# Patient Record
Sex: Female | Born: 1985 | Race: White | Hispanic: No | Marital: Single | Smoking: Never smoker
Health system: Southern US, Community
[De-identification: ages and names within clinical notes are randomized; demographics above are authoritative.]

## PROBLEM LIST (undated history)

## (undated) ENCOUNTER — Emergency Department: Admission: EM | Payer: Self-pay | Source: Home / Self Care

## (undated) DIAGNOSIS — G709 Myoneural disorder, unspecified: Secondary | ICD-10-CM

## (undated) DIAGNOSIS — R0600 Dyspnea, unspecified: Secondary | ICD-10-CM

## (undated) DIAGNOSIS — R519 Headache, unspecified: Secondary | ICD-10-CM

## (undated) DIAGNOSIS — C720 Malignant neoplasm of spinal cord: Secondary | ICD-10-CM

## (undated) DIAGNOSIS — Z8041 Family history of malignant neoplasm of ovary: Secondary | ICD-10-CM

## (undated) DIAGNOSIS — O039 Complete or unspecified spontaneous abortion without complication: Secondary | ICD-10-CM

## (undated) DIAGNOSIS — F419 Anxiety disorder, unspecified: Secondary | ICD-10-CM

## (undated) DIAGNOSIS — N809 Endometriosis, unspecified: Secondary | ICD-10-CM

## (undated) DIAGNOSIS — Z6791 Unspecified blood type, Rh negative: Secondary | ICD-10-CM

## (undated) DIAGNOSIS — C719 Malignant neoplasm of brain, unspecified: Secondary | ICD-10-CM

## (undated) DIAGNOSIS — K219 Gastro-esophageal reflux disease without esophagitis: Secondary | ICD-10-CM

## (undated) DIAGNOSIS — R51 Headache: Secondary | ICD-10-CM

## (undated) DIAGNOSIS — Z1379 Encounter for other screening for genetic and chromosomal anomalies: Secondary | ICD-10-CM

## (undated) DIAGNOSIS — A6000 Herpesviral infection of urogenital system, unspecified: Secondary | ICD-10-CM

## (undated) HISTORY — DX: Gastro-esophageal reflux disease without esophagitis: K21.9

## (undated) HISTORY — DX: Complete or unspecified spontaneous abortion without complication: O03.9

## (undated) HISTORY — DX: Herpesviral infection of urogenital system, unspecified: A60.00

## (undated) HISTORY — PX: DILATION AND CURETTAGE OF UTERUS: SHX78

## (undated) HISTORY — DX: Endometriosis, unspecified: N80.9

## (undated) HISTORY — DX: Unspecified blood type, rh negative: Z67.91

## (undated) HISTORY — DX: Encounter for other screening for genetic and chromosomal anomalies: Z13.79

## (undated) HISTORY — DX: Family history of malignant neoplasm of ovary: Z80.41

## (undated) HISTORY — DX: Malignant neoplasm of brain, unspecified: C71.9

## (undated) HISTORY — PX: BRAIN TUMOR EXCISION: SHX577

## (undated) HISTORY — DX: Malignant neoplasm of spinal cord: C72.0

---
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
## 2007-02-28 ENCOUNTER — Emergency Department: Payer: Self-pay

## 2007-06-30 ENCOUNTER — Observation Stay: Payer: Self-pay

## 2007-07-01 ENCOUNTER — Ambulatory Visit: Payer: Self-pay

## 2007-07-04 ENCOUNTER — Observation Stay: Payer: Self-pay | Admitting: Obstetrics & Gynecology

## 2007-07-22 ENCOUNTER — Inpatient Hospital Stay: Payer: Self-pay | Admitting: Obstetrics and Gynecology

## 2007-08-01 ENCOUNTER — Emergency Department: Payer: Self-pay | Admitting: Emergency Medicine

## 2007-08-03 ENCOUNTER — Ambulatory Visit: Payer: Self-pay | Admitting: Obstetrics and Gynecology

## 2007-08-06 ENCOUNTER — Ambulatory Visit: Payer: Self-pay | Admitting: Obstetrics and Gynecology

## 2007-11-20 ENCOUNTER — Emergency Department: Payer: Self-pay | Admitting: Emergency Medicine

## 2007-12-07 ENCOUNTER — Emergency Department: Payer: Self-pay | Admitting: Emergency Medicine

## 2008-06-13 ENCOUNTER — Emergency Department: Payer: Self-pay | Admitting: Emergency Medicine

## 2008-09-15 ENCOUNTER — Emergency Department: Payer: Self-pay | Admitting: Emergency Medicine

## 2008-09-20 DIAGNOSIS — K219 Gastro-esophageal reflux disease without esophagitis: Secondary | ICD-10-CM

## 2008-09-20 HISTORY — DX: Gastro-esophageal reflux disease without esophagitis: K21.9

## 2009-06-14 DIAGNOSIS — L700 Acne vulgaris: Secondary | ICD-10-CM | POA: Insufficient documentation

## 2009-06-14 DIAGNOSIS — R51 Headache: Secondary | ICD-10-CM

## 2009-10-18 ENCOUNTER — Emergency Department: Payer: Self-pay | Admitting: Emergency Medicine

## 2009-11-17 ENCOUNTER — Emergency Department: Payer: Self-pay | Admitting: Emergency Medicine

## 2009-12-20 ENCOUNTER — Emergency Department: Payer: Self-pay | Admitting: Emergency Medicine

## 2010-02-09 ENCOUNTER — Emergency Department: Payer: Self-pay | Admitting: Emergency Medicine

## 2010-02-12 ENCOUNTER — Emergency Department: Payer: Self-pay | Admitting: Emergency Medicine

## 2010-02-13 ENCOUNTER — Emergency Department: Payer: Self-pay | Admitting: Internal Medicine

## 2010-04-13 ENCOUNTER — Emergency Department: Payer: Self-pay | Admitting: Emergency Medicine

## 2010-04-24 ENCOUNTER — Ambulatory Visit: Payer: Self-pay | Admitting: Obstetrics and Gynecology

## 2010-04-26 ENCOUNTER — Ambulatory Visit: Payer: Self-pay | Admitting: Obstetrics and Gynecology

## 2010-04-26 HISTORY — PX: DIAGNOSTIC LAPAROSCOPY: SUR761

## 2010-04-26 HISTORY — PX: HYSTEROSCOPY: SHX211

## 2010-06-14 ENCOUNTER — Emergency Department: Payer: Self-pay | Admitting: Emergency Medicine

## 2010-06-25 ENCOUNTER — Emergency Department: Payer: Self-pay | Admitting: Emergency Medicine

## 2010-06-30 ENCOUNTER — Emergency Department: Payer: Self-pay | Admitting: Emergency Medicine

## 2010-07-21 ENCOUNTER — Emergency Department: Payer: Self-pay | Admitting: Emergency Medicine

## 2010-08-04 ENCOUNTER — Emergency Department: Payer: Self-pay | Admitting: Emergency Medicine

## 2010-08-08 ENCOUNTER — Other Ambulatory Visit: Payer: Self-pay | Admitting: Obstetrics and Gynecology

## 2010-08-10 ENCOUNTER — Ambulatory Visit: Payer: Self-pay

## 2010-09-03 LAB — PATHOLOGY REPORT

## 2010-09-26 ENCOUNTER — Emergency Department: Payer: Self-pay | Admitting: Emergency Medicine

## 2010-09-28 ENCOUNTER — Emergency Department: Payer: Self-pay | Admitting: Emergency Medicine

## 2010-11-16 ENCOUNTER — Emergency Department: Payer: Self-pay | Admitting: Emergency Medicine

## 2010-12-05 ENCOUNTER — Emergency Department: Payer: Self-pay | Admitting: Emergency Medicine

## 2011-01-11 ENCOUNTER — Emergency Department: Payer: Self-pay | Admitting: Emergency Medicine

## 2011-03-15 ENCOUNTER — Emergency Department: Payer: Self-pay | Admitting: Emergency Medicine

## 2011-03-27 ENCOUNTER — Emergency Department: Payer: Self-pay | Admitting: Emergency Medicine

## 2011-05-26 ENCOUNTER — Emergency Department: Payer: Self-pay | Admitting: Emergency Medicine

## 2011-06-06 ENCOUNTER — Emergency Department: Payer: Self-pay | Admitting: Emergency Medicine

## 2011-07-02 ENCOUNTER — Emergency Department: Payer: Self-pay | Admitting: Emergency Medicine

## 2011-07-22 ENCOUNTER — Inpatient Hospital Stay: Payer: Self-pay | Admitting: Surgery

## 2011-07-27 ENCOUNTER — Emergency Department: Payer: Self-pay | Admitting: *Deleted

## 2011-08-26 ENCOUNTER — Emergency Department: Payer: Self-pay | Admitting: Emergency Medicine

## 2011-09-17 ENCOUNTER — Observation Stay: Payer: Self-pay

## 2011-10-27 ENCOUNTER — Observation Stay: Payer: Self-pay | Admitting: Obstetrics and Gynecology

## 2011-11-03 ENCOUNTER — Observation Stay: Payer: Self-pay | Admitting: Obstetrics and Gynecology

## 2011-11-04 ENCOUNTER — Observation Stay: Payer: Self-pay

## 2011-11-04 ENCOUNTER — Inpatient Hospital Stay (HOSPITAL_COMMUNITY): Payer: Medicaid Other

## 2011-11-04 ENCOUNTER — Encounter (HOSPITAL_COMMUNITY): Payer: Self-pay | Admitting: *Deleted

## 2011-11-04 ENCOUNTER — Observation Stay (HOSPITAL_COMMUNITY)
Admission: AD | Admit: 2011-11-04 | Discharge: 2011-11-05 | Disposition: A | Discharge status: Home / Self Care | Payer: Medicaid Other | Source: Ambulatory Visit | Attending: Obstetrics & Gynecology | Admitting: Obstetrics & Gynecology

## 2011-11-04 DIAGNOSIS — O4290 Premature rupture of membranes, unspecified as to length of time between rupture and onset of labor, unspecified weeks of gestation: Principal | ICD-10-CM | POA: Insufficient documentation

## 2011-11-04 DIAGNOSIS — Z22330 Carrier of Group B streptococcus: Secondary | ICD-10-CM | POA: Insufficient documentation

## 2011-11-04 DIAGNOSIS — O4700 False labor before 37 completed weeks of gestation, unspecified trimester: Secondary | ICD-10-CM | POA: Insufficient documentation

## 2011-11-04 DIAGNOSIS — O99891 Other specified diseases and conditions complicating pregnancy: Secondary | ICD-10-CM | POA: Insufficient documentation

## 2011-11-04 DIAGNOSIS — O42919 Preterm premature rupture of membranes, unspecified as to length of time between rupture and onset of labor, unspecified trimester: Secondary | ICD-10-CM

## 2011-11-04 DIAGNOSIS — O9989 Other specified diseases and conditions complicating pregnancy, childbirth and the puerperium: Secondary | ICD-10-CM

## 2011-11-04 LAB — AMNISURE RUPTURE OF MEMBRANE (ROM) NOT AT ARMC: Amnisure ROM: NEGATIVE

## 2011-11-04 LAB — HIV ANTIBODY (ROUTINE TESTING W REFLEX): HIV: NONREACTIVE

## 2011-11-04 LAB — RPR: RPR: NONREACTIVE

## 2011-11-04 LAB — HEPATITIS B SURFACE ANTIGEN: Hepatitis B Surface Ag: NEGATIVE

## 2011-11-04 LAB — RUBELLA ANTIBODY, IGM: Rubella: IMMUNE

## 2011-11-04 MED ORDER — CALCIUM CARBONATE ANTACID 500 MG PO CHEW: 2.0000 | CHEWABLE_TABLET | ORAL | Status: DC | PRN

## 2011-11-04 MED ORDER — ACETAMINOPHEN 325 MG PO TABS: 650.0000 mg | ORAL_TABLET | ORAL | Status: DC | PRN

## 2011-11-04 MED ORDER — SODIUM CHLORIDE 0.9 % IV SOLN
2.0000 g | Freq: Four times a day (QID) | INTRAVENOUS | Status: DC
  Administered 2011-11-04 – 2011-11-05 (×4): 2 g via INTRAVENOUS
  Filled 2011-11-04 (×6): qty 2000

## 2011-11-04 MED ORDER — ZOLPIDEM TARTRATE 10 MG PO TABS: 10.0000 mg | ORAL_TABLET | Freq: Every evening | ORAL | Status: DC | PRN

## 2011-11-04 MED ORDER — DOCUSATE SODIUM 100 MG PO CAPS
100.0000 mg | ORAL_CAPSULE | Freq: Every day | ORAL | Status: DC
  Administered 2011-11-04 – 2011-11-05 (×2): 100 mg via ORAL
  Filled 2011-11-04 (×2): qty 1

## 2011-11-04 MED ORDER — PRENATAL PLUS 27-1 MG PO TABS: 1.0000 | ORAL_TABLET | Freq: Every day | ORAL | Status: DC

## 2011-11-04 MED ORDER — LACTATED RINGERS IV SOLN
INTRAVENOUS | Status: DC
  Administered 2011-11-04 – 2011-11-05 (×3): via INTRAVENOUS

## 2011-11-04 MED ORDER — BETAMETHASONE SOD PHOS & ACET 6 (3-3) MG/ML IJ SUSP: 12.0000 mg | INTRAMUSCULAR | Status: DC

## 2011-11-04 MED ORDER — AMOXICILLIN 500 MG PO CAPS: 500.0000 mg | ORAL_CAPSULE | Freq: Three times a day (TID) | ORAL | Status: DC

## 2011-11-04 MED ORDER — BETAMETHASONE SOD PHOS & ACET 6 (3-3) MG/ML IJ SUSP
12.0000 mg | Freq: Once | INTRAMUSCULAR | Status: AC
  Administered 2011-11-05: 12 mg via INTRAMUSCULAR
  Filled 2011-11-04: qty 2

## 2011-11-04 MED ORDER — ERYTHROMYCIN BASE 250 MG PO TABS: 250.0000 mg | ORAL_TABLET | Freq: Four times a day (QID) | ORAL | Status: DC

## 2011-11-04 MED ORDER — SODIUM CHLORIDE 0.9 % IV SOLN
250.0000 mg | Freq: Four times a day (QID) | INTRAVENOUS | Status: DC
  Administered 2011-11-04 – 2011-11-05 (×4): 250 mg via INTRAVENOUS
  Filled 2011-11-04 (×8): qty 250

## 2011-11-04 MED ORDER — DOCUSATE SODIUM 100 MG PO CAPS: 100.0000 mg | ORAL_CAPSULE | Freq: Every day | ORAL | Status: DC

## 2011-11-04 MED ORDER — MAGNESIUM SULFATE 40 G IN LACTATED RINGERS - SIMPLE
2.0000 g/h | INTRAVENOUS | Status: AC
  Administered 2011-11-04: 2 g/h via INTRAVENOUS
  Filled 2011-11-04: qty 500

## 2011-11-04 MED ORDER — PRENATAL PLUS 27-1 MG PO TABS
1.0000 | ORAL_TABLET | Freq: Every day | ORAL | Status: DC
  Administered 2011-11-04 – 2011-11-05 (×2): 1 via ORAL
  Filled 2011-11-04 (×2): qty 1

## 2011-11-04 NOTE — Progress Notes (Signed)
Pt states contractions are becoming more frequent and stronger. Unable to palpate contractions. Toco readjusted. Dr Durene Cal notified and made aware. Stated he would check on pt later.

## 2011-11-04 NOTE — Progress Notes (Signed)
IV removed per pt request, pt asking for a new IV site.

## 2011-11-04 NOTE — Progress Notes (Signed)
Updated on [place] results

## 2011-11-04 NOTE — Plan of Care (Signed)
Problem: Consults Goal: Birthing Suites Patient Information Press F2 to bring up selections list Outcome: Completed/Met Date Met:  11/04/11  Pt < [redacted] weeks EGA
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
## 2011-11-04 NOTE — Progress Notes (Signed)
Patient ID: BELKY MUNDO, female   DOB: 1985-12-09, 25 y.o.   MRN: [id number] FACULTY PRACTICE ANTEPARTUM(COMPREHENSIVE) NOTE  Denise Bryan is a 25 y.o. [id number] at [redacted]w[redacted]d by early ultrasound who is admitted for PROM.   Fetal presentation is cephalic. Length of Stay:  0  Days  Subjective: Occcasional mucus of watery discharge, no contractions Patient reports the fetal movement as active. Patient reports uterine contraction  activity as none. Patient reports  vaginal bleeding as none. Patient describes fluid per vagina as Clear, mucus  Vitals:  Blood pressure 103/62, pulse 107, temperature 98.5 F (36.9 C), temperature source Oral, resp. rate 20, height 5\' 3"  (1.6 m), weight 152 lb (68.947 kg), SpO2 96.00%. Physical Examination:  General appearance - alert, well appearing, and in no distress Heart - normal rate and regular rhythm Abdomen - soft, nontender, nondistended Fundal Height:  size equals dates Cervical Exam: Evaluated by sterile speculum exam. and found to be <0.5 cm/ Long/not palpated, cephalic by [place] and fetal presentation is cephalic. Extremities: extremities normal, atraumatic, no cyanosis or edema and Homans sign is negative, no sign of DVT with DTRs 2+ bilaterally Membranes:intact, sterile speculum exam reported neg fern, neg amnisure, no pool, white mucus seen  Fetal Monitoring:  Baseline: 150 bpm and no decel  Labs:  Recent Results (from the past 24 hour(s))  ANTIBODY SCREEN      Component Value Range   Antibody Screen Negative    HIV ANTIBODY (ROUTINE TESTING)      Component Value Range   HIV Non-reactive    ABO/RH      Component Value Range   RH-Type Negative     ABO Grouping B    STREP B DNA PROBE      Component Value Range   Group B Strep Ag Positive    RUBELLA ANTIBODY, IGM      Component Value Range   Rubella Immune    HEPATITIS B SURFACE ANTIGEN      Component Value Range   Hepatitis B Surface Ag Negative    RPR      Component Value  Range   RPR Nonreactive    AMNISURE RUPTURE OF MEMBRANE (ROM)   Collection Time   11/04/11  7:45 PM      Component Value Range   Amnisure ROM NEGATIVE      Imaging Studies:    Normal afv, cephalic, cervix closed, 3.8 cm length Medications:  Scheduled    . ampicillin (OMNIPEN) IV  2 g Intravenous Q6H   Followed by  . amoxicillin  500 mg Oral Q8H  . betamethasone acetate-betamethasone sodium phosphate  12 mg Intramuscular Once  . docusate sodium  100 mg Oral Daily  . erythromycin  250 mg Intravenous Q6H   Followed by  . erythromycin  250 mg Oral Q6H  . prenatal vitamin w/FE, FA  1 tablet Oral Daily  . DISCONTD: betamethasone acetate-betamethasone sodium phosphate  12 mg Intramuscular Q24H  . DISCONTD: docusate sodium  100 mg Oral Daily  . DISCONTD: prenatal vitamin w/FE, FA  1 tablet Oral Daily   I have reviewed the patient's current medications.  ASSESSMENT: Pt referred for evaluation of possible preterm premature ROM, with testing that suggest membranes are intact  PLAN: If no further suspicion of ROM in am consider d/c abx, magnesium. Will complete second dose of betamethasone.   [name] 11/04/2011,8:55 PM

## 2011-11-05 NOTE — Discharge Summary (Signed)
  Physician Discharge Summary  Patient ID: Denise Bryan MRN: [id number] DOB/AGE: [birth year] 25 y.o.  Admit date: 11/04/2011 Discharge date: 11/05/2011  Admission Diagnoses: ?PPROM  Discharge Diagnoses:  Active Problems:  * No active hospital problems. *  PPROM ruled out.   Discharged Condition: good  Hospital Course: Pt was transferred from an outside hospital with suspicion of rupture of membranes.  She reported contractions.  She was initially monitored on continuous tocometer, continued on magnesium (which had been started at OSH) and given a course of antibiotics. She was evaluated by sterile speculum exam. and found to be <0.5 cm/ Long/not palpated, cephalic by [place] and fetal presentation is cephalic with membranes intact, sterile speculum exam reported neg fern, neg amnisure, no pool, white mucus seen.  Once it was determined that that pt did not have ruptured membranes, continuous toco was stopped.  She was monitored intermittently from that time until discharge.  Pt did well,  And was discharged home with instructions to f/u with primary OB this week.    Consults: none  Significant Diagnostic Studies: Complete US with AFI - cervical length of 3.87cm, single living intrauterine gestation with concordant gestational age and normal visualize anatomy.  Normal amniotic fluid volume (18.1cm, 69%tile).    Treatments: antibiotics: amoxicillin, ampicillin;  Tocolytics: Magnesiumx12hrs;  Betamethasone x1 (to complete course started at outside hospital)  Discharge Exam: Blood pressure 94/49, pulse 102, temperature 98.5 F (36.9 C), temperature source Oral, resp. rate 18, height 5\' 3"  (1.6 m), weight 68.947 kg (152 lb), SpO2 96.00%.   Disposition: Discharged home  Discharge Orders    Future Orders Please Complete By Expires   Discharge patient      Strep B DNA probe      Comments:   This external order was created through the Results Console.   HIV antibody      Comments:    This external order was created through the Results Console.   Rubella antibody, IgM      Comments:   This external order was created through the Results Console.   Hepatitis B surface antigen      Comments:   This external order was created through the Results Console.   RPR      Comments:   This external order was created through the Results Console.   Antibody screen      Comments:   This external order was created through the Results Console.   ABO/Rh      Comments:   This external order was created through the Results Console.     Medication List  As of 11/05/2011  5:20 PM   CONTINUE taking these medications         acetaminophen 500 MG tablet   Commonly known as: TYLENOL      prenatal vitamin w/FE, FA 27-1 MG Tabs           Follow-up Information    Follow up with [facility] on 11/07/2011. (Please call or return to care earlier  as needed)          Signed: Levada Schilling 11/05/2011, 5:20 PM  Discussed with Philipp Deputy, CNM

## 2011-11-05 NOTE — Progress Notes (Signed)
Patient ID: Denise Bryan, female   DOB: 07-Feb-1986, 25 y.o.   MRN: [id number] FACULTY PRACTICE ANTEPARTUM(COMPREHENSIVE) NOTE  Denise Bryan is a 25 y.o. [id number] at [redacted]w[redacted]d by early ultrasound who is admitted for PROM.   Fetal presentation is cephalic. Length of Stay:  1  Days  Subjective: Occasional contractions Patient reports the fetal movement as active. Patient reports uterine contraction  activity as irregular. Patient reports  vaginal bleeding as none. Patient describes fluid per vagina as None.  Vitals:  Blood pressure 99/60, pulse 100, temperature 97.8 F (36.6 C), temperature source Oral, resp. rate 18, height 5\' 3"  (1.6 m), weight 152 lb (68.947 kg), SpO2 96.00%. Physical Examination:  General appearance - alert, well appearing, and in no distress Heart - normal rate and regular rhythm Abdomen - soft, nontender, nondistended Fundal Height:  size equals dates Cervical Exam: Not evaluated. and found to be not evaluated/ not checked/not checked and fetal presentation is cephalic. Extremities: extremities normal, atraumatic, no cyanosis or edema and Homans sign is negative, no sign of DVT with DTRs 2+ bilaterally Membranes:intact  Fetal Monitoring:  Baseline: 125-135 bpm  Labs:  Recent Results (from the past 24 hour(s))  AMNISURE RUPTURE OF MEMBRANE (ROM)   Collection Time   11/04/11  7:45 PM      Component Value Range   Amnisure ROM NEGATIVE      Imaging Studies:    [place] reviewed. Currently EPIC will not allow sonographic studies to automatically populate into notes.  In the meantime, copy and paste results into note or free text.  Medications:  Scheduled    . ampicillin (OMNIPEN) IV  2 g Intravenous Q6H   Followed by  . amoxicillin  500 mg Oral Q8H  . betamethasone acetate-betamethasone sodium phosphate  12 mg Intramuscular Once  . docusate sodium  100 mg Oral Daily  . erythromycin  250 mg Intravenous Q6H   Followed by  . erythromycin  250 mg Oral Q6H    . prenatal vitamin w/FE, FA  1 tablet Oral Daily  . DISCONTD: betamethasone acetate-betamethasone sodium phosphate  12 mg Intramuscular Q24H  . DISCONTD: docusate sodium  100 mg Oral Daily  . DISCONTD: prenatal vitamin w/FE, FA  1 tablet Oral Daily   I have reviewed the patient's current medications.  ASSESSMENT: Patient Active Problem List  Diagnoses  . Preterm premature rupture of membranes   No current evidence of ROM2 PLAN: Discontinue magnesium IV  ARNOLD,JAMES 11/05/2011,6:52 AM

## 2011-11-07 ENCOUNTER — Observation Stay: Payer: Self-pay

## 2011-11-07 NOTE — H&P (Signed)
Agree with note by the resident

## 2011-11-07 NOTE — Discharge Summary (Signed)
Attestation of Attending Supervision of Resident: Evaluation and management procedures were performed by the [facility] Resident under my supervision.  I have reviewed the resident's note, chart reviewed and agree with management and plan.  Jaynie Collins, M.D. 11/07/2011 11:32 AM

## 2011-11-17 ENCOUNTER — Observation Stay: Payer: Self-pay | Admitting: Obstetrics & Gynecology

## 2011-11-26 ENCOUNTER — Observation Stay: Payer: Self-pay | Admitting: Obstetrics and Gynecology

## 2011-11-27 ENCOUNTER — Observation Stay: Payer: Self-pay

## 2011-12-01 ENCOUNTER — Observation Stay: Payer: Self-pay | Admitting: *Deleted

## 2011-12-16 ENCOUNTER — Inpatient Hospital Stay: Payer: Self-pay

## 2012-01-03 ENCOUNTER — Observation Stay: Payer: Self-pay | Admitting: Obstetrics and Gynecology

## 2012-01-07 ENCOUNTER — Observation Stay: Payer: Self-pay

## 2012-01-11 ENCOUNTER — Observation Stay: Payer: Self-pay

## 2012-01-17 HISTORY — PX: TUBAL LIGATION: SHX77

## 2012-01-21 ENCOUNTER — Inpatient Hospital Stay: Payer: Self-pay | Admitting: Obstetrics and Gynecology

## 2012-01-21 LAB — CBC WITH DIFFERENTIAL/PLATELET
Basophil #: 0 10*3/uL (ref 0.0–0.1)
Basophil %: 0.1 %
Eosinophil #: 0.1 10*3/uL (ref 0.0–0.7)
Eosinophil %: 0.7 %
Lymphocyte #: 1.5 10*3/uL (ref 1.0–3.6)
Lymphocyte %: 20.6 %
MCH: 32.5 pg (ref 26.0–34.0)
MCHC: 33.8 g/dL (ref 32.0–36.0)
MCV: 96 fL (ref 80–100)
Monocyte #: 0.5 10*3/uL (ref 0.0–0.7)
Platelet: 123 10*3/uL — ABNORMAL LOW (ref 150–440)
RBC: 3.64 10*6/uL — ABNORMAL LOW (ref 3.80–5.20)
RDW: 12.4 % (ref 11.5–14.5)

## 2012-01-22 LAB — HEMATOCRIT: HCT: 36.3 % (ref 35.0–47.0)

## 2012-01-24 LAB — PATHOLOGY REPORT

## 2012-02-17 ENCOUNTER — Emergency Department: Payer: Self-pay | Admitting: *Deleted

## 2012-03-12 ENCOUNTER — Emergency Department: Payer: Self-pay | Admitting: Internal Medicine

## 2012-04-02 DIAGNOSIS — S139XXA Sprain of joints and ligaments of unspecified parts of neck, initial encounter: Secondary | ICD-10-CM | POA: Insufficient documentation

## 2012-08-16 IMAGING — CR LEFT WRIST - COMPLETE 3+ VIEW
1 series · 4 of 4 positions shown · non-contrast
Comparison: none

REASON FOR EXAM: wrist pain
COMMENTS:   May transport without cardiac monitor

PROCEDURE:     DXR - DXR WRIST LT COMP WITH OBLIQUES  - September 26, 2010  [DATE]
RESULT:     No fracture, dislocation or other acute bony abnormality is
identified.

[Series 1: view not recorded · 0.17mm/px · 4 of 4 slices shown]
[im 1/4]
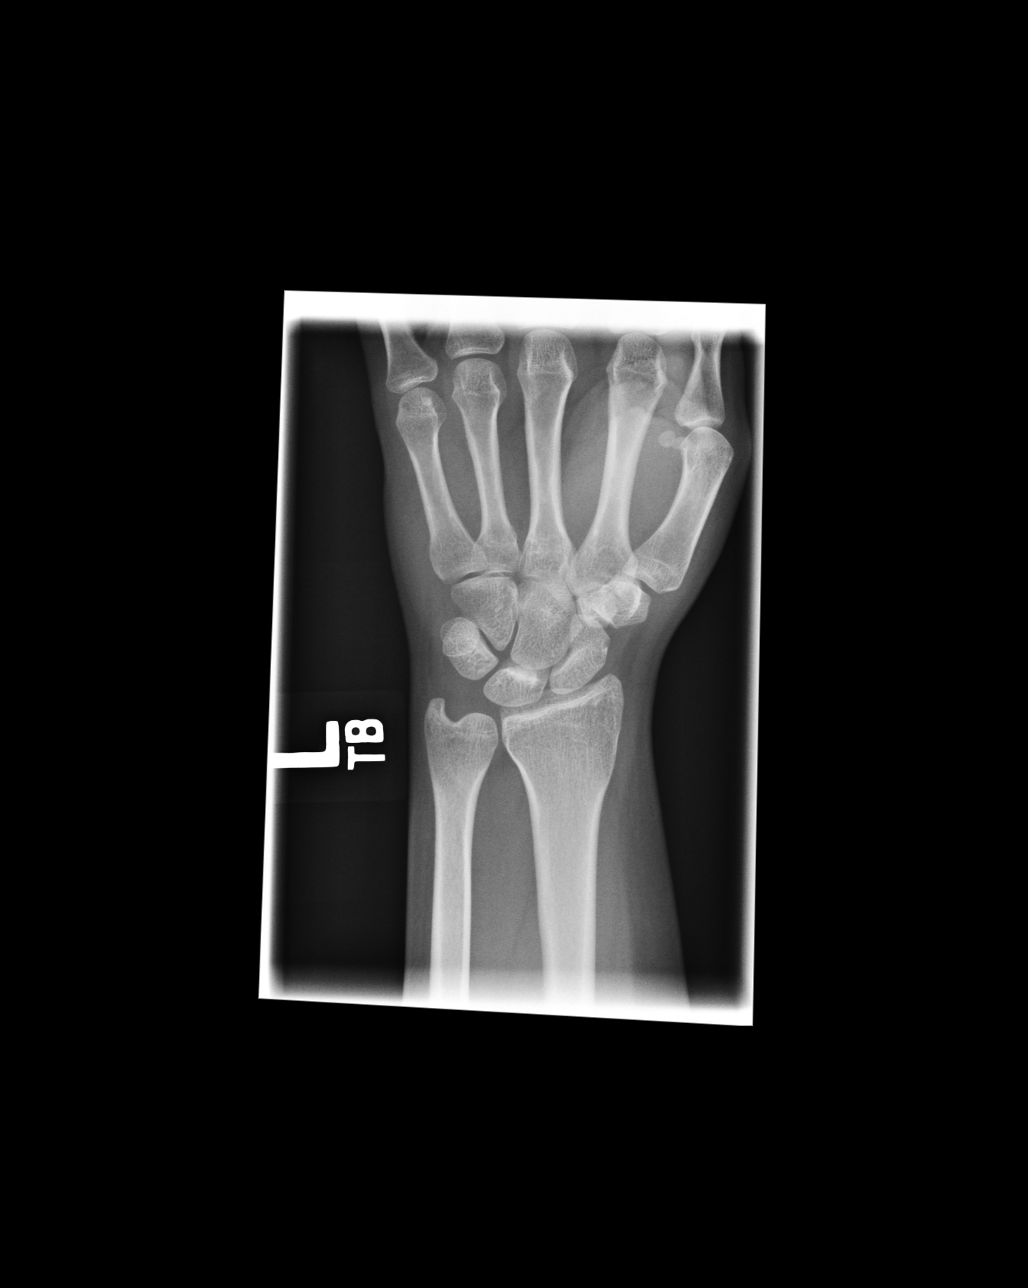
[im 2/4]
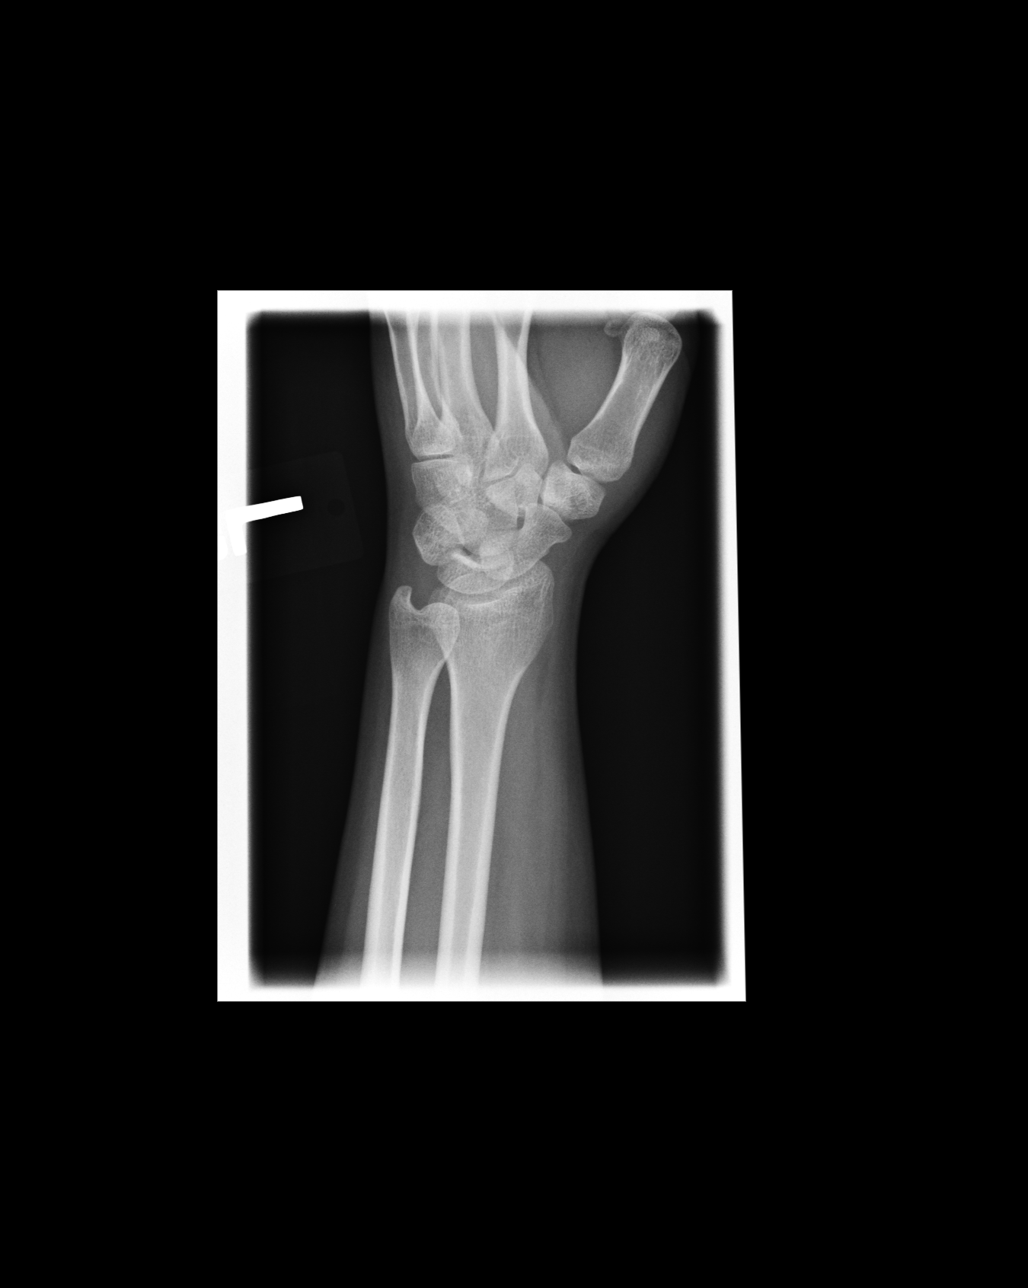
[im 3/4]
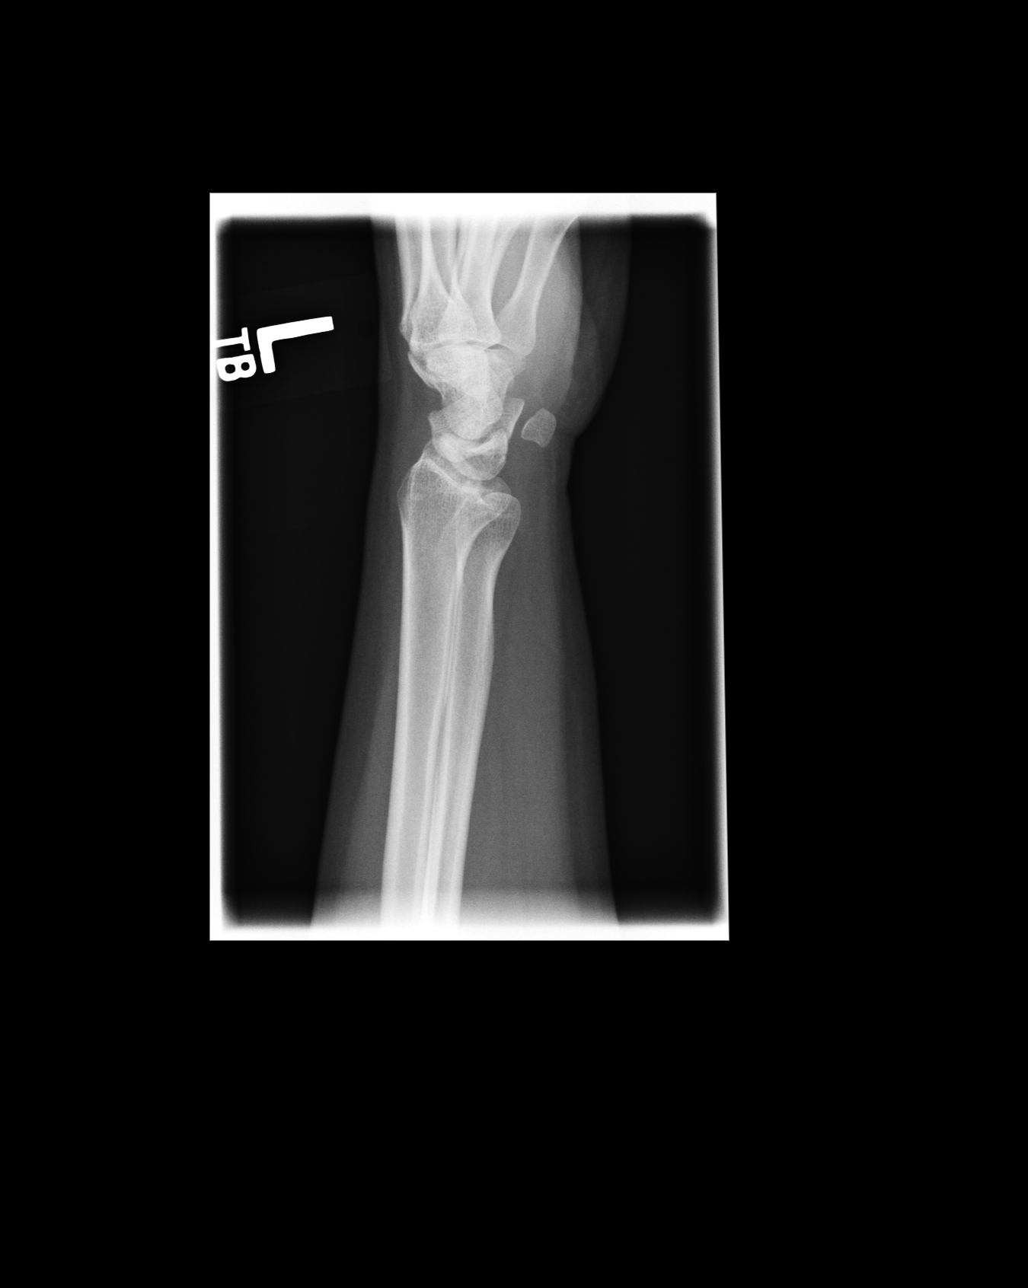
[im 4/4]
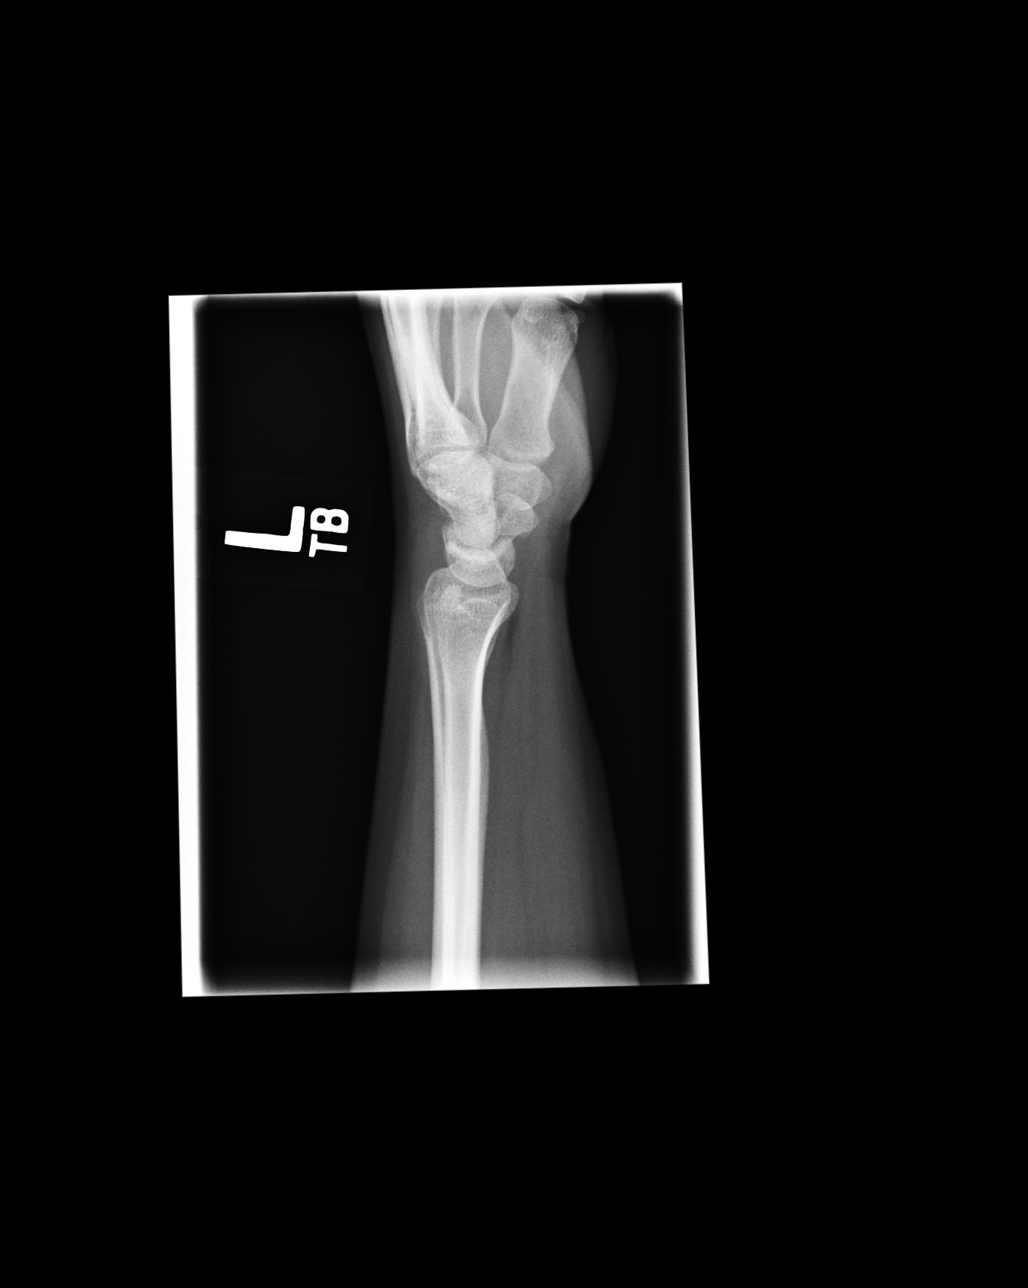

[4 of 4 positions shown; findings below may reference images not displayed]

IMPRESSION: 1.     No significant osseous abnormalities are noted.

## 2012-12-17 ENCOUNTER — Emergency Department: Payer: Self-pay | Admitting: Emergency Medicine

## 2012-12-22 ENCOUNTER — Emergency Department: Payer: Self-pay | Admitting: Emergency Medicine

## 2012-12-22 LAB — COMPREHENSIVE METABOLIC PANEL
Albumin: 4.5 g/dL (ref 3.4–5.0)
Alkaline Phosphatase: 105 U/L (ref 50–136)
Anion Gap: 5 — ABNORMAL LOW (ref 7–16)
BUN: 10 mg/dL (ref 7–18)
Calcium, Total: 9.1 mg/dL (ref 8.5–10.1)
Co2: 27 mmol/L (ref 21–32)
Creatinine: 0.52 mg/dL — ABNORMAL LOW (ref 0.60–1.30)
EGFR (African American): >60
EGFR (Non-African Amer.): >60
Osmolality: 272 (ref 275–301)
Potassium: 4.1 mmol/L (ref 3.5–5.1)
SGOT(AST): 24 U/L (ref 15–37)
SGPT (ALT): 30 U/L (ref 12–78)
Sodium: 137 mmol/L (ref 136–145)
Total Protein: 8 g/dL (ref 6.4–8.2)

## 2012-12-22 LAB — URINALYSIS, COMPLETE
Bacteria: NONE SEEN
Bilirubin,UR: NEGATIVE
Glucose,UR: NEGATIVE mg/dL (ref 0–75)
Ketone: NEGATIVE
Specific Gravity: 1.016 (ref 1.003–1.030)
Squamous Epithelial: 7

## 2012-12-22 LAB — CBC
HCT: 41.3 % (ref 35.0–47.0)
HGB: 14.5 g/dL (ref 12.0–16.0)
MCV: 89 fL (ref 80–100)
Platelet: 328 10*3/uL (ref 150–440)
RDW: 12.7 % (ref 11.5–14.5)
WBC: 10.5 10*3/uL (ref 3.6–11.0)

## 2012-12-22 LAB — LIPASE, BLOOD: Lipase: 142 U/L (ref 73–393)

## 2012-12-23 LAB — URINE CULTURE

## 2013-04-15 ENCOUNTER — Emergency Department: Payer: Self-pay | Admitting: Emergency Medicine

## 2013-06-11 IMAGING — US ABDOMEN ULTRASOUND
1 series · 17 of 25 positions shown · non-contrast
Comparison: none

REASON FOR EXAM: RUQ pain
COMMENTS:   May transport without cardiac monitor

PROCEDURE:     US  - US ABDOMEN GENERAL SURVEY  - July 22, 2011  [DATE]
RESULT:     Comparison: None
TECHNIQUE: Multiple gray-scale and color-flow Doppler images of the abdomen
are presented for review.

[Series 1: abdomen ultrasound · 17 of 64 slices shown]
[im 1/64]
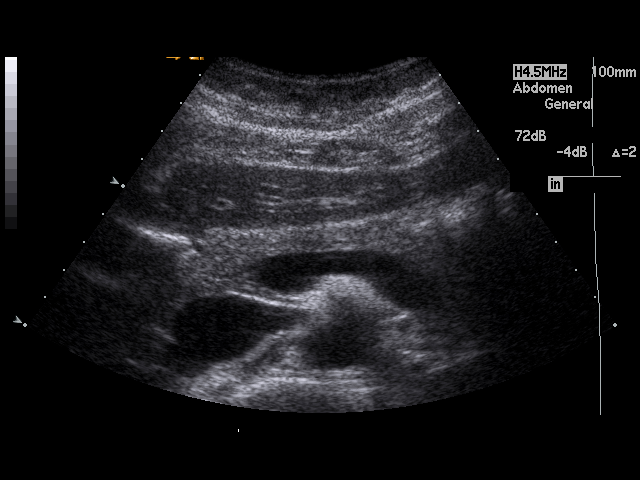
[im 6/64]
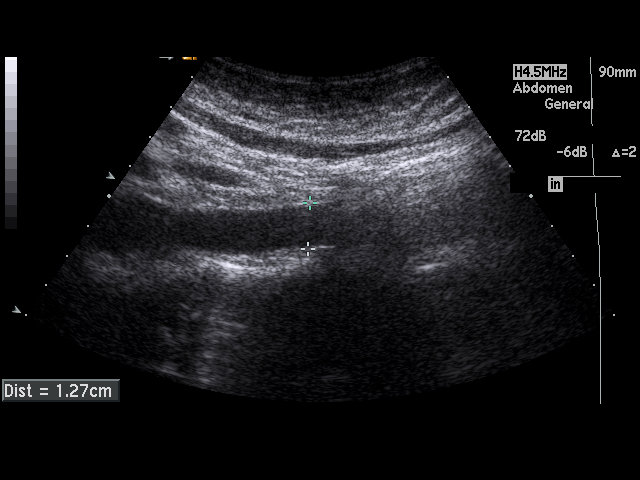
[im 8/64]
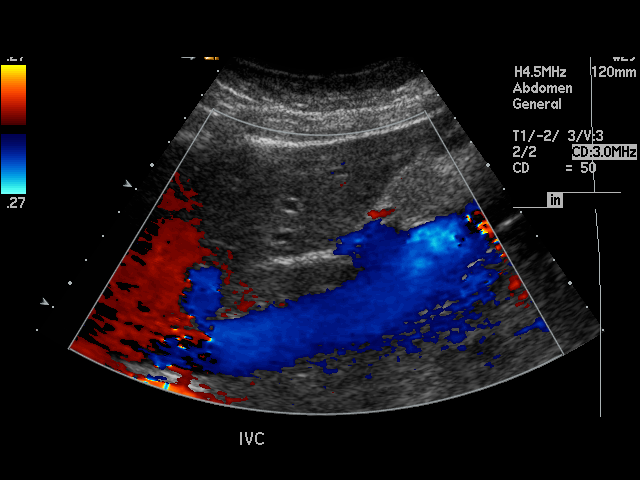
[im 14/64]
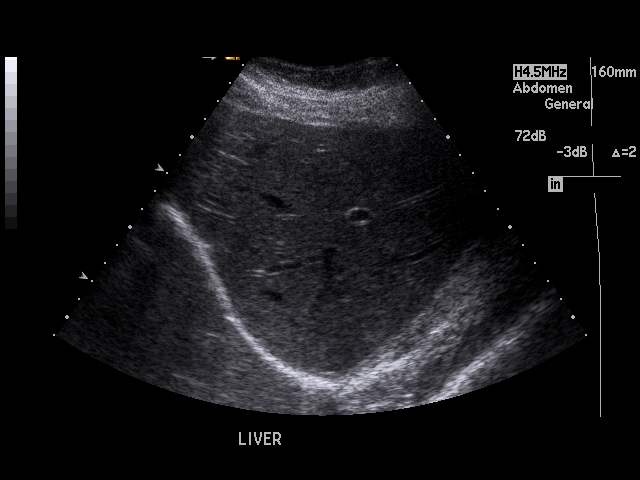
[im 16/64]
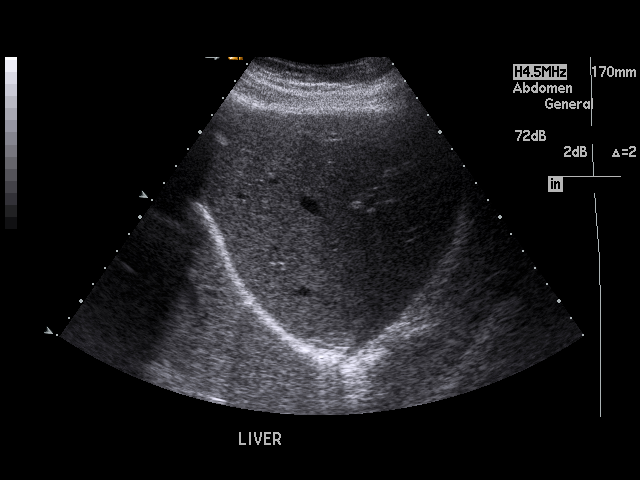
[im 22/64]
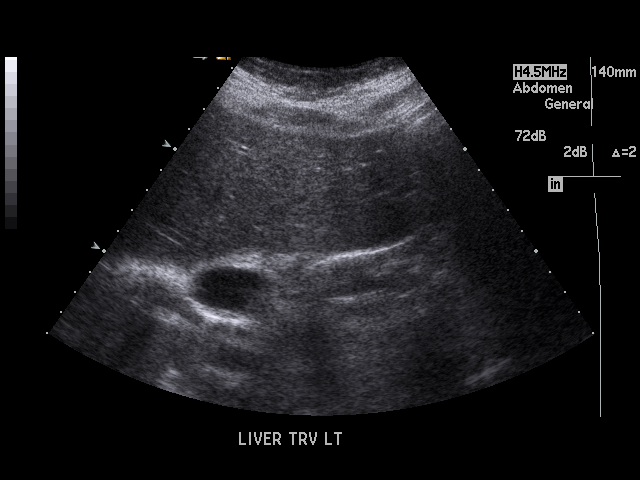
[im 24/64]
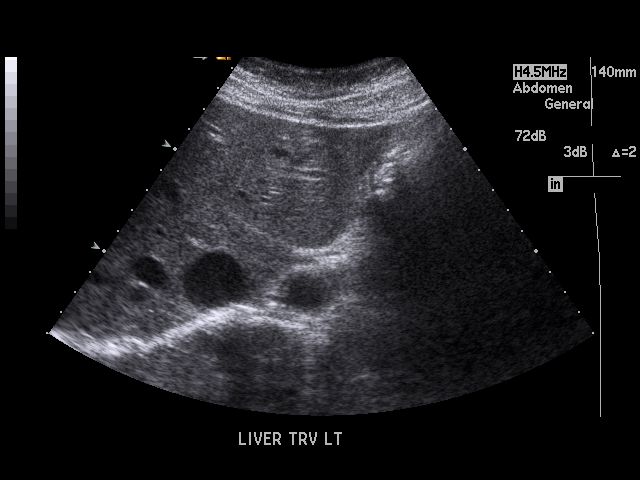
[im 29/64]
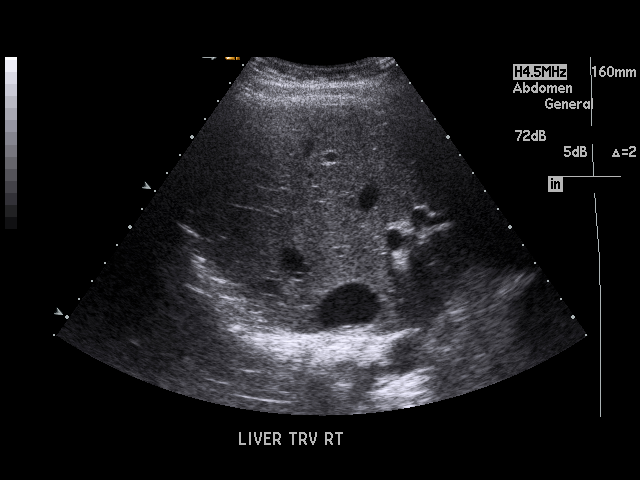
[im 32/64]
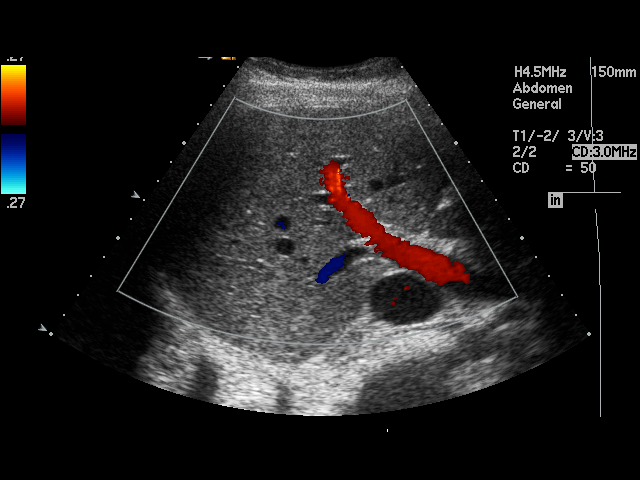
[im 35/64]
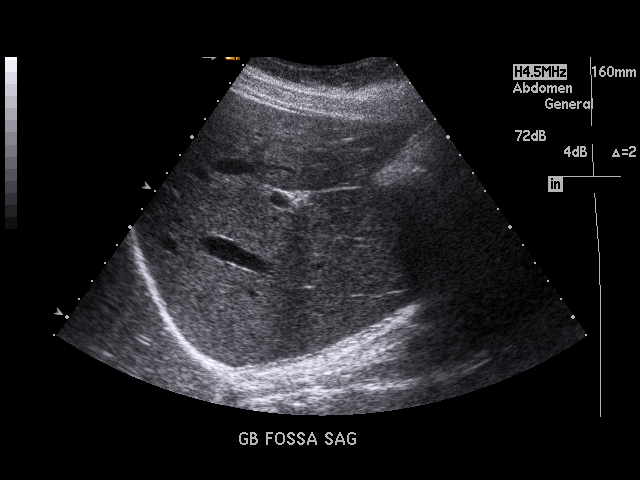
[im 40/64]
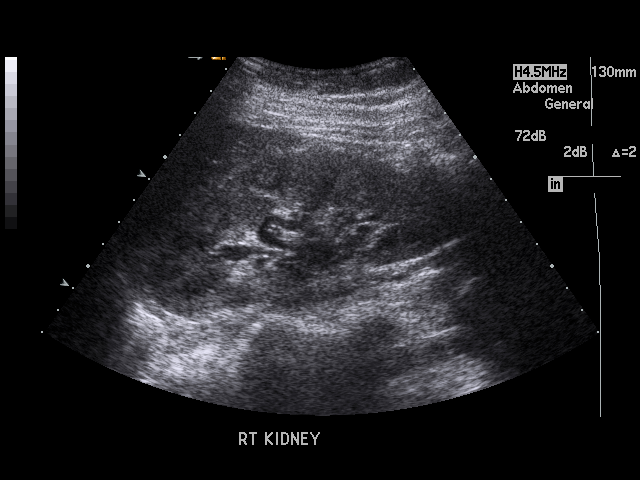
[im 43/64]
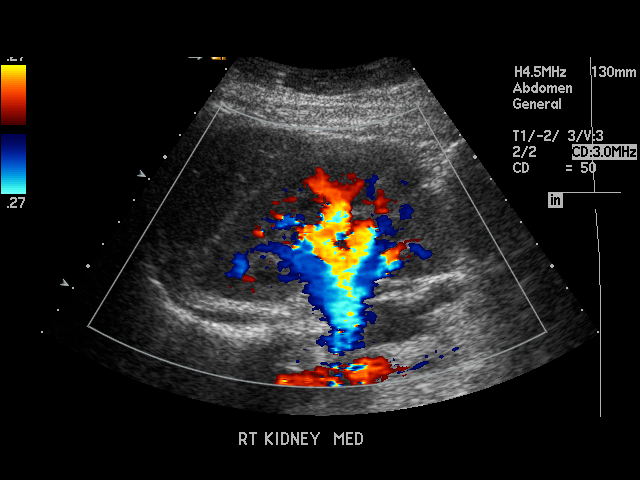
[im 48/64]
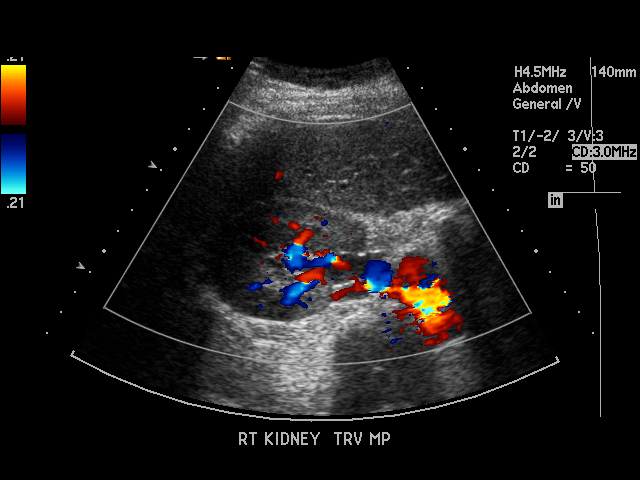
[im 50/64]
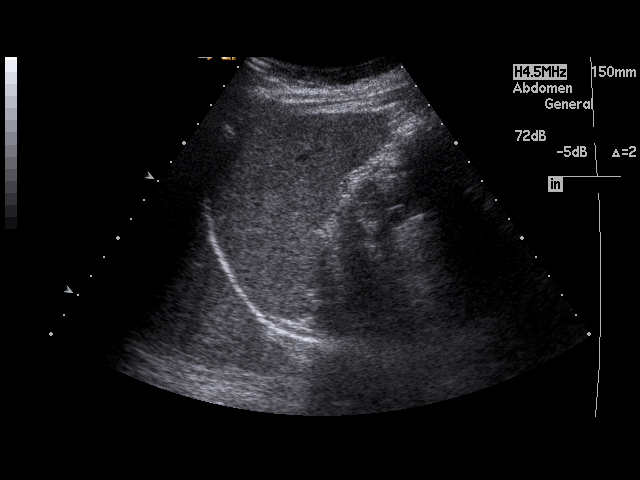
[im 56/64]
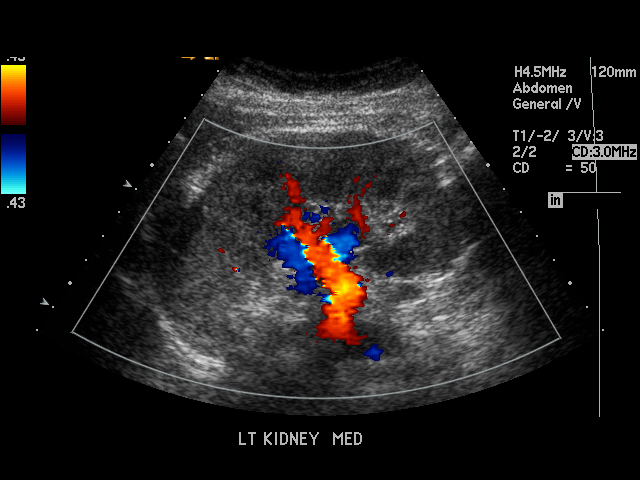
[im 58/64]
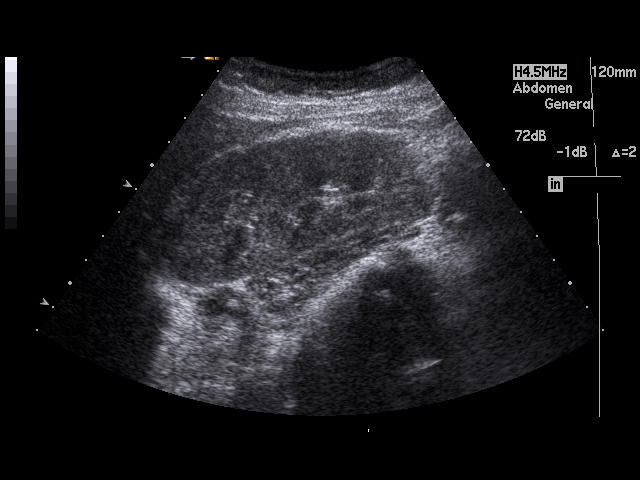
[im 64/64]
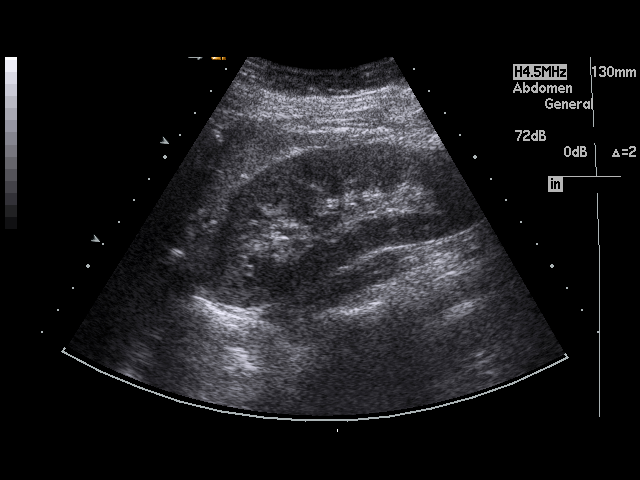

[17 of 25 positions shown; findings below may reference images not displayed]

FINDINGS: Visualized portions of the liver demonstrate normal echogenicity and normal
contours. The liver is without evidence of a focal hepatic lesion.

There is evidence of prior cholecystectomy. There is no intra- or
extrahepatic biliary ductal dilatation. The common duct measures 5.9 mm in
maximal diameter.

The visualized portion of the pancreas is normal in echogenicity. The spleen
is unremarkable. Bilateral kidneys are normal in echogenicity and size. The
right kidney measures 12.6 x 5.7 x 5.1 cm. The left kidney measures 12.4 x
4.6 x 5.2 cm. There are no renal calculi or hydronephrosis. The abdominal
aorta and IVC are unremarkable.
IMPRESSION: Prior cholecystectomy. Otherwise unremarkable abdominal ultrasound.

## 2013-06-12 IMAGING — US ABDOMEN ULTRASOUND LIMITED
1 series · 5 of 5 positions shown · non-contrast
Comparison: none

REASON FOR EXAM: Appendicitis
COMMENTS:

PROCEDURE:     US  - US ABDOMEN LIMITED SURVEY  - July 23, 2011 [DATE]
RESULT:     Comparison: 07/22/2011
TECHNIQUE: Multiple grayscale and color Doppler images obtained of the right
lower quadrant.

[Series 1: abdomen ultrasound limited · 5 of 5 slices shown]
[im 1/5]
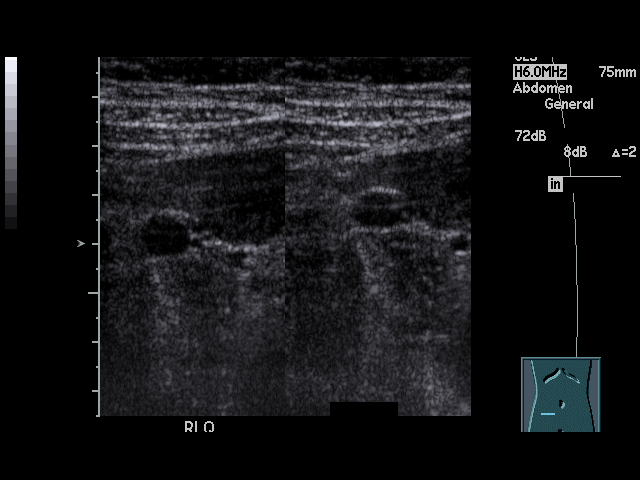
[im 2/5]
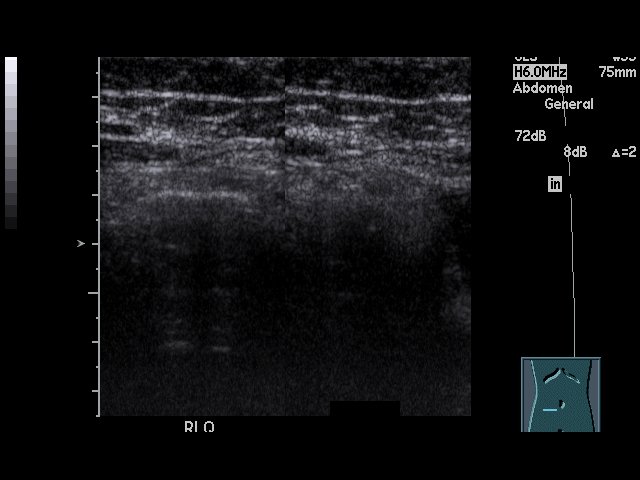
[im 3/5]
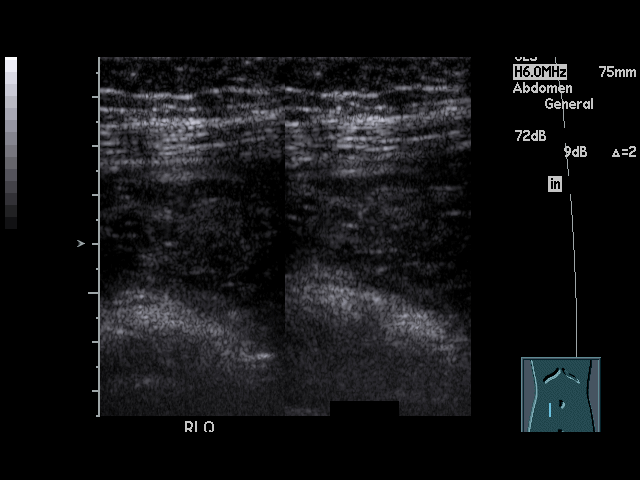
[im 4/5]
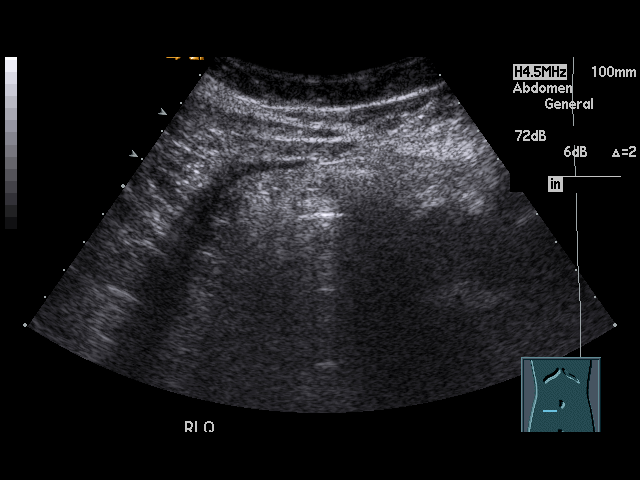
[im 5/5]
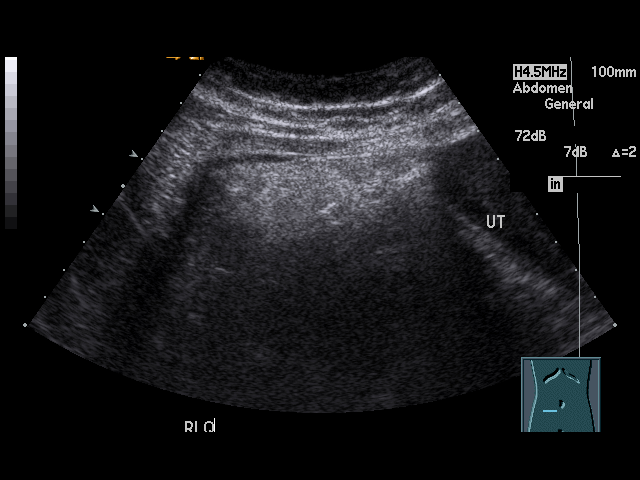

[5 of 5 positions shown; findings below may reference images not displayed]

FINDINGS: No normal or abnormal appendix identified. No free fluid seen.
IMPRESSION: The appendix was not identified. Please note, this does not exclude
appendicitis.

## 2014-07-06 ENCOUNTER — Emergency Department: Payer: Self-pay | Admitting: Emergency Medicine

## 2014-09-19 ENCOUNTER — Encounter (HOSPITAL_COMMUNITY): Payer: Self-pay | Admitting: *Deleted

## 2015-01-02 DIAGNOSIS — A6000 Herpesviral infection of urogenital system, unspecified: Secondary | ICD-10-CM

## 2015-01-02 HISTORY — DX: Herpesviral infection of urogenital system, unspecified: A60.00

## 2015-03-05 ENCOUNTER — Emergency Department
Admit: 2015-03-05 | Disposition: A | Discharge status: Home / Self Care | Payer: Self-pay | Admitting: Emergency Medicine

## 2015-03-12 NOTE — Op Note (Signed)
PATIENT NAME:  [name], Denise KINDIG MR#:  Bryan DATE OF BIRTH:  October 09, 1986  DATE OF PROCEDURE:  01/22/2012  PREOPERATIVE DIAGNOSIS: Multiparity, desires permanent sterilization.   POSTOPERATIVE DIAGNOSIS: Multiparity, desires permanent sterilization.  PROCEDURE: Postpartum tubal ligation, [place] method.   SURGEON: Will Bonnet, MD   ANESTHESIA: General.   COMPLICATIONS: None.   ESTIMATED BLOOD LOSS: Minimal.   IV FLUIDS: 700 mL of Crystalloid.   SPECIMENS: Portion of right and left tube to pathology for permanent.   INDICATIONS: The patient is a 29 year old gravida 4, para 3-0-1-3 who is status post vaginal delivery. She desires permanent sterilization. The risks and benefits of the procedure were discussed with the patient including risk of failure of 3 to 5 in every 1000 with an increased risk of ectopic gestation if pregnancy occurs. She had previously signed a Medicaid consent in November and even though she is a young age strongly desires to be permanently sterilized.   FINDINGS: Normal uterus, tubes, and ovaries.   PROCEDURE: The patient was taken to the operating room where her general anesthesia was found to be adequate. A small transverse infraumbilical skin incision was then made with a scalpel. The incision was carried down through the underlying fascia until the peritoneum was identified and entered. The peritoneum was noted to be free of any adhesions and the incision was then extended with the Metzenbaum scissors.   The patient's right fallopian tube was then identified, brought to the incision and grasped with a Babcock clamp. The tube was then followed out to the fimbria. Babcock clamp was then used to grasp the tube approximately 4 cm from the cornual region. A 3 cm segment of tube was then ligated with a free-tie of plain gut and excised. Good hemostasis was noted and the tube was returned to the abdomen. The left fallopian tube was then ligated and a 3 cm  segment excised in a similar fashion. Excellent hemostasis was noted and the tube returned to the abdomen.   The peritoneum and fascia were then closed in a single layer using #2 0 Vicryl. The skin was closed in a subcuticular fashion using 3-0 Vicryl.   The patient tolerated the procedure well. Sponge, lap, and needle counts were correct x2. The patient was taken to the recovery room in stable condition.   ____________________________ Will Bonnet, MD sdj:drc D: 01/22/2012 22:37:28 ET T: 01/23/2012 10:06:02 ET JOB#: [id number]  cc: Will Bonnet, MD, <Dictator> Will Bonnet MD ELECTRONICALLY SIGNED 02/09/2012 23:06
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
## 2015-03-28 NOTE — H&P (Signed)
L&D Evaluation:  History Expanded:   HPI 29 yo [id number] at [place], whjo is sent from office for contractions. she is haviong rare contractions ont he monitor and none are palpated, the patient cervix was 3 on last check. she has received steroids and she has been ruled out for PProm but was told by womens that" she looked like she ruptured but thy sent her home.e    Gravida 4    Term 2    PreTerm 0    Abortion 1    Living 2    Blood Type B negative    Group B Strep Results (Result >5wks must be treated as unknown) unknown/result > 5 weeks ago  GBS bacteruria    Maternal HIV Negative    Maternal Syphilis Ab Nonreactive    Maternal Varicella Immune    Rubella Results immune    Maternal T-Dap Unknown    [facility] 28-Jan-2010    Presents with contractions    Patient's Medical History No Chronic Illness    Patient's Surgical History none    Medications Pre Natal Vitamins    Allergies NKDA    Social History none    Family History Non-Contributory    Current Prenatal Course Notable For preterm contraction   ROS:   ROS All systems were reviewed.  HEENT, CNS, GI, GU, Respiratory, CV, Renal and Musculoskeletal systems were found to be normal.   Exam:   Vital Signs stable    Urine Protein not completed    General no apparent distress    Mental Status clear    Chest clear    Heart normal sinus rhythm    Abdomen gravid, non-tender    Fetal Position vertex    Back no CVAT    Pelvic no external lesions, cervix almpost 2    Mebranes Intact    FHT normal rate with no decels    Ucx regular   Impression:   Impression prfeterm ctx   Plan:   Plan monitor contractions and for cervical change    Comments will do [place] for afi   Electronic Signatures: Erik Obey (MD)  (Signed 08-Jan-13 17:42)  Authored: L&D Evaluation   Last Updated: 08-Jan-13 17:42 by Erik Obey (MD)

## 2015-03-28 NOTE — H&P (Signed)
L&D Evaluation:  History:   HPI 29 yo [id number] at [place], who presents to L&D for evaluation of contractions.  She has been having off an on ctx's for about a month now, and was evaluated for possible PPROM on December 17 and transfered to [facility] but sent home after ensuring that no PPROM.  Cervix was 2 cm at that time and has been documented to be anywhere between 1-3 cm since that time.  She was started on Procardia last week to take until 36 weeks but pt states "its not working" and was having having ctx's every 2-3 mins earlier today.  Upon arrival to L&D, RN noted bright red bleeding on underpad that pt claims she was unaware of.    Presents with contractions    Patient's Medical History No Chronic Illness    Patient's Surgical History none    Medications Pre Natal Vitamins    Allergies NKDA    Social History none    Family History Non-Contributory   ROS:   ROS All systems were reviewed.  HEENT, CNS, GI, GU, Respiratory, CV, Renal and Musculoskeletal systems were found to be normal.   Exam:   Vital Signs stable    Urine Protein not completed    General no apparent distress    Mental Status clear    Chest clear    Heart normal sinus rhythm    Abdomen gravid, non-tender    Fetal Position vertex    Back no CVAT    Pelvic no external lesions, 2-3/50/-2    Mebranes Intact    FHT normal rate with no decels    Ucx absent, no ctx's on monitor    Skin dry   Impression:   Impression 3rd trimester bleeding, cramping   Plan:   Plan monitor contractions and for cervical change    Comments Nitrazine and fern negative, SSE done and noted no bleeding.  Cervix was 2-3 cm, consulted Dr. Georgianne Fick and plan to observe overnight and watch for bleeding and/or ctx's. Will dc in AM if no problems overnight.   Electronic Signatures: Shann Medal (CNM)  (Signed 09-Jan-13 23:34)  Authored: L&D Evaluation   Last Updated: 09-Jan-13 23:34 by Shann Medal (CNM)

## 2015-03-28 NOTE — H&P (Signed)
L&D Evaluation:  History:   HPI 29 yo [id number] at [place], who presents to L&D after being seen in office.  She has been having ctx's off and on, and c/o mucousy discharge today and went to office to get evaluated and found to be fern +.  She has been having off an on ctx's since 27 weeks, and was evaluated for possible PPROM on December 17 and transfered to [facility] but sent home after ensuring that no PPROM.  Cervix was 2 cm at that time and has been documented to be anywhere between 1-3 cm since that time.  She has been on Procardia every 6 hours since 29 weeks.   Labs: B Negative (Rhogam rec'd at 28 weeks), RI, VI, GBS Bacteruria    Presents with contractions, leaking fluid    Patient's Medical History No Chronic Illness    Patient's Surgical History none    Medications Pre Natal Vitamins  Procardia 10 mg every 6 hours    Allergies NKDA    Social History none    Family History Non-Contributory   ROS:   ROS All systems were reviewed.  HEENT, CNS, GI, GU, Respiratory, CV, Renal and Musculoskeletal systems were found to be normal.   Exam:   Vital Signs stable    Urine Protein not completed    General no apparent distress    Mental Status clear    Chest clear    Heart normal sinus rhythm    Abdomen gravid, non-tender    Estimated Fetal Weight Average for gestational age    Back no CVAT    Pelvic no external lesions, 4-5 cm per TKB at office    Mebranes Ruptured, per TKB at office fern +    Description clear    FHT normal rate with no decels    Ucx absent, no ctx's on monitor, pt states she is feeling ctx's    Skin dry   Impression:   Impression PPROM, reactive NST, 34 weeks   Plan:   Plan EFM/NST, monitor contractions and for cervical change, antibiotics for GBBS prophylaxis   Electronic Signatures: Shann Medal (CNM)  (Signed 28-Jan-13 13:50)  Authored: L&D Evaluation   Last Updated: 28-Jan-13 13:50 by Shann Medal (CNM)

## 2015-03-31 ENCOUNTER — Emergency Department: Payer: Medicaid Other

## 2015-03-31 ENCOUNTER — Encounter: Payer: Self-pay | Admitting: Emergency Medicine

## 2015-03-31 DIAGNOSIS — M25462 Effusion, left knee: Secondary | ICD-10-CM | POA: Diagnosis not present

## 2015-03-31 DIAGNOSIS — M25562 Pain in left knee: Secondary | ICD-10-CM | POA: Diagnosis present

## 2015-03-31 NOTE — ED Notes (Signed)
Pt states left knee pain "all over" pt states "it just gives out on me and i can't feel my toes." 2+ pedal pulse noted. No swelling noted to knee. No redness noted to knee. Pt states "i can feel you touch my toes, but barely." pt able to move all toes wtihout difficulty. Pt complains of pain to knee when she moves it.

## 2015-04-01 ENCOUNTER — Emergency Department
Admission: EM | Admit: 2015-04-01 | Discharge: 2015-04-01 | Disposition: A | Discharge status: Home / Self Care | Payer: Medicaid Other | Attending: Emergency Medicine | Admitting: Emergency Medicine

## 2015-04-01 ENCOUNTER — Emergency Department: Payer: Medicaid Other

## 2015-04-01 DIAGNOSIS — M25562 Pain in left knee: Secondary | ICD-10-CM

## 2015-04-01 DIAGNOSIS — M25462 Effusion, left knee: Secondary | ICD-10-CM

## 2015-04-01 MED ORDER — KETOROLAC TROMETHAMINE 60 MG/2ML IM SOLN
60.0000 mg | Freq: Once | INTRAMUSCULAR | Status: AC
  Administered 2015-04-01: 60 mg via INTRAMUSCULAR

## 2015-04-01 MED ORDER — ETODOLAC 200 MG PO CAPS: 200.0000 mg | ORAL_CAPSULE | Freq: Two times a day (BID) | ORAL | Status: DC

## 2015-04-01 MED ORDER — OXYCODONE-ACETAMINOPHEN 5-325 MG PO TABS: 1.0000 | ORAL_TABLET | Freq: Once | ORAL | Status: DC

## 2015-04-01 MED ORDER — KETOROLAC TROMETHAMINE 60 MG/2ML IM SOLN
INTRAMUSCULAR | Status: AC
  Filled 2015-04-01: qty 2

## 2015-04-01 NOTE — Discharge Instructions (Signed)
Knee Effusion The medical term for having fluid in your knee is effusion. This is often due to an internal derangement of the knee. This means something is wrong inside the knee. Some of the causes of fluid in the knee may be torn cartilage, a torn ligament, or bleeding into the joint from an injury. Your knee is likely more difficult to bend and move. This is often because there is increased pain and pressure in the joint. The time it takes for recovery from a knee effusion depends on different factors, including:   Type of injury.  Your age.  Physical and medical conditions.  Rehabilitation Strategies. How long you will be away from your normal activities will depend on what kind of knee problem you have and how much damage is present. Your knee has two types of cartilage. Articular cartilage covers the bone ends and lets your knee bend and move smoothly. Two menisci, thick pads of cartilage that form a rim inside the joint, help absorb shock and stabilize your knee. Ligaments bind the bones together and support your knee joint. Muscles move the joint, help support your knee, and take stress off the joint itself. CAUSES  Often an effusion in the knee is caused by an injury to one of the menisci. This is often a tear in the cartilage. Recovery after a meniscus injury depends on how much meniscus is damaged and whether you have damaged other knee tissue. Small tears may heal on their own with conservative treatment. Conservative means rest, limited weight bearing activity and muscle strengthening exercises. Your recovery may take up to 6 weeks.  TREATMENT  Larger tears may require surgery. Meniscus injuries may be treated during arthroscopy. Arthroscopy is a procedure in which your surgeon uses a small telescope like instrument to look in your knee. Your caregiver can make a more accurate diagnosis (learning what is wrong) by performing an arthroscopic procedure. If your injury is on the inner margin  of the meniscus, your surgeon may trim the meniscus back to a smooth rim. In other cases your surgeon will try to repair a damaged meniscus with stitches (sutures). This may make rehabilitation take longer, but may provide better long term result by helping your knee keep its shock absorption capabilities. Ligaments which are completely torn usually require surgery for repair. HOME CARE INSTRUCTIONS  Use crutches as instructed.  If a brace is applied, use as directed.  Once you are home, an ice pack applied to your swollen knee may help with discomfort and help decrease swelling.  Keep your knee raised (elevated) when you are not up and around or on crutches.  Only take over-the-counter or prescription medicines for pain, discomfort, or fever as directed by your caregiver.  Your caregivers will help with instructions for rehabilitation of your knee. This often includes strengthening exercises.  You may resume a normal diet and activities as directed. SEEK MEDICAL CARE IF:   There is increased swelling in your knee.  You notice redness, swelling, or increasing pain in your knee.  An unexplained oral temperature above 102 F (38.9 C) develops. SEEK IMMEDIATE MEDICAL CARE IF:   You develop a rash.  You have difficulty breathing.  You have any allergic reactions from medications you may have been given.  There is severe pain with any motion of the knee. MAKE SURE YOU:   Understand these instructions.  Will watch your condition.  Will get help right away if you are not doing well or get worse.  Document Released: 01/25/2004 Document Revised: 01/27/2012 Document Reviewed: 03/30/2008 [facility] Patient Information 2015 [place], [place]. This information is not intended to replace advice given to you by your health care provider. Make sure you discuss any questions you have with your health care provider.  Knee Pain The knee is the complex joint between your thigh and your lower leg.  It is made up of bones, tendons, ligaments, and cartilage. The bones that make up the knee are:  The femur in the thigh.  The tibia and fibula in the lower leg.  The patella or kneecap riding in the groove on the lower femur. CAUSES  Knee pain is a common complaint with many causes. A few of these causes are:  Injury, such as:  A ruptured ligament or tendon injury.  Torn cartilage.  Medical conditions, such as:  Gout  Arthritis  Infections  Overuse, over training, or overdoing a physical activity. Knee pain can be minor or severe. Knee pain can accompany debilitating injury. Minor knee problems often respond well to self-care measures or get well on their own. More serious injuries may need medical intervention or even surgery. SYMPTOMS The knee is complex. Symptoms of knee problems can vary widely. Some of the problems are:  Pain with movement and weight bearing.  Swelling and tenderness.  Buckling of the knee.  Inability to straighten or extend your knee.  Your knee locks and you cannot straighten it.  Warmth and redness with pain and fever.  Deformity or dislocation of the kneecap. DIAGNOSIS  Determining what is wrong may be very straight forward such as when there is an injury. It can also be challenging because of the complexity of the knee. Tests to make a diagnosis may include:  Your caregiver taking a history and doing a physical exam.  Routine X-rays can be used to rule out other problems. X-rays will not reveal a cartilage tear. Some injuries of the knee can be diagnosed by:  Arthroscopy a surgical technique by which a small video camera is inserted through tiny incisions on the sides of the knee. This procedure is used to examine and repair internal knee joint problems. Tiny instruments can be used during arthroscopy to repair the torn knee cartilage (meniscus).  Arthrography is a radiology technique. A contrast liquid is directly injected into the knee  joint. Internal structures of the knee joint then become visible on X-ray film.  An MRI scan is a non X-ray radiology procedure in which magnetic fields and a computer produce two- or three-dimensional images of the inside of the knee. Cartilage tears are often visible using an MRI scanner. MRI scans have largely replaced arthrography in diagnosing cartilage tears of the knee.  Blood work.  Examination of the fluid that helps to lubricate the knee joint (synovial fluid). This is done by taking a sample out using a needle and a syringe. TREATMENT The treatment of knee problems depends on the cause. Some of these treatments are:  Depending on the injury, proper casting, splinting, surgery, or physical therapy care will be needed.  Give yourself adequate recovery time. Do not overuse your joints. If you begin to get sore during workout routines, back off. Slow down or do fewer repetitions.  For repetitive activities such as cycling or running, maintain your strength and nutrition.  Alternate muscle groups. For example, if you are a weight lifter, work the upper body on one day and the lower body the next.  Either tight or weak muscles do not  give the proper support for your knee. Tight or weak muscles do not absorb the stress placed on the knee joint. Keep the muscles surrounding the knee strong.  Take care of mechanical problems.  If you have flat feet, orthotics or special shoes may help. See your caregiver if you need help.  Arch supports, sometimes with wedges on the inner or outer aspect of the heel, can help. These can shift pressure away from the side of the knee most bothered by osteoarthritis.  A brace called an "unloader" brace also may be used to help ease the pressure on the most arthritic side of the knee.  If your caregiver has prescribed crutches, braces, wraps or ice, use as directed. The acronym for this is PRICE. This means protection, rest, ice, compression, and  elevation.  Nonsteroidal anti-inflammatory drugs (NSAIDs), can help relieve pain. But if taken immediately after an injury, they may actually increase swelling. Take NSAIDs with food in your stomach. Stop them if you develop stomach problems. Do not take these if you have a history of ulcers, stomach pain, or bleeding from the bowel. Do not take without your caregiver's approval if you have problems with fluid retention, heart failure, or kidney problems.  For ongoing knee problems, physical therapy may be helpful.  Glucosamine and chondroitin are over-the-counter dietary supplements. Both may help relieve the pain of osteoarthritis in the knee. These medicines are different from the usual anti-inflammatory drugs. Glucosamine may decrease the rate of cartilage destruction.  Injections of a corticosteroid drug into your knee joint may help reduce the symptoms of an arthritis flare-up. They may provide pain relief that lasts a few months. You may have to wait a few months between injections. The injections do have a small increased risk of infection, water retention, and elevated blood sugar levels.  Hyaluronic acid injected into damaged joints may ease pain and provide lubrication. These injections may work by reducing inflammation. A series of shots may give relief for as long as 6 months.  Topical painkillers. Applying certain ointments to your skin may help relieve the pain and stiffness of osteoarthritis. Ask your pharmacist for suggestions. Many over the-counter products are approved for temporary relief of arthritis pain.  In some countries, doctors often prescribe topical NSAIDs for relief of chronic conditions such as arthritis and tendinitis. A review of treatment with NSAID creams found that they worked as well as oral medications but without the serious side effects. PREVENTION  Maintain a healthy weight. Extra pounds put more strain on your joints.  Get strong, stay limber. Weak muscles  are a common cause of knee injuries. Stretching is important. Include flexibility exercises in your workouts.  Be smart about exercise. If you have osteoarthritis, chronic knee pain or recurring injuries, you may need to change the way you exercise. This does not mean you have to stop being active. If your knees ache after jogging or playing basketball, consider switching to swimming, water aerobics, or other low-impact activities, at least for a few days a week. Sometimes limiting high-impact activities will provide relief.  Make sure your shoes fit well. Choose footwear that is right for your sport.  Protect your knees. Use the proper gear for knee-sensitive activities. Use kneepads when playing volleyball or laying carpet. Buckle your seat belt every time you drive. Most shattered kneecaps occur in car accidents.  Rest when you are tired. SEEK MEDICAL CARE IF:  You have knee pain that is continual and does not seem to be  getting better.  SEEK IMMEDIATE MEDICAL CARE IF:  Your knee joint feels hot to the touch and you have a high fever. MAKE SURE YOU:   Understand these instructions.  Will watch your condition.  Will get help right away if you are not doing well or get worse. Document Released: 09/01/2007 Document Revised: 01/27/2012 Document Reviewed: 09/01/2007 [facility] Patient Information 2015 [place], [place]. This information is not intended to replace advice given to you by your health care provider. Make sure you discuss any questions you have with your health care provider.

## 2015-04-01 NOTE — ED Notes (Signed)
Pt reports left knee pain x couple days.  Pt denies any injury.  Pt reports pain with movement and throbbing sensation.  Pt reports numbness/tingling in left lower leg.  Pt NAD at this time.

## 2015-04-01 NOTE — ED Provider Notes (Signed)
[facility] Emergency Department Provider Note  ____________________________________________  Time seen: Approximately 725 AM  I have reviewed the triage vital signs and the nursing notes.   HISTORY  Chief Complaint Knee Pain    HPI Denise Bryan is a 29 y.o. female who comes in with left knee pain. The patient reports that the pain started a couple of days ago. She reports that her knee feels like is throbbing but today it feels worse. The patient reports that she has been taking Tylenol and ibuprofen for the pain. 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 She reports that the swelling does come and go but again it is worse tonight. The patient reports that nothing makes it worse or better. She currently is a 10 out of 10 in intensity. The patient denies previous injury previous pain in this knee or any history of arthritis. The patient also denies redness. She reports that she has had some swelling occasionally in her left leg as well which is concerning. The patient came in for follow-up and evaluation of her knee problems.   Past Medical History  Diagnosis Date  . No pertinent past medical history     There are no active problems to display for this patient.   Past Surgical History  Procedure Laterality Date  . Gallstones  sept 18, 2007  . Dilation and curettage of uterus  2010    Current Outpatient Rx  Name  Route  Sig  Dispense  Refill  . acetaminophen (TYLENOL) 500 MG tablet   Oral   Take 1,000 mg by mouth every 6 (six) hours as needed. pain          . prenatal vitamin w/FE, FA (PRENATAL 1 + 1) 27-1 MG TABS   Oral   Take 1 tablet by mouth daily.             Allergies Fish-derived products; Iodine; and Tramadol  No family history on file.  Social History History  Substance Use Topics  . Smoking status: Never Smoker   .  Smokeless tobacco: Not on file  . Alcohol Use: No    Review of Systems Constitutional: No fever/chills Eyes: No visual changes. ENT: No sore throat. Cardiovascular: Denies chest pain. Respiratory: Denies shortness of breath. Gastrointestinal: No abdominal pain.  No nausea, no vomiting.  No diarrhea.  No constipation. Genitourinary: Negative for dysuria. Musculoskeletal: Left knee pain and swelling. Negative for back pain. Skin: Negative for rash. Neurological: Negative for headaches, focal weakness or numbness. Hematological/Lymphatic:Left leg swelling occasionally  10-point ROS otherwise negative.  ____________________________________________   PHYSICAL EXAM:  VITAL SIGNS: ED Triage Vitals  Enc Vitals Group     BP 03/31/15 2245 127/84 mmHg     Pulse Rate 03/31/15 2245 84     Resp 03/31/15 2245 16     Temp 03/31/15 2245 98.3 F (36.8 C)     Temp Source 03/31/15 2245 Oral     SpO2 03/31/15 2245 100 %     Weight 03/31/15 2245 150 lb (68.04 kg)     Height 03/31/15 2245 5\' 3"  (1.6 m)     Head Cir --      Peak Flow --      Pain Score 03/31/15 2247 10     Pain Loc --      Pain Edu? --      Excl.  in [place]? --     Constitutional: Alert and oriented. Well appearing and in moderate distress. Eyes: Conjunctivae are normal. PERRL. EOMI. Head: Atraumatic. Nose: No congestion/rhinnorhea. Mouth/Throat: Mucous membranes are moist.  Oropharynx non-erythematous. Cardiovascular: Normal rate, regular rhythm. Grossly normal heart sounds.  Good peripheral circulation. Respiratory: Normal respiratory effort.  No retractions. Lungs CTAB. Gastrointestinal: Soft and nontender. No distention. No abdominal bruits. No CVA tenderness. Genitourinary: Deferred Musculoskeletal: Left knee tenderness to palpation along the joint lines. The patient does have mild swelling and effusion to her left knee. The patient does have range of motion with some mild difficulty due to pain. Neurologic:  Normal  speech and language. No gross focal neurologic deficits are appreciated. Speech is normal.  Skin:  Skin is warm, dry and intact. No rash noted. Psychiatric: Mood and affect are normal. Speech and behavior are normal.  ____________________________________________   LABS (all labs ordered are listed, but only abnormal results are displayed)  Labs Reviewed - No data to display ____________________________________________  EKG  None ____________________________________________  RADIOLOGY  Left lower extremity venous Doppler: No evidence of deep venous thrombosis X-ray of left knee: No fracture dislocation or effusion. ____________________________________________   PROCEDURES  Procedure(s) performed: None  Critical Care performed: No  ____________________________________________   INITIAL IMPRESSION / ASSESSMENT AND PLAN / ED COURSE  Pertinent labs & imaging results that were available during my care of the patient were reviewed by me and considered in my medical decision making (see chart for details).  The patient is a 29 year old female who comes in with left knee pain and swelling that has been coming and going for the last few days. The x-ray and the ultrasound are unremarkable. I will discharge the patient to have her follow up with orthopedic surgery for further evaluation of her knee swelling and pain. The patient receive a dose of Toradol IM 60 mg and will be discharged home with a total lack for further treatment of her pain. ____________________________________________   FINAL CLINICAL IMPRESSION(S) / ED DIAGNOSES  Final diagnoses:  Left knee pain  Left knee effusion       Loney Hering, MD 04/01/15 [phone]

## 2016-06-29 ENCOUNTER — Emergency Department
Admission: EM | Admit: 2016-06-29 | Discharge: 2016-06-29 | Disposition: A | Discharge status: Home / Self Care | Payer: Medicaid Other | Attending: Emergency Medicine | Admitting: Emergency Medicine

## 2016-06-29 ENCOUNTER — Encounter: Payer: Self-pay | Admitting: Emergency Medicine

## 2016-06-29 DIAGNOSIS — G43009 Migraine without aura, not intractable, without status migrainosus: Secondary | ICD-10-CM

## 2016-06-29 DIAGNOSIS — G43909 Migraine, unspecified, not intractable, without status migrainosus: Secondary | ICD-10-CM | POA: Insufficient documentation

## 2016-06-29 DIAGNOSIS — R51 Headache: Secondary | ICD-10-CM | POA: Diagnosis present

## 2016-06-29 MED ORDER — METOCLOPRAMIDE HCL 5 MG/ML IJ SOLN
10.0000 mg | Freq: Once | INTRAMUSCULAR | Status: AC
  Administered 2016-06-29: 10 mg via INTRAVENOUS
  Filled 2016-06-29: qty 2

## 2016-06-29 MED ORDER — BACLOFEN 10 MG PO TABS: 10.0000 mg | ORAL_TABLET | Freq: Three times a day (TID) | ORAL | 0 refills | Status: DC

## 2016-06-29 MED ORDER — KETOROLAC TROMETHAMINE 30 MG/ML IJ SOLN
30.0000 mg | Freq: Once | INTRAMUSCULAR | Status: AC
  Administered 2016-06-29: 30 mg via INTRAVENOUS
  Filled 2016-06-29: qty 1

## 2016-06-29 MED ORDER — ONDANSETRON 4 MG PO TBDP
4.0000 mg | ORAL_TABLET | Freq: Once | ORAL | Status: AC
  Administered 2016-06-29: 4 mg via ORAL
  Filled 2016-06-29: qty 1

## 2016-06-29 MED ORDER — BUTALBITAL-APAP-CAFFEINE 50-325-40 MG PO TABS
1.0000 | ORAL_TABLET | Freq: Four times a day (QID) | ORAL | 0 refills | Status: DC | PRN
Start: 2016-06-29 — End: 2017-08-13

## 2016-06-29 NOTE — ED Triage Notes (Signed)
Pt reports history of migraines. C/o Migraines for past 3-4 days with no relief with Tylenol or Ibuprofen.

## 2016-06-29 NOTE — ED Provider Notes (Signed)
[facility] Emergency Department Provider Note  ____________________________________________  Time seen: Approximately 2:39 PM  I have reviewed the triage vital signs and the nursing notes.   HISTORY  Chief Complaint No chief complaint on file.    HPI Denise Bryan is a 30 y.o. female presents with a 3 to four-day history of migraines. Patient reports past medical history is significant for migraines and this feels typical of her migraines. Started off with Tylenol or ibuprofen and describes pain as 10 over 10 but not the worst pain ever.   Past Medical History:  Diagnosis Date  . No pertinent past medical history     There are no active problems to display for this patient.   Past Surgical History:  Procedure Laterality Date  . DILATION AND CURETTAGE OF UTERUS  2010  . gallstones  sept 18, 2007    Prior to Admission medications   Medication Sig Start Date End Date Taking? Authorizing Provider  baclofen (LIORESAL) 10 MG tablet Take 1 tablet (10 mg total) by mouth 3 (three) times daily. 06/29/16   Pierce Crane Beers, PA-C  butalbital-acetaminophen-caffeine (FIORICET) [phone] MG tablet Take 1-2 tablets by mouth every 6 (six) hours as needed for headache. 06/29/16   Arlyss Repress, PA-C  etodolac (LODINE) 200 MG capsule Take 1 capsule (200 mg total) by mouth 2 (two) times daily. 04/01/15   Loney Hering, MD  prenatal vitamin w/FE, FA (PRENATAL 1 + 1) 27-1 MG TABS Take 1 tablet by mouth daily.      Historical Provider, MD    Allergies Fish-derived products; Iodine; and Tramadol  No family history on file.  Social History Social History  Substance Use Topics  . Smoking status: Never Smoker  . Smokeless tobacco: Not on file  . Alcohol use No    Review of Systems Constitutional: No fever/chills Eyes: No visual changes.Positive photophobia ENT: No sore throat. Cardiovascular: Denies chest pain. Respiratory: Denies shortness of  breath. Genitourinary: Negative for dysuria. Musculoskeletal: Negative for back pain. Skin: Negative for rash. Neurological: Positive for headaches, negative for focal weakness or numbness.  10-point ROS otherwise negative.  ____________________________________________   PHYSICAL EXAM:  VITAL SIGNS: ED Triage Vitals  Enc Vitals Group     BP 06/29/16 1422 113/71     Pulse Rate 06/29/16 1422 82     Resp 06/29/16 1422 18     Temp 06/29/16 1422 98.4 F (36.9 C)     Temp Source 06/29/16 1422 Oral     SpO2 06/29/16 1422 97 %     Weight 06/29/16 1421 146 lb (66.2 kg)     Height 06/29/16 1421 5\' 4"  (1.626 m)     Head Circumference --      Peak Flow --      Pain Score 06/29/16 1424 10     Pain Loc --      Pain Edu? --      Excl. in [place]? --     Constitutional: Alert and oriented. Well appearing and in no acute distress. Eyes: Conjunctivae are normal. PERRL. EOMI. Head: Atraumatic. Nose: No congestion/rhinnorhea. Mouth/Throat: Mucous membranes are moist.  Oropharynx non-erythematous. Neck: No stridor.  Supple, full range of motion Cardiovascular: Normal rate, regular rhythm. Grossly normal heart sounds.  Good peripheral circulation. Respiratory: Normal respiratory effort.  No retractions. Lungs CTAB. Neurologic:  Normal speech and language. No gross focal neurologic deficits are appreciated. No gait instability. Skin:  Skin is warm, dry and intact. No rash noted.  Psychiatric: Mood and affect are normal. Speech and behavior are normal.  ____________________________________________   LABS (all labs ordered are listed, but only abnormal results are displayed)  Labs Reviewed - No data to display ____________________________________________  EKG   ____________________________________________  RADIOLOGY   ____________________________________________   PROCEDURES  Procedure(s) performed: None  Critical Care performed:  No  ____________________________________________   INITIAL IMPRESSION / ASSESSMENT AND PLAN / ED COURSE  Pertinent labs & imaging results that were available during my care of the patient were reviewed by me and considered in my medical decision making (see chart for details). Review of the [place] CSRS was performed in accordance of the [place] prior to dispensing any controlled drugs.  Acute migraine headache resolved. Patient discharged home with prescription for Fioricet and baclofen. She is follow-up with her PCP for possible neurology referral. She voices no other emergency medical complaints at this time.  Clinical Course   IV Toradol, Reglan and by mouth Zofran given while in the ED. ____________________________________________   FINAL CLINICAL IMPRESSION(S) / ED DIAGNOSES  Final diagnoses:  Nonintractable migraine, unspecified migraine type     This chart was dictated using voice recognition software/Dragon. Despite best efforts to proofread, errors can occur which can change the meaning. Any change was purely unintentional.    Arlyss Repress, PA-C 06/29/16 1559    Carrie Mew, MD 06/30/16 1800

## 2016-06-29 NOTE — ED Notes (Signed)
Pt verbalized understanding of discharge instructions. NAD at this time. 

## 2016-09-18 DIAGNOSIS — Z1379 Encounter for other screening for genetic and chromosomal anomalies: Secondary | ICD-10-CM

## 2016-09-18 HISTORY — DX: Encounter for other screening for genetic and chromosomal anomalies: Z13.79

## 2016-11-05 ENCOUNTER — Emergency Department
Admission: EM | Admit: 2016-11-05 | Discharge: 2016-11-05 | Disposition: A | Discharge status: Home / Self Care | Payer: Medicaid Other | Attending: Emergency Medicine | Admitting: Emergency Medicine

## 2016-11-05 ENCOUNTER — Emergency Department: Payer: Medicaid Other

## 2016-11-05 DIAGNOSIS — R05 Cough: Secondary | ICD-10-CM | POA: Diagnosis present

## 2016-11-05 DIAGNOSIS — J069 Acute upper respiratory infection, unspecified: Secondary | ICD-10-CM | POA: Diagnosis not present

## 2016-11-05 DIAGNOSIS — Z79899 Other long term (current) drug therapy: Secondary | ICD-10-CM | POA: Insufficient documentation

## 2016-11-05 DIAGNOSIS — J111 Influenza due to unidentified influenza virus with other respiratory manifestations: Secondary | ICD-10-CM

## 2016-11-05 DIAGNOSIS — R69 Illness, unspecified: Secondary | ICD-10-CM

## 2016-11-05 DIAGNOSIS — B9789 Other viral agents as the cause of diseases classified elsewhere: Secondary | ICD-10-CM

## 2016-11-05 MED ORDER — IPRATROPIUM-ALBUTEROL 0.5-2.5 (3) MG/3ML IN SOLN
3.0000 mL | Freq: Once | RESPIRATORY_TRACT | Status: AC
  Administered 2016-11-05: 3 mL via RESPIRATORY_TRACT
  Filled 2016-11-05: qty 3

## 2016-11-05 MED ORDER — ACETAMINOPHEN-CODEINE #3 300-30 MG PO TABS: 1.0000 | ORAL_TABLET | Freq: Three times a day (TID) | ORAL | 0 refills | Status: DC | PRN

## 2016-11-05 MED ORDER — BENZONATATE 100 MG PO CAPS: 100.0000 mg | ORAL_CAPSULE | Freq: Three times a day (TID) | ORAL | 0 refills | Status: DC | PRN

## 2016-11-05 MED ORDER — AZITHROMYCIN 250 MG PO TABS: ORAL_TABLET | ORAL | 0 refills | Status: DC

## 2016-11-05 NOTE — ED Provider Notes (Signed)
[facility] Emergency Department Provider Note ____________________________________________  Time seen: 2036  I have reviewed the triage vital signs and the nursing notes.  HISTORY  Chief Complaint  Cough  HPI Denise Bryan is a 30 y.o. female presents to the ED for evaluation of persistent cough since last week. Patient describes onset of soreness in the ribs and irritation to throat. She is taking over-the-counter medications without significant benefit. She does admit to nausea without vomiting and fevers with a Tmax of 103.31F today. She has been taking Tylenol to reduce fevers and reports benefit.Patient reports she did not receive the flu vaccine for the season.   Past Medical History:  Diagnosis Date  . No pertinent past medical history     There are no active problems to display for this patient.  Past Surgical History:  Procedure Laterality Date  . DILATION AND CURETTAGE OF UTERUS  2010  . gallstones  sept 18, 2007    Prior to Admission medications   Medication Sig Start Date End Date Taking? Authorizing Provider  acetaminophen-codeine (TYLENOL #3) 300-30 MG tablet Take 1 tablet by mouth every 8 (eight) hours as needed for moderate pain. 11/05/16   Glennda Weatherholtz V Bacon Hovanes Hymas, PA-C  azithromycin (ZITHROMAX Z-PAK) 250 MG tablet Take 2 tablets (500 mg) on  Day 1,  followed by 1 tablet (250 mg) once daily on Days 2 through 5. 11/05/16   Nevena Rozenberg V Bacon Riko Lumsden, PA-C  baclofen (LIORESAL) 10 MG tablet Take 1 tablet (10 mg total) by mouth 3 (three) times daily. 06/29/16   Pierce Crane Beers, PA-C  benzonatate (TESSALON PERLES) 100 MG capsule Take 1 capsule (100 mg total) by mouth 3 (three) times daily as needed for cough (Take 1-2 per dose). 11/05/16   Mishika Flippen V Bacon Cailey Trigueros, PA-C  butalbital-acetaminophen-caffeine (FIORICET) [phone] MG tablet Take 1-2 tablets by mouth every 6 (six) hours as needed for headache. 06/29/16   Arlyss Repress, PA-C  etodolac  (LODINE) 200 MG capsule Take 1 capsule (200 mg total) by mouth 2 (two) times daily. 04/01/15   Loney Hering, MD  prenatal vitamin w/FE, FA (PRENATAL 1 + 1) 27-1 MG TABS Take 1 tablet by mouth daily.      Historical Provider, MD    Allergies Fish-derived products; Iodine; and Tramadol  No family history on file.  Social History Social History  Substance Use Topics  . Smoking status: Never Smoker  . Smokeless tobacco: Not on file  . Alcohol use No    Review of Systems  Constitution: Positive or fever. Eyes: Negative for visual changes. [id number] sore throat. Cardiovascular: Negative for chest pain. Respiratory: Negative for shortness of breath. reports cough as above.  Gastrointestinal: Negative for abdominal pain, vomiting and diarrhea. Genitourinary: Negative for dysuria. Musculoskeletal: Negative for back pain. chest wall pain as noted. Skin: Negative for rash. Neurological: Negative for headaches, focal weakness or numbness. ____________________________________________  PHYSICAL EXAM:  VITAL SIGNS: ED Triage Vitals  Enc Vitals Group     BP --      Pulse Rate 11/05/16 1956 98     Resp 11/05/16 1956 18     Temp 11/05/16 1956 98.5 F (36.9 C)     Temp Source 11/05/16 1956 Oral     SpO2 11/05/16 1956 98 %     Weight 11/05/16 1957 137 lb (62.1 kg)     Height 11/05/16 1957 5\' 4"  (1.626 m)     Head Circumference --      Peak  Flow --      Pain Score 11/05/16 1957 8     Pain Loc --      Pain Edu? --      Excl. in [place]? --    Constitutional: Alert and oriented. Well appearing and in no distress. Head: Normocephalic and atraumatic. Eyes: Conjunctivae are normal. PERRL. Normal extraocular movements Ears: Canals clear. TMs intact bilaterally. Nose: No congestion/rhinorrhea/epistaxis. Mouth/Throat: Mucous membranes are moist. Neck: Supple. No thyromegaly. Hematological/Lymphatic/Immunological: No cervical lymphadenopathy. Cardiovascular: Normal rate, regular  rhythm. Normal distal pulses. Respiratory: Normal respiratory effort. No wheezes/rales/rhonchi. Gastrointestinal: Soft and nontender. No distention. Musculoskeletal: Nontender with normal range of motion in all extremities.  Skin:  Skin is warm, dry and intact. No rash noted. ____________________________________________   RADIOLOGY CXR  IMPRESSION: No active cardiopulmonary disease. ____________________________________________  PROCEDURES  DuoNeb x 1 ____________________________________________  INITIAL IMPRESSION / ASSESSMENT AND PLAN / ED COURSE  Patient with symptoms consistent with a viral URI with cough. She actually may fact have an influenza considering her high fevers and cough symptoms. Patient at this time is discharge with supportive treatment including Tylenol with codeine, Tessalon Perles, and azithromycin. Antibiotic provided potential coverage for any continued to require pneumonia or post viral infection. She'll then creased fluid intake and continue monitoring treat fevers as appropriate. She should follow-up with Tril[facility] Drummer for ongoing symptom management.  Clinical Course    ____________________________________________  FINAL CLINICAL IMPRESSION(S) / ED DIAGNOSES  Final diagnoses:  Viral URI with cough  Influenza-like illness      Melvenia Needles, PA-C 11/06/16 2346    Delman Kitten, MD 11/16/16 1550

## 2016-11-05 NOTE — Discharge Instructions (Signed)
Your exam and chest x-ray were essentially normal. You likely have a viral upper respiratory infection, which may be influenza. You should continue to monitor and treat your fevers and symptoms and hydrate. Take the prescription meds as directed. Follow-up with [facility] for continued symptoms.

## 2016-11-05 NOTE — ED Notes (Signed)

## 2016-11-05 NOTE — ED Triage Notes (Signed)
Pt in with co forceful cough since last week, now has rib soreness and throat is irritated. Has taken otc meds without relief.

## 2017-06-10 ENCOUNTER — Ambulatory Visit: Payer: Self-pay | Admitting: Obstetrics and Gynecology

## 2017-06-19 ENCOUNTER — Ambulatory Visit: Payer: Self-pay | Admitting: Obstetrics and Gynecology

## 2017-06-23 ENCOUNTER — Encounter: Payer: Self-pay | Admitting: Obstetrics and Gynecology

## 2017-06-23 ENCOUNTER — Ambulatory Visit (INDEPENDENT_AMBULATORY_CARE_PROVIDER_SITE_OTHER): Payer: Medicaid Other | Admitting: Obstetrics and Gynecology

## 2017-06-23 VITALS — BP 114/76 | Ht 63.0 in | Wt 128.0 lb

## 2017-06-23 DIAGNOSIS — R102 Pelvic and perineal pain: Secondary | ICD-10-CM

## 2017-06-23 DIAGNOSIS — N921 Excessive and frequent menstruation with irregular cycle: Secondary | ICD-10-CM | POA: Diagnosis not present

## 2017-06-23 DIAGNOSIS — N941 Unspecified dyspareunia: Secondary | ICD-10-CM

## 2017-06-23 NOTE — Progress Notes (Signed)
Obstetrics & Gynecology Office Visit   Chief Complaint  Patient presents with  . Pelvic Pain    History of Present Illness: Pelvic Pain  The patient's primary symptoms include pelvic pain. This is a chronic problem. The current episode started more than 1 year ago. The problem occurs daily. The problem has been gradually worsening. The pain is moderate. The problem affects both sides. She is not pregnant. Associated symptoms include abdominal pain, back pain and painful intercourse. Pertinent negatives include no chills, constipation, diarrhea, dysuria, fever, flank pain, frequency, hematuria, nausea, urgency or vomiting. The symptoms are aggravated by intercourse. She has tried NSAIDs and oral narcotics for the symptoms. The treatment provided mild relief. She is sexually active. No, her partner does not have an STD. She uses tubal ligation for contraception. Her menstrual history has been irregular (sometimes coming twice per month lasting just a few days, not heavy or prolonged). Her past medical history is significant for endometriosis.   Past Medical History:  Diagnosis Date  . Endometriosis   . Family history of ovarian cancer   . Genetic testing of female 09/2016   TC=8.8% PER [facility]  . Spontaneous abortion   . Unspecified blood type, rh negative     Past Surgical History:  Procedure Laterality Date  . DIAGNOSTIC LAPAROSCOPY  04/26/2010   IUD EXPULSION/PELVIC PAIN  . DILATION AND CURETTAGE OF UTERUS  08/10/10; 08/06/07   HYDATIIDIFORM MOLE/POC, POC  . gallstones  sept 18, 2007  . HYSTEROSCOPY  04/26/2010   IUD EXPULSION /PELVIC PAIN  . TUBAL LIGATION  01/2012   SDJ    Gynecologic History: Patient's last menstrual period was 06/18/2017.  Obstetric History: [id number]  Family History  Problem Relation Age of Onset  . Diabetes Mother   . Hypertension Mother   . Cancer Mother 108       UTERINE  . Cancer Sister 20       CX  . Diabetes Maternal Uncle   . Diabetes Maternal  Grandmother   . Hypertension Maternal Grandmother   . Lung disease Maternal Grandfather   . Cancer Maternal Grandfather        LUNG  . Heart disease Paternal Grandmother   . Hypertension Paternal Grandmother     Social History   Social History  . Marital status: Single    Spouse name: N/A  . Number of children: 3  . Years of education: 12   Occupational History  . UNEMPLOYED    Social History Main Topics  . Smoking status: Never Smoker  . Smokeless tobacco: Never Used  . Alcohol use No  . Drug use: No  . Sexual activity: Not Currently    Birth control/ protection: Surgical   Other Topics Concern  . Not on file   Social History Narrative  . No narrative on file    Allergies  Allergen Reactions  . Fish-Derived Products Anaphylaxis  . Iodine   . Tramadol Hives      Medication Sig Start Date End Date Taking? Authorizing Provider  prenatal vitamin w/FE, FA (PRENATAL 1 + 1) 27-1 MG TABS Take 1 tablet by mouth daily.      [provider]    Review of Systems  Constitutional: Negative for chills, fever and weight loss.  HENT: Negative.   Eyes: Negative.   Respiratory: Negative.   Cardiovascular: Negative.   Gastrointestinal: Positive for abdominal pain. Negative for constipation, diarrhea, nausea and vomiting.  Genitourinary: Positive for pelvic pain. Negative for dysuria, flank pain,  frequency, hematuria and urgency.  Musculoskeletal: Positive for back pain.  Skin: Negative.   Neurological: Negative.   Endo/Heme/Allergies: Negative.   Psychiatric/Behavioral: Negative.      Physical Exam BP 114/76   Ht _0  (1.6 m)   Wt 128 lb (58.1 kg)   LMP 06/18/2017   BMI 22.67 kg/m  Patient's last menstrual period was 06/18/2017. Physical Exam  Constitutional: She is oriented to person, place, and time. She appears well-developed and well-nourished. No distress.  Genitourinary: Pelvic exam was performed with patient supine. There is no rash, tenderness,  lesion, injury or Bartholin's cyst on the right labia. There is no rash, tenderness, lesion, injury or Bartholin's cyst on the left labia. Vagina exhibits no lesion. There is tenderness (at introitus) in the vagina. No erythema or bleeding in the vagina. No signs of injury around the vagina. No vaginal discharge found.  Right adnexum displays tenderness (mild bilateral tenderness). Right adnexum does not display mass and does not display fullness.  Left adnexum displays tenderness. Left adnexum does not display mass and does not display fullness. Cervix does not exhibit motion tenderness, lesion, discharge, friability or polyp.   Uterus is tender (mild ttp), mobile and anteverted. Uterus is not enlarged or exhibiting a mass.  HENT:  Head: Normocephalic and atraumatic.  Eyes: EOM are normal. No scleral icterus.  Neck: Normal range of motion. Neck supple. No thyromegaly present.  Cardiovascular: Normal rate and regular rhythm.  Exam reveals no gallop and no friction rub.   No murmur heard. Pulmonary/Chest: Effort normal and breath sounds normal. No respiratory distress. She has no wheezes. She has no rales.  Abdominal: Soft. Bowel sounds are normal. She exhibits no distension and no mass. There is tenderness (tendern generally on lower abdomen). There is no rebound and no guarding.  Musculoskeletal: Normal range of motion. She exhibits no edema.  Lymphadenopathy:    She has no cervical adenopathy.  Neurological: She is alert and oriented to person, place, and time. No cranial nerve deficit.  Skin: Skin is warm and dry. No erythema.  Psychiatric: She has a normal mood and affect. Her behavior is normal. Judgment normal.   Female chaperone present for pelvic and breast  portions of the physical exam  Assessment: 31 y.o. [phone] female with chronic pelvic pain, dyspareunia, menorrhagia with irregular cycles  Plan: Problem List Items Addressed This Visit    Pelvic pain   Relevant Orders    IGP,CtNgTv,rfx Aptima HPV ASCU   US Transvaginal Non-OB   Dyspareunia, female   Relevant Orders   IGP,CtNgTv,rfx Aptima HPV ASCU   US Transvaginal Non-OB   Menorrhagia with irregular cycle - Primary   Relevant Orders   IGP,CtNgTv,rfx Aptima HPV ASCU   US Transvaginal Non-OB     Patient has a history of abdominal/pelvic pain and prior history of endometriosis.  Workups in the past have been negative.  Will check pap smear with gonorrhea, chlamydia, and trichomonas.  Not enough to diagnose with PID at this time.  Pelvic ultrasound to reassess pelvic structures.  Will consider directed treatment for endometriosis, if normal.   Her history, as presented by her, is a bit scattered and hard to follow.  So, will work on education and perhaps having her document her pain specifically when it happens (i.e. Pain and cycle log).    Prentice Docker, MD 06/25/2017 12:02 PM

## 2017-06-28 LAB — IGP,CTNGTV,RFX APTIMA HPV ASCU
CHLAMYDIA, NUC. ACID AMP: NEGATIVE
GONOCOCCUS, NUC. ACID AMP: NEGATIVE
PAP SMEAR COMMENT: 0
Trich vag by NAA: NEGATIVE

## 2017-06-28 LAB — HPV APTIMA: HPV Aptima: NEGATIVE

## 2017-07-02 ENCOUNTER — Ambulatory Visit (INDEPENDENT_AMBULATORY_CARE_PROVIDER_SITE_OTHER): Payer: Medicaid Other | Admitting: Obstetrics and Gynecology

## 2017-07-02 ENCOUNTER — Ambulatory Visit (INDEPENDENT_AMBULATORY_CARE_PROVIDER_SITE_OTHER): Payer: Medicaid Other

## 2017-07-02 VITALS — BP 118/70 | Ht 63.0 in | Wt 124.0 lb

## 2017-07-02 DIAGNOSIS — R102 Pelvic and perineal pain: Secondary | ICD-10-CM

## 2017-07-02 DIAGNOSIS — N809 Endometriosis, unspecified: Secondary | ICD-10-CM | POA: Diagnosis not present

## 2017-07-02 DIAGNOSIS — N941 Unspecified dyspareunia: Secondary | ICD-10-CM

## 2017-07-02 DIAGNOSIS — N921 Excessive and frequent menstruation with irregular cycle: Secondary | ICD-10-CM | POA: Diagnosis not present

## 2017-07-02 MED ORDER — NORETHINDRONE ACETATE 5 MG PO TABS: 5.0000 mg | ORAL_TABLET | Freq: Every day | ORAL | 2 refills | Status: DC

## 2017-07-02 NOTE — Progress Notes (Signed)
Gynecology Ultrasound Follow Up   Chief Complaint  Patient presents with  . Follow-up  . Pelvic Pain   History of Present Illness: Patient is a 31 y.o. female who presents today for ultrasound evaluation of pelvic pain.  Ultrasound demonstrates the following findings Adnexa: no masses seen  Uterus: anteverted with endometrial stripe  6.3 mm Additional: essentially normal ultrasound  Past Medical History:  Diagnosis Date  . Endometriosis   . Family history of ovarian cancer   . Genetic testing of female 09/2016   TC=8.8% PER [facility]  . Spontaneous abortion   . Unspecified blood type, rh negative     Past Surgical History:  Procedure Laterality Date  . DIAGNOSTIC LAPAROSCOPY  04/26/2010   IUD EXPULSION/PELVIC PAIN  . DILATION AND CURETTAGE OF UTERUS  08/10/10; 08/06/07   HYDATIIDIFORM MOLE/POC, POC  . gallstones  sept 18, 2007  . HYSTEROSCOPY  04/26/2010   IUD EXPULSION /PELVIC PAIN  . TUBAL LIGATION  01/2012   SDJ   Family History  Problem Relation Age of Onset  . Diabetes Mother   . Hypertension Mother   . Cancer Mother 78       UTERINE  . Cancer Sister 20       CX  . Diabetes Maternal Uncle   . Diabetes Maternal Grandmother   . Hypertension Maternal Grandmother   . Lung disease Maternal Grandfather   . Cancer Maternal Grandfather        LUNG  . Heart disease Paternal Grandmother   . Hypertension Paternal Grandmother     Social History   Social History  . Marital status: Single    Spouse name: N/A  . Number of children: 3  . Years of education: 12   Occupational History  . UNEMPLOYED    Social History Main Topics  . Smoking status: Never Smoker  . Smokeless tobacco: Never Used  . Alcohol use No  . Drug use: No  . Sexual activity: Not Currently    Birth control/ protection: Surgical   Other Topics Concern  . Not on file   Social History Narrative  . No narrative on file    Allergies  Allergen Reactions  . Fish-Derived Products  Anaphylaxis  . Iodine   . Tramadol Hives    Prior to Admission medications   Medication Sig Start Date End Date Taking? Authorizing Provider  prenatal vitamin w/FE, FA (PRENATAL 1 + 1) 27-1 MG TABS Take 1 tablet by mouth daily.      [provider]    Physical Exam BP 118/70   Ht '5\' 3"'$  (1.6 m)   Wt 124 lb (56.2 kg)   LMP 06/18/2017   BMI 21.97 kg/m    General: NAD HEENT: normocephalic, anicteric Pulmonary: No increased work of breathing Extremities: no edema, erythema, or tenderness Neurologic: Grossly intact, normal gait Psychiatric: mood appropriate, affect full   Assessment: 31 y.o. [id number] with pelvic pain and history of endometriosis  Plan: Problem List Items Addressed This Visit    Pelvic pain   Relevant Medications   norethindrone (AYGESTIN) 5 MG tablet   Endometriosis - Primary   Relevant Medications   norethindrone (AYGESTIN) 5 MG tablet     15 minutes spent in face to face discussion with > 50% spent in counseling and management of her pelvic pain and endometriosis and long term management.   Return in about 6 weeks (around 08/13/2017) for follow up pelvic pain.   Prentice Docker, MD 07/02/2017 6:25 PM

## 2017-07-31 ENCOUNTER — Encounter: Payer: Self-pay | Admitting: Emergency Medicine

## 2017-07-31 ENCOUNTER — Emergency Department
Admission: EM | Admit: 2017-07-31 | Discharge: 2017-07-31 | Disposition: A | Discharge status: Home / Self Care | Payer: Medicaid Other | Attending: Emergency Medicine | Admitting: Emergency Medicine

## 2017-07-31 DIAGNOSIS — R509 Fever, unspecified: Secondary | ICD-10-CM | POA: Insufficient documentation

## 2017-07-31 DIAGNOSIS — Z79899 Other long term (current) drug therapy: Secondary | ICD-10-CM | POA: Insufficient documentation

## 2017-07-31 DIAGNOSIS — B349 Viral infection, unspecified: Secondary | ICD-10-CM

## 2017-07-31 LAB — CBC
HEMATOCRIT: 38.4 % (ref 35.0–47.0)
Hemoglobin: 13.9 g/dL (ref 12.0–16.0)
MCH: 32.8 pg (ref 26.0–34.0)
MCHC: 36.2 g/dL — ABNORMAL HIGH (ref 32.0–36.0)
MCV: 90.6 fL (ref 80.0–100.0)
PLATELETS: 196 10*3/uL (ref 150–440)
RBC: 4.24 MIL/uL (ref 3.80–5.20)
RDW: 12.1 % (ref 11.5–14.5)
WBC: 6 10*3/uL (ref 3.6–11.0)

## 2017-07-31 LAB — URINALYSIS, COMPLETE (UACMP) WITH MICROSCOPIC
BACTERIA UA: NONE SEEN
Bilirubin Urine: NEGATIVE
Glucose, UA: NEGATIVE mg/dL
Hgb urine dipstick: NEGATIVE
KETONES UR: NEGATIVE mg/dL
Nitrite: NEGATIVE
Protein, ur: 30 mg/dL — AB
Specific Gravity, Urine: 1.023 (ref 1.005–1.030)
pH: 8 (ref 5.0–8.0)

## 2017-07-31 LAB — POCT PREGNANCY, URINE: PREG TEST UR: NEGATIVE

## 2017-07-31 LAB — COMPREHENSIVE METABOLIC PANEL
ALT: 17 U/L (ref 14–54)
AST: 24 U/L (ref 15–41)
Albumin: 4.5 g/dL (ref 3.5–5.0)
Alkaline Phosphatase: 64 U/L (ref 38–126)
Anion gap: 6 (ref 5–15)
BUN: 11 mg/dL (ref 6–20)
CHLORIDE: 104 mmol/L (ref 101–111)
CO2: 27 mmol/L (ref 22–32)
CREATININE: 0.61 mg/dL (ref 0.44–1.00)
Calcium: 9.1 mg/dL (ref 8.9–10.3)
GFR calc Af Amer: >60 mL/min (ref >60)
GFR calc non Af Amer: >60 mL/min (ref >60)
Glucose, Bld: 78 mg/dL (ref 65–99)
POTASSIUM: 3.7 mmol/L (ref 3.5–5.1)
Sodium: 137 mmol/L (ref 135–145)
Total Bilirubin: 0.9 mg/dL (ref 0.3–1.2)
Total Protein: 7.9 g/dL (ref 6.5–8.1)

## 2017-07-31 LAB — LIPASE, BLOOD: LIPASE: 29 U/L (ref 11–51)

## 2017-07-31 MED ORDER — ACETAMINOPHEN 500 MG PO TABS
1000.0000 mg | ORAL_TABLET | Freq: Once | ORAL | Status: AC
  Administered 2017-07-31: 1000 mg via ORAL
  Filled 2017-07-31: qty 2

## 2017-07-31 MED ORDER — KETOROLAC TROMETHAMINE 60 MG/2ML IM SOLN
60.0000 mg | Freq: Once | INTRAMUSCULAR | Status: AC
  Administered 2017-07-31: 60 mg via INTRAMUSCULAR

## 2017-07-31 MED ORDER — KETOROLAC TROMETHAMINE 60 MG/2ML IM SOLN
INTRAMUSCULAR | Status: AC
  Filled 2017-07-31: qty 2

## 2017-07-31 MED ORDER — ACETAMINOPHEN 500 MG PO TABS
ORAL_TABLET | ORAL | Status: AC
  Filled 2017-07-31: qty 2

## 2017-07-31 MED ORDER — NAPROXEN 500 MG PO TABS: 500.0000 mg | ORAL_TABLET | Freq: Two times a day (BID) | ORAL | 0 refills | Status: DC

## 2017-07-31 NOTE — Discharge Instructions (Signed)
Please seek medical attention for any high fevers, chest pain, shortness of breath, change in behavior, persistent vomiting, bloody stool or any other new or concerning symptoms.  

## 2017-07-31 NOTE — ED Notes (Signed)
Reviewed d/c instructions, follow-up care, prescription, use of OTC antipyretics with patient. Pt verbalized understanding.

## 2017-07-31 NOTE — ED Triage Notes (Addendum)
Pt c/o generalized body aches today with fever 103 at work. Has not taken medication for fever today.  Both ears also hurt. Denies cough. Has also had headache that hurts all over. C/o vomiting that started before coming as well.

## 2017-07-31 NOTE — ED Provider Notes (Signed)
[facility] Emergency Department Provider Note   ____________________________________________   I have reviewed the triage vital signs and the nursing notes.   HISTORY  Chief Complaint Fever   History limited by: Not Limited   HPI Denise Bryan is a 31 y.o. female who presents to the emergency department today because of concerns for fevers and body aches. Patient states that her symptoms started this morning. Last night when she went to bed she had a little bit of a headache. This morning however the fevers and chills, worse. She feels like she is having associated body aches. These are severe. she has not had any known sick contacts. The patient denies any urinary symptoms. She denies any change in defecation. She did have a couple episodes of vomiting on the way into the emergency department.   Past Medical History:  Diagnosis Date  . Endometriosis   . Family history of ovarian cancer   . Genetic testing of female 09/2016   TC=8.8% PER [facility]  . Spontaneous abortion   . Unspecified blood type, rh negative     Patient Active Problem List   Diagnosis Date Noted  . Endometriosis 07/02/2017  . Pelvic pain 06/23/2017  . Dyspareunia, female 06/23/2017  . Menorrhagia with irregular cycle 06/23/2017    Past Surgical History:  Procedure Laterality Date  . DIAGNOSTIC LAPAROSCOPY  04/26/2010   IUD EXPULSION/PELVIC PAIN  . DILATION AND CURETTAGE OF UTERUS  08/10/10; 08/06/07   HYDATIIDIFORM MOLE/POC, POC  . gallstones  sept 18, 2007  . HYSTEROSCOPY  04/26/2010   IUD EXPULSION /PELVIC PAIN  . TUBAL LIGATION  01/2012   SDJ    Prior to Admission medications   Medication Sig Start Date End Date Taking? Authorizing Provider  acetaminophen-codeine (TYLENOL #3) 300-30 MG tablet Take 1 tablet by mouth every 8 (eight) hours as needed for moderate pain. Patient not taking: Reported on 06/23/2017 11/05/16   Menshew, Dannielle Karvonen, PA-C  azithromycin  (ZITHROMAX Z-PAK) 250 MG tablet Take 2 tablets (500 mg) on  Day 1,  followed by 1 tablet (250 mg) once daily on Days 2 through 5. Patient not taking: Reported on 06/23/2017 11/05/16   Menshew, Dannielle Karvonen, PA-C  baclofen (LIORESAL) 10 MG tablet Take 1 tablet (10 mg total) by mouth 3 (three) times daily. Patient not taking: Reported on 06/23/2017 06/29/16   Beers, Pierce Crane, PA-C  benzonatate (TESSALON PERLES) 100 MG capsule Take 1 capsule (100 mg total) by mouth 3 (three) times daily as needed for cough (Take 1-2 per dose). Patient not taking: Reported on 06/23/2017 11/05/16   Menshew, Dannielle Karvonen, PA-C  butalbital-acetaminophen-caffeine (FIORICET) 2247081531 MG tablet Take 1-2 tablets by mouth every 6 (six) hours as needed for headache. Patient not taking: Reported on 06/23/2017 06/29/16   Arlyss Repress, PA-C  etodolac (LODINE) 200 MG capsule Take 1 capsule (200 mg total) by mouth 2 (two) times daily. Patient not taking: Reported on 06/23/2017 04/01/15   Loney Hering, MD  norethindrone (AYGESTIN) 5 MG tablet Take 1 tablet (5 mg total) by mouth daily. 07/02/17   Will Bonnet, MD  prenatal vitamin w/FE, FA (PRENATAL 1 + 1) 27-1 MG TABS Take 1 tablet by mouth daily.      [provider]    Allergies Fish-derived products; Iodine; and Tramadol  Family History  Problem Relation Age of Onset  . Diabetes Mother   . Hypertension Mother   . Cancer Mother 12  UTERINE  . Cancer Sister 20       CX  . Diabetes Maternal Uncle   . Diabetes Maternal Grandmother   . Hypertension Maternal Grandmother   . Lung disease Maternal Grandfather   . Cancer Maternal Grandfather        LUNG  . Heart disease Paternal Grandmother   . Hypertension Paternal Grandmother     Social History Social History  Substance Use Topics  . Smoking status: Never Smoker  . Smokeless tobacco: Never Used  . Alcohol use No    Review of Systems Constitutional: positive for fevers Eyes: No visual  changes. ENT: positive for bilateral ear pain Cardiovascular: Denies chest pain. Respiratory: Denies shortness of breath. Gastrointestinal: positives for nausea and vomiting Genitourinary: Negative for dysuria. Musculoskeletal: Negative for back pain. Skin: Negative for rash. Neurological: positive for headache  ____________________________________________   PHYSICAL EXAM:  VITAL SIGNS: ED Triage Vitals  Enc Vitals Group     BP 07/31/17 1833 120/74     Pulse Rate 07/31/17 1833 97     Resp 07/31/17 1833 18     Temp 07/31/17 1832 99.1 F (37.3 C)     Temp Source 07/31/17 1832 Oral     SpO2 07/31/17 1833 99 %     Weight 07/31/17 1832 125 lb (56.7 kg)     Height 07/31/17 1832 _0  (1.626 m)     Head Circumference --      Peak Flow --      Pain Score 07/31/17 1831 9   Constitutional: Alert and oriented. Well appearing and in no distress. Eyes: Conjunctivae are normal.  ENT   Head: Normocephalic and atraumatic.tympanic membranes within normal limits bilaterally.   Nose: No congestion/rhinnorhea.   Mouth/Throat: Mucous membranes are moist.   Neck: No stridor. Hematological/Lymphatic/Immunilogical: No cervical lymphadenopathy. Cardiovascular: Normal rate, regular rhythm.  No murmurs, rubs, or gallops. Respiratory: Normal respiratory effort without tachypnea nor retractions. Breath sounds are clear and equal bilaterally. No wheezes/rales/rhonchi. Gastrointestinal: Soft and non tender. No rebound. No guarding.  Genitourinary: Deferred Musculoskeletal: Normal range of motion in all extremities. No lower extremity edema. Neurologic:  Normal speech and language. No gross focal neurologic deficits are appreciated.  Skin:  Skin is warm, dry and intact. No rash noted. Psychiatric: Mood and affect are normal. Speech and behavior are normal. Patient exhibits appropriate insight and judgment.  ____________________________________________    LABS (pertinent  positives/negatives)  Labs Reviewed  CBC - Abnormal; Notable for the following:       Result Value   MCHC 36.2 (*)    All other components within normal limits  URINALYSIS, COMPLETE (UACMP) WITH MICROSCOPIC - Abnormal; Notable for the following:    Color, Urine YELLOW (*)    APPearance HAZY (*)    Protein, ur 30 (*)    Leukocytes, UA TRACE (*)    Squamous Epithelial / LPF 6-30 (*)    All other components within normal limits  URINE CULTURE  LIPASE, BLOOD  COMPREHENSIVE METABOLIC PANEL  POC URINE PREG, ED  POCT PREGNANCY, URINE     ____________________________________________   EKG  None  ____________________________________________    RADIOLOGY  None  ____________________________________________   PROCEDURES  Procedures  ____________________________________________   INITIAL IMPRESSION / ASSESSMENT AND PLAN / ED COURSE  Pertinent labs & imaging results that were available during my care of the patient were reviewed by me and considered in my medical decision making (see chart for details).  patient presented to the emergency department today  because of concerns for fevers, body aches, headache. Today patient likely has a viral illness. Urine did have some leukocytes however was not a clean sample. I discussed this with the patient. She feels comfortable deferring antibiotics at this point given lack of UTI symptoms. Will send urine off for culture.  ____________________________________________   FINAL CLINICAL IMPRESSION(S) / ED DIAGNOSES  Final diagnoses:  Febrile illness  Viral syndrome     Note: This dictation was prepared with Dragon dictation. Any transcriptional errors that result from this process are unintentional     Nance Pear, MD 07/31/17 2124

## 2017-08-03 LAB — URINE CULTURE

## 2017-08-04 NOTE — Progress Notes (Signed)
ED Antimicrobial Stewardship Positive Culture Follow Up   Denise Bryan is an 31 y.o. female who presented to [facility] on 07/31/2017 with a chief complaint of  Chief Complaint  Patient presents with  . Fever    Recent Results (from the past 720 hour(s))  Urine Culture     Status: Abnormal   Collection Time: 07/31/17  6:34 PM  Result Value Ref Range Status   Specimen Description URINE, RANDOM  Final   Special Requests NONE  Final   Culture >=100,000 COLONIES/mL ESCHERICHIA COLI (A)  Final   Report Status 08/03/2017 FINAL  Final   Organism ID, Bacteria ESCHERICHIA COLI (A)  Final      Susceptibility   Escherichia coli - MIC*    AMPICILLIN >=32 RESISTANT Resistant     CEFAZOLIN <=4 SENSITIVE Sensitive     CEFTRIAXONE <=1 SENSITIVE Sensitive     CIPROFLOXACIN >=4 RESISTANT Resistant     GENTAMICIN <=1 SENSITIVE Sensitive     IMIPENEM <=0.25 SENSITIVE Sensitive     NITROFURANTOIN <=16 SENSITIVE Sensitive     TRIMETH/SULFA <=20 SENSITIVE Sensitive     AMPICILLIN/SULBACTAM 8 SENSITIVE Sensitive     PIP/TAZO <=4 SENSITIVE Sensitive     Extended ESBL NEGATIVE Sensitive     * >=100,000 COLONIES/mL ESCHERICHIA COLI    [x]  Patient discharged originally without antimicrobial agent and treatment is now indicated  New antibiotic prescription: Cephalexin 500 mg PO BID x 14 days  ED Provider: Dr. Jaci Standard with patient who reports still feeling sick with flank pain. Explained culture results and answered patients questions. Patient confirmed that she has no antibiotic allergies. Called prescription to [facility] on [address] in [place], [place] per patients request.   Lenis Noon, PharmD 08/04/17 3:38 PM

## 2017-08-13 ENCOUNTER — Ambulatory Visit (INDEPENDENT_AMBULATORY_CARE_PROVIDER_SITE_OTHER): Payer: Medicaid Other | Admitting: Obstetrics and Gynecology

## 2017-08-13 ENCOUNTER — Telehealth: Payer: Self-pay | Admitting: Obstetrics and Gynecology

## 2017-08-13 VITALS — BP 114/70 | Wt 126.0 lb

## 2017-08-13 DIAGNOSIS — R102 Pelvic and perineal pain: Secondary | ICD-10-CM | POA: Diagnosis not present

## 2017-08-13 DIAGNOSIS — N809 Endometriosis, unspecified: Secondary | ICD-10-CM

## 2017-08-13 DIAGNOSIS — N941 Unspecified dyspareunia: Secondary | ICD-10-CM | POA: Diagnosis not present

## 2017-08-13 NOTE — Telephone Encounter (Signed)
Patient is aware of OR on 08/21/17 @ 4pm or later. Consents day of surgery.

## 2017-08-13 NOTE — Progress Notes (Signed)
Obstetrics & Gynecology Office Visit   Chief Complaint  Patient presents with  . Follow-up  Pelvic pain  History of Present Illness: 31 y.o. G4P3013 female who presents in follow up for endometriosis-related pelvic pain treatment.  She was started on norethindrone at her last visit about 6 weeks ago.  She states that she has had minimal relief from her pain.  She has no side effects from the medication. She continues to have irregular vaginal bleeding.  She would like to try something else, specifically surgery for diagnosis and possible therapy of pain.    Past Medical History:  Diagnosis Date  . Endometriosis   . Family history of ovarian cancer   . Genetic testing of female 09/2016   TC=8.8% PER [facility]  . Spontaneous abortion   . Unspecified blood type, rh negative     Past Surgical History:  Procedure Laterality Date  . DIAGNOSTIC LAPAROSCOPY  04/26/2010   IUD EXPULSION/PELVIC PAIN  . DILATION AND CURETTAGE OF UTERUS  08/10/10; 08/06/07   HYDATIIDIFORM MOLE/POC, POC  . gallstones  sept 18, 2007  . HYSTEROSCOPY  04/26/2010   IUD EXPULSION /PELVIC PAIN  . TUBAL LIGATION  01/2012   SDJ    Gynecologic History: Patient's last menstrual period was 06/17/2017.  Obstetric History: G4P3013  Family History  Problem Relation Age of Onset  . Diabetes Mother   . Hypertension Mother   . Cancer Mother 40       UTERINE  . Cancer Sister 20       CX  . Diabetes Maternal Uncle   . Diabetes Maternal Grandmother   . Hypertension Maternal Grandmother   . Lung disease Maternal Grandfather   . Cancer Maternal Grandfather        LUNG  . Heart disease Paternal Grandmother   . Hypertension Paternal Grandmother     Social History   Social History  . Marital status: Single    Spouse name: N/A  . Number of children: 3  . Years of education: 12   Occupational History  . UNEMPLOYED    Social History Main Topics  . Smoking status: Never Smoker  . Smokeless tobacco: Never Used    . Alcohol use No  . Drug use: No  . Sexual activity: Not Currently    Birth control/ protection: Surgical   Other Topics Concern  . Not on file   Social History Narrative  . No narrative on file    Allergies  Allergen Reactions  . Fish-Derived Products Anaphylaxis  . Iodine   . Tramadol Hives    Prior to Admission medications   Medication Sig Start Date End Date Taking? Authorizing Provider  norethindrone (AYGESTIN) 5 MG tablet Take 1 tablet (5 mg total) by mouth daily. 07/02/17  Yes Jackson, Stephen D, MD  prenatal vitamin w/FE, FA (PRENATAL 1 + 1) 27-1 MG TABS Take 1 tablet by mouth daily.      [provider]    Review of Systems  Constitutional: Negative for chills, diaphoresis, fever, malaise/fatigue and weight loss.  Respiratory: Negative for cough, shortness of breath and wheezing.   Cardiovascular: Negative for chest pain, orthopnea and leg swelling.  Gastrointestinal: Positive for abdominal pain (pelvic pain). Negative for blood in stool, constipation, diarrhea, heartburn, nausea and vomiting.  Genitourinary: Negative for dysuria, frequency, hematuria and urgency.  Musculoskeletal: Negative.   Skin: Negative for itching and rash.  Neurological: Negative.  Negative for weakness.  Psychiatric/Behavioral: Negative.      Physical Exam BP   114/70   Wt 126 lb (57.2 kg)   LMP 06/17/2017   BMI 21.63 kg/m  Patient's last menstrual period was 06/17/2017. Physical Exam  Constitutional: She appears well-developed and well-nourished. No distress.  HENT:  Head: Normocephalic and atraumatic.  Pulmonary/Chest: Effort normal.  Psychiatric: She has a normal mood and affect. Her behavior is normal. Judgment normal.    Assessment: 31 y.o. G4P3013 female with chronic pelvic pain and likely endometriosis. Plan: Problem List Items Addressed This Visit    Pelvic pain - Primary   Dyspareunia, female   Endometriosis     Long (about 20 minute) discussion held with  patient regarding treatment options. Discussed different types of medical therapies. We also discussed surgery for diagnosis and possible therapeutic benefit. The patient strongly desires a surgery for diagnosis and potential therapy. She understands that even in the case of endometriosis there may be minimal to no findings on laparoscopy. Will schedule surgery for soon.  Continue norethindrone for now.  20 minutes spent in face to face discussion with > 50% spent in counseling and management of her chronic pelvic pain and dyspareunia.   Stephen Jackson, MD 08/13/2017 8:53 AM    

## 2017-08-13 NOTE — Telephone Encounter (Signed)
-----   Message from Will Bonnet, MD sent at 08/13/2017  8:56 AM EDT ----- Regarding: Schedule surgery Surgery Booking Request Patient Full Name:   MRN: [id number]  DOB: [birth year]  Surgeon: Prentice Docker, MD  Requested Surgery Date and Time: discuss with patient Primary Diagnosis AND Code: chronic pelvic pain, female Secondary Diagnosis and Code:  Surgical Procedure: diagnostic laparoscopy L&D Notification: No Admission Status: same day surgery Length of Surgery: 45 minutes Special Case Needs: none H&P: TBD(date) (can sign consents in pre-op, if she is ok for telephone interview) Phone Interview???: tbd. Should be ok Interpreter: Language:  Medical Clearance: none needed Special Scheduling Instructions: none

## 2017-08-14 ENCOUNTER — Telehealth: Payer: Self-pay | Admitting: Obstetrics and Gynecology

## 2017-08-14 NOTE — Telephone Encounter (Signed)
Pt is calling Izora Gala back due missed call. Please advise. May leave an detail message

## 2017-08-18 ENCOUNTER — Encounter
Admission: RE | Admit: 2017-08-18 | Discharge: 2017-08-18 | Disposition: A | Discharge status: Home / Self Care | Payer: Medicaid Other | Source: Ambulatory Visit | Attending: Obstetrics and Gynecology | Admitting: Obstetrics and Gynecology

## 2017-08-18 NOTE — Patient Instructions (Signed)
Your procedure is scheduled on: 08-21-17 Report to Same Day Surgery 2nd floor medical mall [facility] Entrance-take elevator on left to 2nd floor.  Check in with surgery information desk.) To find out your arrival time please call [phone] between 1PM - 3PM on 08-20-17 08-20-17 Remember: Instructions that are not followed completely may result in serious medical risk, up to and including death, or upon the discretion of your surgeon and anesthesiologist your surgery may need to be rescheduled.    _x___ 1. Do not eat food after midnight the night before your procedure. You may drink clear liquids up to 2 hours before you are scheduled to arrive at the hospital for your procedure.  Do not drink clear liquids within 2 hours of your scheduled arrival to the hospital.  Clear liquids include  --Water or Apple juice without pulp  --Clear carbohydrate beverage such as ClearFast or Gatorade  --Black Coffee or Clear Tea (No milk, no creamers, do not add anything to the coffee or Tea Type 1 and type 2 diabetics should only drink water.  No gum chewing or hard candies.     __x__ 2. No Alcohol for 24 hours before or after surgery.   __x__3. No Smoking for 24 prior to surgery.   ____  4. Bring all medications with you on the day of surgery if instructed.    __x__ 5. Notify your doctor if there is any change in your medical condition     (cold, fever, infections).     Do not wear jewelry, make-up, hairpins, clips or nail polish.  Do not wear lotions, powders, or perfumes. You may wear deodorant.  Do not shave 48 hours prior to surgery. Men may shave face and neck.  Do not bring valuables to the hospital.    [facility] is not responsible for any belongings or valuables.               Contacts, dentures or bridgework may not be worn into surgery.  Leave your suitcase in the car. After surgery it may be brought to your room.  For patients admitted to the hospital, discharge time is determined  by your treatment team.   Patients discharged the day of surgery will not be allowed to drive home.  You will need someone to drive you home and stay with you the night of your procedure.    Please read over the following fact sheets that you were given:    ____ Take anti-hypertensive listed below, cardiac, seizure, asthma,     anti-reflux and psychiatric medicines. These include:  1. NONE  2.  3.  4.  5.  6.  ____Fleets enema or Magnesium Citrate as directed.   ____ Use CHG Soap or sage wipes as directed on instruction sheet   ____ Use inhalers on the day of surgery and bring to hospital day of surgery  ____ Stop Metformin and Janumet 2 days prior to surgery.    ____ Take 1/2 of usual insulin dose the night before surgery and none on the morning     surgery.   ____ Follow recommendations from Cardiologist, Pulmonologist or PCP regarding          stopping Aspirin, Coumadin, Plavix ,Eliquis, Effient, or Pradaxa, and Pletal.  X____Stop Anti-inflammatories such as Advil, Aleve, IBUPROFEN, Motrin, Naproxen, Naprosyn, Goodies powders or aspirin products NOW-OK to take Tylenol    ____ Stop supplements until after surgery.   ____ Bring C-Pap to the hospital.

## 2017-08-21 ENCOUNTER — Ambulatory Visit
Admission: RE | Admit: 2017-08-21 | Discharge: 2017-08-21 | Disposition: A | Discharge status: Home / Self Care | Payer: Medicaid Other | Source: Ambulatory Visit | Attending: Obstetrics and Gynecology | Admitting: Obstetrics and Gynecology

## 2017-08-21 ENCOUNTER — Encounter
Admission: RE | Disposition: A | Discharge status: Home / Self Care | Payer: Self-pay | Source: Ambulatory Visit | Attending: Obstetrics and Gynecology

## 2017-08-21 ENCOUNTER — Ambulatory Visit: Payer: Medicaid Other | Admitting: Anesthesiology

## 2017-08-21 ENCOUNTER — Encounter: Payer: Self-pay | Admitting: Emergency Medicine

## 2017-08-21 DIAGNOSIS — G8929 Other chronic pain: Secondary | ICD-10-CM | POA: Insufficient documentation

## 2017-08-21 DIAGNOSIS — N809 Endometriosis, unspecified: Secondary | ICD-10-CM | POA: Diagnosis not present

## 2017-08-21 DIAGNOSIS — N941 Unspecified dyspareunia: Secondary | ICD-10-CM | POA: Diagnosis present

## 2017-08-21 DIAGNOSIS — R102 Pelvic and perineal pain: Secondary | ICD-10-CM | POA: Diagnosis present

## 2017-08-21 DIAGNOSIS — N390 Urinary tract infection, site not specified: Secondary | ICD-10-CM | POA: Diagnosis present

## 2017-08-21 HISTORY — PX: LAPAROSCOPY: SHX197

## 2017-08-21 LAB — PREGNANCY, URINE: Preg Test, Ur: NEGATIVE

## 2017-08-21 LAB — POCT PREGNANCY, URINE: Preg Test, Ur: NEGATIVE

## 2017-08-21 SURGERY — LAPAROSCOPY, DIAGNOSTIC
Anesthesia: General | Site: Abdomen | Wound class: Clean Contaminated

## 2017-08-21 MED ORDER — DEXAMETHASONE SODIUM PHOSPHATE 10 MG/ML IJ SOLN
INTRAMUSCULAR | Status: AC
  Filled 2017-08-21: qty 1

## 2017-08-21 MED ORDER — BUPIVACAINE HCL (PF) 0.5 % IJ SOLN
INTRAMUSCULAR | Status: AC
Start: 2017-08-21 — End: 2017-08-21
  Filled 2017-08-21: qty 30

## 2017-08-21 MED ORDER — DEXAMETHASONE SODIUM PHOSPHATE 10 MG/ML IJ SOLN
INTRAMUSCULAR | Status: DC | PRN
  Administered 2017-08-21: 10 mg via INTRAVENOUS

## 2017-08-21 MED ORDER — HYDROMORPHONE HCL 1 MG/ML IJ SOLN
INTRAMUSCULAR | Status: AC
  Administered 2017-08-21: 0.5 mg via INTRAVENOUS
  Filled 2017-08-21: qty 1

## 2017-08-21 MED ORDER — FENTANYL CITRATE (PF) 100 MCG/2ML IJ SOLN
INTRAMUSCULAR | Status: DC | PRN
  Administered 2017-08-21: 100 ug via INTRAVENOUS

## 2017-08-21 MED ORDER — EPHEDRINE SULFATE 50 MG/ML IJ SOLN
INTRAMUSCULAR | Status: AC
  Filled 2017-08-21: qty 1

## 2017-08-21 MED ORDER — HYDROMORPHONE HCL 1 MG/ML IJ SOLN
0.5000 mg | INTRAMUSCULAR | Status: DC | PRN
Start: 2017-08-21 — End: 2017-08-21
  Administered 2017-08-21 (×2): 0.5 mg via INTRAVENOUS

## 2017-08-21 MED ORDER — MIDAZOLAM HCL 5 MG/5ML IJ SOLN
INTRAMUSCULAR | Status: DC | PRN
  Administered 2017-08-21: 2 mg via INTRAVENOUS

## 2017-08-21 MED ORDER — PROPOFOL 10 MG/ML IV BOLUS
INTRAVENOUS | Status: AC
  Filled 2017-08-21: qty 20

## 2017-08-21 MED ORDER — FENTANYL CITRATE (PF) 100 MCG/2ML IJ SOLN
25.0000 ug | INTRAMUSCULAR | Status: AC | PRN
Start: 2017-08-21 — End: 2017-08-21
  Administered 2017-08-21 (×6): 25 ug via INTRAVENOUS

## 2017-08-21 MED ORDER — SUCCINYLCHOLINE CHLORIDE 20 MG/ML IJ SOLN
INTRAMUSCULAR | Status: DC | PRN
  Administered 2017-08-21: 100 mg via INTRAVENOUS

## 2017-08-21 MED ORDER — ONDANSETRON HCL 4 MG/2ML IJ SOLN
INTRAMUSCULAR | Status: DC | PRN
  Administered 2017-08-21: 4 mg via INTRAVENOUS

## 2017-08-21 MED ORDER — ROCURONIUM BROMIDE 100 MG/10ML IV SOLN
INTRAVENOUS | Status: DC | PRN
  Administered 2017-08-21: 5 mg via INTRAVENOUS
  Administered 2017-08-21: 25 mg via INTRAVENOUS

## 2017-08-21 MED ORDER — SUGAMMADEX SODIUM 200 MG/2ML IV SOLN
INTRAVENOUS | Status: DC | PRN
  Administered 2017-08-21: 120 mg via INTRAVENOUS

## 2017-08-21 MED ORDER — PROMETHAZINE HCL 25 MG/ML IJ SOLN: 6.2500 mg | INTRAMUSCULAR | Status: DC | PRN

## 2017-08-21 MED ORDER — LACTATED RINGERS IV SOLN
INTRAVENOUS | Status: DC
  Administered 2017-08-21 (×2): via INTRAVENOUS

## 2017-08-21 MED ORDER — BUPIVACAINE HCL 0.5 % IJ SOLN
INTRAMUSCULAR | Status: DC | PRN
  Administered 2017-08-21: 7 mL

## 2017-08-21 MED ORDER — SEVOFLURANE IN SOLN
RESPIRATORY_TRACT | Status: AC
  Filled 2017-08-21: qty 250

## 2017-08-21 MED ORDER — FAMOTIDINE 20 MG PO TABS
20.0000 mg | ORAL_TABLET | Freq: Once | ORAL | Status: AC
  Administered 2017-08-21: 20 mg via ORAL

## 2017-08-21 MED ORDER — KETOROLAC TROMETHAMINE 30 MG/ML IJ SOLN
INTRAMUSCULAR | Status: AC
  Filled 2017-08-21: qty 1

## 2017-08-21 MED ORDER — ACETAMINOPHEN NICU IV SYRINGE 10 MG/ML
INTRAVENOUS | Status: AC
  Filled 2017-08-21: qty 1

## 2017-08-21 MED ORDER — FAMOTIDINE 20 MG PO TABS
ORAL_TABLET | ORAL | Status: AC
  Administered 2017-08-21: 20 mg via ORAL
  Filled 2017-08-21: qty 1

## 2017-08-21 MED ORDER — SUGAMMADEX SODIUM 200 MG/2ML IV SOLN
INTRAVENOUS | Status: AC
  Filled 2017-08-21: qty 2

## 2017-08-21 MED ORDER — KETOROLAC TROMETHAMINE 30 MG/ML IJ SOLN
INTRAMUSCULAR | Status: DC | PRN
  Administered 2017-08-21: 30 mg via INTRAVENOUS

## 2017-08-21 MED ORDER — CEFTRIAXONE SODIUM 1 G IJ SOLR
INTRAMUSCULAR | Status: AC
Start: 2017-08-21 — End: 2017-08-21
  Filled 2017-08-21: qty 10

## 2017-08-21 MED ORDER — IBUPROFEN 200 MG PO TABS: 800.0000 mg | ORAL_TABLET | Freq: Three times a day (TID) | ORAL | 0 refills | Status: DC | PRN

## 2017-08-21 MED ORDER — FENTANYL CITRATE (PF) 100 MCG/2ML IJ SOLN
INTRAMUSCULAR | Status: AC
  Filled 2017-08-21: qty 2

## 2017-08-21 MED ORDER — FENTANYL CITRATE (PF) 100 MCG/2ML IJ SOLN
INTRAMUSCULAR | Status: AC
  Administered 2017-08-21: 25 ug via INTRAVENOUS
  Filled 2017-08-21: qty 2

## 2017-08-21 MED ORDER — DEXTROSE 5 % IV SOLN
INTRAVENOUS | Status: DC | PRN
  Administered 2017-08-21: 1 g via INTRAVENOUS

## 2017-08-21 MED ORDER — MIDAZOLAM HCL 2 MG/2ML IJ SOLN
INTRAMUSCULAR | Status: AC
  Filled 2017-08-21: qty 2

## 2017-08-21 MED ORDER — ACETAMINOPHEN 10 MG/ML IV SOLN
INTRAVENOUS | Status: DC | PRN
  Administered 2017-08-21: 1000 mg via INTRAVENOUS

## 2017-08-21 MED ORDER — LIDOCAINE HCL (CARDIAC) 20 MG/ML IV SOLN
INTRAVENOUS | Status: DC | PRN
  Administered 2017-08-21: 60 mg via INTRAVENOUS

## 2017-08-21 MED ORDER — ROCURONIUM BROMIDE 50 MG/5ML IV SOLN
INTRAVENOUS | Status: AC
  Filled 2017-08-21: qty 1

## 2017-08-21 MED ORDER — SUCCINYLCHOLINE CHLORIDE 20 MG/ML IJ SOLN
INTRAMUSCULAR | Status: AC
  Filled 2017-08-21: qty 1

## 2017-08-21 MED ORDER — ONDANSETRON HCL 4 MG/2ML IJ SOLN
INTRAMUSCULAR | Status: AC
  Filled 2017-08-21: qty 2

## 2017-08-21 MED ORDER — LACTATED RINGERS IV SOLN: INTRAVENOUS | Status: DC

## 2017-08-21 MED ORDER — MEPERIDINE HCL 50 MG/ML IJ SOLN: 6.2500 mg | INTRAMUSCULAR | Status: DC | PRN

## 2017-08-21 MED ORDER — HYDROCODONE-ACETAMINOPHEN 5-325 MG PO TABS: 1.0000 | ORAL_TABLET | Freq: Four times a day (QID) | ORAL | 0 refills | Status: DC | PRN

## 2017-08-21 MED ORDER — EPHEDRINE SULFATE 50 MG/ML IJ SOLN
INTRAMUSCULAR | Status: DC | PRN
  Administered 2017-08-21: 5 mg via INTRAVENOUS

## 2017-08-21 MED ORDER — LIDOCAINE HCL (PF) 2 % IJ SOLN
INTRAMUSCULAR | Status: AC
  Filled 2017-08-21: qty 2

## 2017-08-21 MED ORDER — PROPOFOL 10 MG/ML IV BOLUS
INTRAVENOUS | Status: DC | PRN
  Administered 2017-08-21: 150 mg via INTRAVENOUS

## 2017-08-21 SURGICAL SUPPLY — 51 items
BAG URINE DRAINAGE (UROLOGICAL SUPPLIES) ×2 IMPLANT
BLADE SURG SZ11 CARB STEEL (BLADE) ×2 IMPLANT
CANISTER SUCT 1200ML W/VALVE (MISCELLANEOUS) ×2 IMPLANT
CATH FOLEY 2WAY  5CC 16FR (CATHETERS) ×1
CATH URTH 16FR FL 2W BLN LF (CATHETERS) ×1 IMPLANT
CHLORAPREP W/TINT 26ML (MISCELLANEOUS) ×2 IMPLANT
DERMABOND ADVANCED (GAUZE/BANDAGES/DRESSINGS) ×1
DERMABOND ADVANCED .7 DNX12 (GAUZE/BANDAGES/DRESSINGS) ×1 IMPLANT
DRAPE LEGGINS SURG 28X43 STRL (DRAPES) ×2 IMPLANT
DRAPE SHEET LG 3/4 BI-LAMINATE (DRAPES) ×2 IMPLANT
DRAPE UNDER BUTTOCK W/FLU (DRAPES) ×2 IMPLANT
ELECT REM PT RETURN 9FT ADLT (ELECTROSURGICAL) ×2
ELECTRODE REM PT RTRN 9FT ADLT (ELECTROSURGICAL) ×1 IMPLANT
GLOVE BIO SURGEON STRL SZ7 (GLOVE) ×8 IMPLANT
GLOVE BIOGEL PI IND STRL 7.5 (GLOVE) ×2 IMPLANT
GLOVE BIOGEL PI INDICATOR 7.5 (GLOVE) ×2
GOWN STRL REUS W/ TWL LRG LVL3 (GOWN DISPOSABLE) ×4 IMPLANT
GOWN STRL REUS W/TWL LRG LVL3 (GOWN DISPOSABLE) ×4
IRRIGATION STRYKERFLOW (MISCELLANEOUS) ×1 IMPLANT
IRRIGATOR STRYKERFLOW (MISCELLANEOUS) ×2
IV LACTATED RINGERS 1000ML (IV SOLUTION) ×2 IMPLANT
KIT PINK PAD W/HEAD ARE REST (MISCELLANEOUS) ×2
KIT PINK PAD W/HEAD ARM REST (MISCELLANEOUS) ×1 IMPLANT
KIT RM TURNOVER CYSTO AR (KITS) ×2 IMPLANT
LABEL OR SOLS (LABEL) ×2 IMPLANT
MANIPULATOR VCARE MED CRV RETR (MISCELLANEOUS) ×2 IMPLANT
NDL SAFETY 22GX1.5 (NEEDLE) ×2 IMPLANT
NS IRRIG 500ML POUR BTL (IV SOLUTION) ×2 IMPLANT
PACK LAP CHOLECYSTECTOMY (MISCELLANEOUS) ×2 IMPLANT
PAD OB MATERNITY 4.3X12.25 (PERSONAL CARE ITEMS) ×2 IMPLANT
PAD PREP 24X41 OB/GYN DISP (PERSONAL CARE ITEMS) ×2 IMPLANT
SCISSORS METZENBAUM CVD 33 (INSTRUMENTS) IMPLANT
SHEARS HARMONIC ACE PLUS 36CM (ENDOMECHANICALS) IMPLANT
SLEEVE ENDOPATH XCEL 5M (ENDOMECHANICALS) ×2 IMPLANT
SOL PREP PVP 2OZ (MISCELLANEOUS) ×2
SOLUTION PREP PVP 2OZ (MISCELLANEOUS) ×1 IMPLANT
SURGILUBE 2OZ TUBE FLIPTOP (MISCELLANEOUS) ×2 IMPLANT
SUT MNCRL 4-0 (SUTURE) ×1
SUT MNCRL 4-0 27XMFL (SUTURE) ×1
SUT VIC AB 2-0 UR6 27 (SUTURE) ×2 IMPLANT
SUT VIC AB 3-0 SH 27 (SUTURE) ×2
SUT VIC AB 3-0 SH 27X BRD (SUTURE) ×2 IMPLANT
SUT VIC AB 4-0 SH 27 (SUTURE) ×1
SUT VIC AB 4-0 SH 27XANBCTRL (SUTURE) ×1 IMPLANT
SUT VICRYL 0 AB UR-6 (SUTURE) ×2 IMPLANT
SUTURE MNCRL 4-0 27XMF (SUTURE) ×1 IMPLANT
TROCAR ENDO BLADELESS 11MM (ENDOMECHANICALS) IMPLANT
TROCAR XCEL BLUNT TIP 100MML (ENDOMECHANICALS) ×2 IMPLANT
TROCAR XCEL NON-BLD 5MMX100MML (ENDOMECHANICALS) ×2 IMPLANT
TROCAR XCEL UNIV SLVE 11M 100M (ENDOMECHANICALS) IMPLANT
TUBING INSUFFLATOR HI FLOW (MISCELLANEOUS) ×2 IMPLANT

## 2017-08-21 NOTE — Op Note (Signed)
  Operative Note    Pre-Op Diagnosis:  1) chronic pelvic pain 2) endometriosis  Post-Op Diagnosis:  1) chronic pelvic pain 2) endometriosis  Procedures:  Diagnostic laparoscopy  Primary Surgeon: Prentice Docker, MD   EBL: 10 mL   IVF: 1,200 mL   Urine output: 200 mL  Specimens: none  Drains: none  Complications: None   Disposition: PACU   Condition: Stable   Findings:  1) normal appearing uterus, ovaries, cervix 2) bilateral fallopian tubes with post tubal ligation changes 3) no obvious endometriosis implants in the pelvis  Procedure Summary:  The patient was taken to the operating room where general anesthesia was administered and found to be adequate. She was placed in the dorsal supine lithotomy position in [place] stirrups and prepped and draped in usual sterile fashion. After a timeout was called an indwelling catheter was placed in her bladder. A specimen of urine was obtained for culture given diagnosis of urinary tract infection urine culture on 07/31/17.  A sterile speculum was placed in the vagina and a single-tooth tenaculum was used to grasp the anterior lip of the cervix. A V-care manipulator was affixed to the cervix in accordance with the manufacturer's recommendations. The speculum was removed from the vagina.  Attention was turned to the abdomen where after injection of local anesthetic, an open entry technique was employed to place a Hasson trocar just inferior to the umbilicus.  Verification of entry into the abdomen was obtained using opening pressures. The abdomen was insufflated with CO2. The camera was introduced through the trocar with verification of atraumatic entry.  A 62mm suprapubic port was placed under direct intra-abdominal camera visualization without difficulty.    An inspection of the entire pelvis was undertaken with the above-noted findings.  Given no implants were noted, and she had a prior diagnosis of endometriosis, the surgery was  terminated at this point.    The abdomen was desufflated of CO2. The suprapubic trocar was removed.  The Hasson trocar was removed and the fascia was reapproximated using 0 vicryl in a running fashion. The subcutaneous tissue was reapproximated to close dead space. The skin was closed using #4-0 vicryl in a subcuticular fashion.  The incision sites were closed with surgical skin glue.   Attention was returned to the pelvis.  The V-Care device was removed and the cervix was hemostatic with no bleeding coming from the uterus or cervix.  No instruments or sponges remained in the vagina.  The foley catheter was removed.    The patient tolerated the procedure well.  Sponge, lap, needle, and instrument counts were correct x 2.  VTE prophylaxis: SCDs. Antibiotic prophylaxis: none. She was awakened in the operating room and was taken to the PACU in stable condition. Of note, she did received ceftriaxone 1 gram IV during the procedure given her history of urinary tract infection.   Prentice Docker, MD 08/21/2017 5:38 PM

## 2017-08-21 NOTE — Discharge Instructions (Addendum)
AMBULATORY SURGERY  DISCHARGE INSTRUCTIONS   1) The drugs that you were given will stay in your system until tomorrow so for the next 24 hours you should not:  A) Drive an automobile B) Make any legal decisions C) Drink any alcoholic beverage   2) You may resume regular meals tomorrow.  Today it is better to start with liquids and gradually work up to solid foods.  You may eat anything you prefer, but it is better to start with liquids, then soup and crackers, and gradually work up to solid foods.   3) Please notify your doctor immediately if you have any unusual bleeding, trouble breathing, redness and pain at the surgery site, drainage, fever, or pain not relieved by medication. 4)   5) Your post-operative visit with Dr.                                     is: Date:                        Time:    Please call to schedule your post-operative visit.  6) Additional Instructions:     Diagnostic Laparoscopy, Care After Refer to this sheet in the next few weeks. These instructions provide you with information about caring for yourself after your procedure. Your health care provider may also give you more specific instructions. Your treatment has been planned according to current medical practices, but problems sometimes occur. Call your health care provider if you have any problems or questions after your procedure. What can I expect after the procedure? After your procedure, it is common to have mild discomfort in the throat and abdomen. Follow these instructions at home:  Take over-the-counter and prescription medicines only as told by your health care provider.  Do not drive for 24 hours if you received a sedative.  Return to your normal activities as told by your health care provider.  Do not take baths, swim, or use a hot tub until your health care provider approves. You may shower.  Follow instructions from your health care provider about how to take care of your  incision. Make sure you: ? Wash your hands with soap and water before you change your bandage (dressing). If soap and water are not available, use hand sanitizer. ? Change your dressing as told by your health care provider. ? Leave stitches (sutures), skin glue, or adhesive strips in place. These skin closures may need to stay in place for 2 weeks or longer. If adhesive strip edges start to loosen and curl up, you may trim the loose edges. Do not remove adhesive strips completely unless your health care provider tells you to do that.  Check your incision area every day for signs of infection. Check for: ? More redness, swelling, or pain. ? More fluid or blood. ? Warmth. ? Pus or a bad smell.  It is your responsibility to get the results of your procedure. Ask your health care provider or the department performing the procedure when your results will be ready. Contact a health care provider if:  There is new pain in your shoulders.  You feel light-headed or faint.  You are unable to pass gas or unable to have a bowel movement.  You feel nauseous or you vomit.  You develop a rash.  You have more redness, swelling, or pain around your incision.  You  have more fluid or blood coming from your incision.  Your incision feels warm to the touch.  You have pus or a bad smell coming from your incision.  You have a fever or chills. Get help right away if:  Your pain is getting worse.  You have ongoing vomiting.  The edges of your incision open up.  You have trouble breathing.  You have chest pain. This information is not intended to replace advice given to you by your health care provider. Make sure you discuss any questions you have with your health care provider. Document Released: 10/16/2015 Document Revised: 04/11/2016 Document Reviewed: 07/18/2015 Elsevier Interactive Patient Education  2018 [place] INSTRUCTIONS   7) The drugs that  you were given will stay in your system until tomorrow so for the next 24 hours you should not:  D) Drive an automobile E) Make any legal decisions F) Drink any alcoholic beverage   8) You may resume regular meals tomorrow.  Today it is better to start with liquids and gradually work up to solid foods.  You may eat anything you prefer, but it is better to start with liquids, then soup and crackers, and gradually work up to solid foods.   9) Please notify your doctor immediately if you have any unusual bleeding, trouble breathing, redness and pain at the surgery site, drainage, fever, or pain not relieved by medication.    10) Additional Instructions:        Please contact your physician with any problems or Same Day Surgery at [phone], Monday through Friday 6 am to 4 pm, or [place] at [facility] number at [phone].

## 2017-08-21 NOTE — Anesthesia Procedure Notes (Signed)
Procedure Name: Intubation Date/Time: 08/21/2017 4:08 PM Performed by: Dionne Bucy Pre-anesthesia Checklist: Patient identified, Patient being monitored, Timeout performed, Emergency Drugs available and Suction available Patient Re-evaluated:Patient Re-evaluated prior to induction Oxygen Delivery Method: Circle system utilized Preoxygenation: Pre-oxygenation with 100% oxygen Induction Type: IV induction Ventilation: Mask ventilation without difficulty Laryngoscope Size: Mac and 3 Grade View: Grade I Tube type: Oral Tube size: 7.0 mm Number of attempts: 1 Airway Equipment and Method: Stylet Placement Confirmation: ETT inserted through vocal cords under direct vision,  positive ETCO2 and breath sounds checked- equal and bilateral Secured at: 21 cm Tube secured with: Tape Dental Injury: Teeth and Oropharynx as per pre-operative assessment

## 2017-08-21 NOTE — Interval H&P Note (Signed)
History and Physical Interval Note:  08/21/2017 3:55 PM  Denise Bryan  has presented today for surgery, with the diagnosis of chronic pelvic pain  The various methods of treatment have been discussed with the patient and family. After consideration of risks, benefits and other options for treatment, the patient has consented to  Procedure(s): LAPAROSCOPY DIAGNOSTIC (N/A) as a surgical intervention .  The patient's history has been reviewed, patient examined, no change in status, stable for surgery.  I have reviewed the patient's chart and labs.  Questions were answered to the patient's satisfaction.     Prentice Docker, MD 08/21/2017 3:55 PM

## 2017-08-21 NOTE — Anesthesia Preprocedure Evaluation (Signed)
Anesthesia Evaluation  Patient identified by MRN, date of birth, ID band Patient awake    Reviewed: Allergy & Precautions, NPO status , Patient's Chart, lab work & pertinent test results  History of Anesthesia Complications Negative for: history of anesthetic complications  Airway Mallampati: I  TM Distance: >3 FB Neck ROM: Full    Dental no notable dental hx.    Pulmonary neg pulmonary ROS, neg sleep apnea, neg COPD,    breath sounds clear to auscultation- rhonchi (-) wheezing      Cardiovascular Exercise Tolerance: Good (-) hypertension(-) CAD and (-) Past MI  Rhythm:Regular Rate:Normal - Systolic murmurs and - Diastolic murmurs    Neuro/Psych negative neurological ROS  negative psych ROS   GI/Hepatic negative GI ROS, Neg liver ROS,   Endo/Other  negative endocrine ROSneg diabetes  Renal/GU negative Renal ROS     Musculoskeletal negative musculoskeletal ROS (+)   Abdominal (+) - obese,   Peds  Hematology negative hematology ROS (+)   Anesthesia Other Findings   Reproductive/Obstetrics                             Anesthesia Physical Anesthesia Plan  ASA: I  Anesthesia Plan: General   Post-op Pain Management:    Induction: Intravenous  PONV Risk Score and Plan: 2 and Ondansetron and Dexamethasone  Airway Management Planned: Oral ETT  Additional Equipment:   Intra-op Plan:   Post-operative Plan: Extubation in OR  Informed Consent: I have reviewed the patients History and Physical, chart, labs and discussed the procedure including the risks, benefits and alternatives for the proposed anesthesia with the patient or authorized representative who has indicated his/her understanding and acceptance.   Dental advisory given  Plan Discussed with: CRNA and Anesthesiologist  Anesthesia Plan Comments:         Anesthesia Quick Evaluation

## 2017-08-21 NOTE — Anesthesia Post-op Follow-up Note (Signed)
Anesthesia QCDR form completed.        

## 2017-08-21 NOTE — Transfer of Care (Signed)
Immediate Anesthesia Transfer of Care Note  Patient: Denise Bryan  Procedure(s) Performed: LAPAROSCOPY DIAGNOSTIC (N/A Abdomen)  Patient Location: PACU  Anesthesia Type:General  Level of Consciousness: sedated  Airway & Oxygen Therapy: Patient Spontanous Breathing and Patient connected to face mask oxygen  Post-op Assessment: Report given to RN and Post -op Vital signs reviewed and stable  Post vital signs: Reviewed and stable  Last Vitals:  Vitals:   08/21/17 1253 08/21/17 1746  BP: 100/65 101/64  Pulse: 66 84  Resp: 17 20  Temp: 36.8 C (!) 36.3 C  SpO2: 100% 100%    Last Pain:  Vitals:   08/21/17 1253  TempSrc: Oral  PainSc: 5          Complications: No apparent anesthesia complications

## 2017-08-21 NOTE — H&P (View-Only) (Signed)
Obstetrics & Gynecology Office Visit   Chief Complaint  Patient presents with  . Follow-up  Pelvic pain  History of Present Illness: 31 y.o. [id number] female who presents in follow up for endometriosis-related pelvic pain treatment.  She was started on norethindrone at her last visit about 6 weeks ago.  She states that she has had minimal relief from her pain.  She has no side effects from the medication. She continues to have irregular vaginal bleeding.  She would like to try something else, specifically surgery for diagnosis and possible therapy of pain.    Past Medical History:  Diagnosis Date  . Endometriosis   . Family history of ovarian cancer   . Genetic testing of female 09/2016   TC=8.8% PER [facility]  . Spontaneous abortion   . Unspecified blood type, rh negative     Past Surgical History:  Procedure Laterality Date  . DIAGNOSTIC LAPAROSCOPY  04/26/2010   IUD EXPULSION/PELVIC PAIN  . DILATION AND CURETTAGE OF UTERUS  08/10/10; 08/06/07   HYDATIIDIFORM MOLE/POC, POC  . gallstones  sept 18, 2007  . HYSTEROSCOPY  04/26/2010   IUD EXPULSION /PELVIC PAIN  . TUBAL LIGATION  01/2012   SDJ    Gynecologic History: Patient's last menstrual period was 06/17/2017.  Obstetric History: [id number]  Family History  Problem Relation Age of Onset  . Diabetes Mother   . Hypertension Mother   . Cancer Mother 61       UTERINE  . Cancer Sister 20       CX  . Diabetes Maternal Uncle   . Diabetes Maternal Grandmother   . Hypertension Maternal Grandmother   . Lung disease Maternal Grandfather   . Cancer Maternal Grandfather        LUNG  . Heart disease Paternal Grandmother   . Hypertension Paternal Grandmother     Social History   Social History  . Marital status: Single    Spouse name: N/A  . Number of children: 3  . Years of education: 12   Occupational History  . UNEMPLOYED    Social History Main Topics  . Smoking status: Never Smoker  . Smokeless tobacco: Never Used    . Alcohol use No  . Drug use: No  . Sexual activity: Not Currently    Birth control/ protection: Surgical   Other Topics Concern  . Not on file   Social History Narrative  . No narrative on file    Allergies  Allergen Reactions  . Fish-Derived Products Anaphylaxis  . Iodine   . Tramadol Hives    Prior to Admission medications   Medication Sig Start Date End Date Taking? Authorizing Provider  norethindrone (AYGESTIN) 5 MG tablet Take 1 tablet (5 mg total) by mouth daily. 07/02/17  Yes Will Bonnet, MD  prenatal vitamin w/FE, FA (PRENATAL 1 + 1) 27-1 MG TABS Take 1 tablet by mouth daily.      [provider]    Review of Systems  Constitutional: Negative for chills, diaphoresis, fever, malaise/fatigue and weight loss.  Respiratory: Negative for cough, shortness of breath and wheezing.   Cardiovascular: Negative for chest pain, orthopnea and leg swelling.  Gastrointestinal: Positive for abdominal pain (pelvic pain). Negative for blood in stool, constipation, diarrhea, heartburn, nausea and vomiting.  Genitourinary: Negative for dysuria, frequency, hematuria and urgency.  Musculoskeletal: Negative.   Skin: Negative for itching and rash.  Neurological: Negative.  Negative for weakness.  Psychiatric/Behavioral: Negative.      Physical Exam BP  114/70   Wt 126 lb (57.2 kg)   LMP 06/17/2017   BMI 21.63 kg/m  Patient's last menstrual period was 06/17/2017. Physical Exam  Constitutional: She appears well-developed and well-nourished. No distress.  HENT:  Head: Normocephalic and atraumatic.  Pulmonary/Chest: Effort normal.  Psychiatric: She has a normal mood and affect. Her behavior is normal. Judgment normal.    Assessment: 31 y.o. [id number] female with chronic pelvic pain and likely endometriosis. Plan: Problem List Items Addressed This Visit    Pelvic pain - Primary   Dyspareunia, female   Endometriosis     Long (about 20 minute) discussion held with  patient regarding treatment options. Discussed different types of medical therapies. We also discussed surgery for diagnosis and possible therapeutic benefit. The patient strongly desires a surgery for diagnosis and potential therapy. She understands that even in the case of endometriosis there may be minimal to no findings on laparoscopy. Will schedule surgery for soon.  Continue norethindrone for now.  20 minutes spent in face to face discussion with > 50% spent in counseling and management of her chronic pelvic pain and dyspareunia.   Prentice Docker, MD 08/13/2017 8:53 AM

## 2017-08-21 NOTE — Anesthesia Postprocedure Evaluation (Signed)
Anesthesia Post Note  Patient: Denise Bryan  Procedure(s) Performed: LAPAROSCOPY DIAGNOSTIC (N/A Abdomen)  Patient location during evaluation: PACU Anesthesia Type: General Level of consciousness: awake and alert Pain management: pain level controlled Vital Signs Assessment: post-procedure vital signs reviewed and stable Respiratory status: spontaneous breathing, nonlabored ventilation, respiratory function stable and patient connected to nasal cannula oxygen Cardiovascular status: blood pressure returned to baseline and stable Postop Assessment: no apparent nausea or vomiting Anesthetic complications: no     Last Vitals:  Vitals:   08/21/17 1831 08/21/17 1840  BP: 98/67 (!) 99/57  Pulse: 99 83  Resp: 17 14  Temp:    SpO2: 97% 97%    Last Pain:  Vitals:   08/21/17 1840  TempSrc:   PainSc: 6                  Precious Haws Piscitello

## 2017-08-22 ENCOUNTER — Encounter: Payer: Self-pay | Admitting: Obstetrics and Gynecology

## 2017-08-24 LAB — URINE CULTURE: Culture: >=100000 — AB

## 2017-08-25 ENCOUNTER — Telehealth: Payer: Self-pay

## 2017-08-25 ENCOUNTER — Other Ambulatory Visit: Payer: Self-pay | Admitting: Obstetrics and Gynecology

## 2017-08-25 ENCOUNTER — Ambulatory Visit (INDEPENDENT_AMBULATORY_CARE_PROVIDER_SITE_OTHER): Payer: Medicaid Other | Admitting: Obstetrics and Gynecology

## 2017-08-25 VITALS — BP 118/74 | Wt 128.0 lb

## 2017-08-25 DIAGNOSIS — N390 Urinary tract infection, site not specified: Secondary | ICD-10-CM

## 2017-08-25 DIAGNOSIS — R102 Pelvic and perineal pain: Secondary | ICD-10-CM

## 2017-08-25 MED ORDER — SULFAMETHOXAZOLE-TRIMETHOPRIM 800-160 MG PO TABS
1.0000 | ORAL_TABLET | Freq: Two times a day (BID) | ORAL | 0 refills | Status: AC
Start: 2017-08-25 — End: 2017-08-28

## 2017-08-25 NOTE — Telephone Encounter (Signed)
-----   Message from Will Bonnet, MD sent at 08/25/2017 10:11 AM EDT ----- Would you mind calling this patient and letting her know that she does still have a urinary tract infection and that I will treat it by calling her in a medication for this?  Thank you!

## 2017-08-25 NOTE — Telephone Encounter (Signed)
Tried to call pt, he phone not accepting calls. Please let me know if she calls

## 2017-08-25 NOTE — Telephone Encounter (Signed)
Pt called after hour nurse c/o pain jumping from one shoulder to the other up to her neck.  She had lap abd surg for endometriosis Thurs and was gold to call if anything changed.  Pain is a stabbing pain, 8-9 on pain scale, no neck stiffness, no fever. Was adv. To go to ED.  [phone]  [facility].

## 2017-08-25 NOTE — Progress Notes (Signed)
   Postoperative Follow-up Patient presents post op from diagnostic laparoscopy 4days ago for pelvic pain.  Subjective: Patient reports some improvement in her preop symptoms. Eating a regular diet without difficulty. The patient is not having any pain.  Activity: normal activities of daily living with a slow increase.  Mainly she presented with concern for her umbilical incision.  It has opened and has had some bleeding. She denies fevers, chills, nausea, emesis. She is tolerating a normal diet and has normal bowel and bladder function.   Objective: Vitals:   08/25/17 1053  BP: 118/74   Vital Signs: BP 118/74   Wt 128 lb (58.1 kg)   LMP 07/01/2017 (LMP Unknown)   BMI 21.97 kg/m  Constitutional: Well nourished, well developed female in no acute distress.  HEENT: normal Skin: Warm and dry.  Extremity: no edema  Abdomen: Soft, non-tender, normal bowel sounds; no bruits, organomegaly or masses. Incision: suprapubic is clean, dry, intact without erythema, induration, warmth, and tenderness. The umbilical incision is maybe slightly open on the left. Closed and intact on the right. No erythema, induration, warmth and tenderness on either side.  No bleeding noted.  Are prepped with betadine and surgical skin glue re-applied to left aspect of incision without difficulty.   Assessment: 31 y.o. s/p diagnostic laparoscopy progressing well without evidence of wound infectino  Plan: Patient has done well after surgery with no apparent complications.  I have discussed the post-operative course to date, and the expected progress moving forward.  The patient understands what complications to be concerned about.  I will see the patient in routine follow up, or sooner if needed.    Activity plan: increase slowly.  May return to work with restrictions of no lifting > 20 pounds until her follow up in 2 weeks.  UTI diagnosed prior to surgery. Patient given Rx to treat.   Prentice Docker 08/25/2017,  10:56 AM

## 2017-08-25 NOTE — Telephone Encounter (Signed)
This encounter was created in error - please disregard.

## 2017-08-27 ENCOUNTER — Encounter: Payer: Self-pay | Admitting: Obstetrics and Gynecology

## 2017-08-27 ENCOUNTER — Ambulatory Visit (INDEPENDENT_AMBULATORY_CARE_PROVIDER_SITE_OTHER): Payer: Medicaid Other | Admitting: Obstetrics and Gynecology

## 2017-08-27 VITALS — BP 102/58 | HR 72 | Wt 127.0 lb

## 2017-08-27 DIAGNOSIS — Z4889 Encounter for other specified surgical aftercare: Secondary | ICD-10-CM

## 2017-08-27 NOTE — Telephone Encounter (Signed)
Pt was seen 08/25/17 by SDJ.

## 2017-08-29 NOTE — Progress Notes (Signed)
      Postoperative Follow-up Patient presents post op from diagnostic laparoscopy on 08/21/2017 by Dr. Glennon Mac with normal findings for pelvic pain.  Subjective: Patient reports some improvement in her preop symptoms. She saw Dr. Glennon Mac yesterday two days ago for concerns of incisional infection and was worked in today for repeat concerns of infection. No bleeding, or discharge from trochar sites.  No fevers, no chills  Objective: Vitals:   08/27/17 1551  BP: (!) 102/58  Pulse: 72   Gen: NAD Abdomen: soft, non-tender, non-distended.  Mild ecchymosis at umbilical trocar stie.  The suprapubic site has some superficial skin separation.  Benzoin and steri strip applied.  Assessment: 31 y.o. s/p diagnostic laparoscopy stable  Plan: Patient has done well after surgery with no apparent complications.  I have discussed the post-operative course to date, and the expected progress moving forward.  The patient understands what complications to be concerned about.  I will see the patient in routine follow up, or sooner if needed.    Activity plan: No restriction. Reassured that incision are healing appropriately Malachy Mood 08/29/2017, 9:58 PM

## 2017-09-03 ENCOUNTER — Ambulatory Visit: Payer: Medicaid Other | Admitting: Obstetrics and Gynecology

## 2017-09-08 ENCOUNTER — Ambulatory Visit: Payer: Medicaid Other | Admitting: Obstetrics and Gynecology

## 2017-09-10 ENCOUNTER — Ambulatory Visit (INDEPENDENT_AMBULATORY_CARE_PROVIDER_SITE_OTHER): Payer: Medicaid Other | Admitting: Obstetrics and Gynecology

## 2017-09-10 ENCOUNTER — Encounter: Payer: Self-pay | Admitting: Obstetrics and Gynecology

## 2017-09-10 VITALS — BP 122/70 | Ht 64.0 in | Wt 126.0 lb

## 2017-09-10 DIAGNOSIS — R102 Pelvic and perineal pain: Secondary | ICD-10-CM

## 2017-09-10 DIAGNOSIS — N809 Endometriosis, unspecified: Secondary | ICD-10-CM

## 2017-09-10 DIAGNOSIS — N941 Unspecified dyspareunia: Secondary | ICD-10-CM

## 2017-09-10 MED ORDER — ELAGOLIX SODIUM 150 MG PO TABS: 1.0000 | ORAL_TABLET | Freq: Every day | ORAL | 5 refills | Status: AC

## 2017-09-10 NOTE — Progress Notes (Signed)
   Postoperative Follow-up Patient presents post op from diagnostic laparoscopy 3weeks ago for pelvic pain.  Subjective: Patient reports some improvement in her preop symptoms. Eating a regular diet without difficulty. The patient is not having any pain.  Activity: normal activities of daily living.  Objective: Vitals:   09/10/17 1124  BP: 122/70   Vital Signs: BP 122/70   Ht 5\' 4"  (1.626 m)   Wt 126 lb (57.2 kg)   BMI 21.63 kg/m  Constitutional: Well nourished, well developed female in no acute distress.  HEENT: normal Skin: Warm and dry.  Extremity: no edema  Abdomen: Soft, non-tender, normal bowel sounds; no bruits, organomegaly or masses. clean, dry and intact  Assessment: 31 y.o. s/p diagnostic laparoscopy progressing well  Plan: Patient has done well after surgery with no apparent complications.  I have discussed the post-operative course to date, and the expected progress moving forward.  The patient understands what complications to be concerned about.  I will see the patient in routine follow up, or sooner if needed.    Activity plan: 25 pound weight lifting for two more weeks, then may resume lifting without restrictions.   Prentice Docker 09/10/2017, 12:02 PM

## 2017-10-13 ENCOUNTER — Ambulatory Visit: Payer: Medicaid Other | Admitting: Obstetrics and Gynecology

## 2017-10-14 ENCOUNTER — Ambulatory Visit: Payer: Medicaid Other | Admitting: Obstetrics and Gynecology

## 2018-01-27 ENCOUNTER — Emergency Department
Admission: EM | Admit: 2018-01-27 | Discharge: 2018-01-27 | Disposition: A | Discharge status: Home / Self Care | Payer: Medicaid Other | Attending: Emergency Medicine | Admitting: Emergency Medicine

## 2018-01-27 ENCOUNTER — Encounter: Payer: Self-pay | Admitting: Emergency Medicine

## 2018-01-27 ENCOUNTER — Other Ambulatory Visit: Payer: Self-pay

## 2018-01-27 DIAGNOSIS — R5381 Other malaise: Secondary | ICD-10-CM | POA: Diagnosis not present

## 2018-01-27 DIAGNOSIS — M791 Myalgia, unspecified site: Secondary | ICD-10-CM | POA: Insufficient documentation

## 2018-01-27 DIAGNOSIS — Z79899 Other long term (current) drug therapy: Secondary | ICD-10-CM | POA: Diagnosis not present

## 2018-01-27 DIAGNOSIS — R1110 Vomiting, unspecified: Secondary | ICD-10-CM | POA: Diagnosis not present

## 2018-01-27 DIAGNOSIS — R509 Fever, unspecified: Secondary | ICD-10-CM | POA: Diagnosis present

## 2018-01-27 DIAGNOSIS — J111 Influenza due to unidentified influenza virus with other respiratory manifestations: Secondary | ICD-10-CM | POA: Insufficient documentation

## 2018-01-27 DIAGNOSIS — R51 Headache: Secondary | ICD-10-CM | POA: Diagnosis not present

## 2018-01-27 DIAGNOSIS — R69 Illness, unspecified: Secondary | ICD-10-CM

## 2018-01-27 MED ORDER — OSELTAMIVIR PHOSPHATE 75 MG PO CAPS: 75.0000 mg | ORAL_CAPSULE | Freq: Two times a day (BID) | ORAL | 0 refills | Status: AC

## 2018-01-27 MED ORDER — KETOROLAC TROMETHAMINE 30 MG/ML IJ SOLN
30.0000 mg | Freq: Once | INTRAMUSCULAR | Status: AC
  Administered 2018-01-27: 30 mg via INTRAMUSCULAR
  Filled 2018-01-27: qty 1

## 2018-01-27 NOTE — ED Provider Notes (Signed)
[facility] Emergency Department Provider Note  ____________________________________________  Time seen: Approximately 7:38 PM  I have reviewed the triage vital signs and the nursing notes.   HISTORY  Chief Complaint Fever; Emesis; and Headache    HPI Denise Bryan is a 32 y.o. female presents to the emergency department with fever, headache, and vomiting that started this morning.  Patient denies possibility of pregnancy.  Patient also reports malaise and myalgias.  Patient works at [employer] and has numerous sick contacts.  She is tolerating fluids and her own secretions.  Patient is requesting a work note.   Past Medical History:  Diagnosis Date  . Endometriosis   . Family history of ovarian cancer   . Genetic testing of female 09/2016   TC=8.8% PER [facility]AD  . Spontaneous abortion   . Unspecified blood type, rh negative     Patient Active Problem List   Diagnosis Date Noted  . Urinary tract infection 08/21/2017  . Endometriosis 07/02/2017  . Pelvic pain 06/23/2017  . Dyspareunia, female 06/23/2017  . Menorrhagia with irregular cycle 06/23/2017    Past Surgical History:  Procedure Laterality Date  . DIAGNOSTIC LAPAROSCOPY  04/26/2010   IUD EXPULSION/PELVIC PAIN  . DILATION AND CURETTAGE OF UTERUS  08/10/10; 08/06/07   HYDATIIDIFORM MOLE/POC, POC  . gallstones  sept 18, 2007  . HYSTEROSCOPY  04/26/2010   IUD EXPULSION /PELVIC PAIN  . LAPAROSCOPY N/A 08/21/2017   Procedure: LAPAROSCOPY DIAGNOSTIC;  Surgeon: Will Bonnet, MD;  Location: [facility] ORS;  Service: Gynecology;  Laterality: N/A;  . TUBAL LIGATION  01/2012   SDJ    Prior to Admission medications   Medication Sig Start Date End Date Taking? Authorizing Provider  acetaminophen (TYLENOL) 500 MG tablet Take 1,000 mg by mouth every 6 (six) hours as needed for mild pain or headache.    [provider]  HYDROcodone-acetaminophen (NORCO) 5-325 MG tablet  Take 1 tablet by mouth every 6 (six) hours as needed for moderate pain. 08/21/17   Will Bonnet, MD  ibuprofen (ADVIL,MOTRIN) 200 MG tablet Take 4 tablets (800 mg total) by mouth every 8 (eight) hours as needed for headache or mild pain. 08/21/17   Will Bonnet, MD  norethindrone (AYGESTIN) 5 MG tablet Take 1 tablet (5 mg total) by mouth daily. 07/02/17   Will Bonnet, MD  oseltamivir (TAMIFLU) 75 MG capsule Take 1 capsule (75 mg total) by mouth 2 (two) times daily for 5 days. 01/27/18 02/01/18  Lannie Fields, PA-C    Allergies Fish-derived products; Iodine; and Tramadol  Family History  Problem Relation Age of Onset  . Diabetes Mother   . Hypertension Mother   . Cancer Mother 52       UTERINE  . Cancer Sister 20       CX  . Diabetes Maternal Uncle   . Diabetes Maternal Grandmother   . Hypertension Maternal Grandmother   . Lung disease Maternal Grandfather   . Cancer Maternal Grandfather        LUNG  . Heart disease Paternal Grandmother   . Hypertension Paternal Grandmother     Social History Social History   Tobacco Use  . Smoking status: Never Smoker  . Smokeless tobacco: Never Used  Substance Use Topics  . Alcohol use: No  . Drug use: No     Review of Systems  Constitutional: Patient has fever and chills.  Eyes: No visual changes. No discharge ENT: No upper  respiratory complaints. Cardiovascular: no chest pain. Respiratory: no cough. No SOB. Gastrointestinal: No abdominal pain. Patient has vomiting.  No diarrhea.  No constipation. Genitourinary: Negative for dysuria. No hematuria Musculoskeletal: Negative for musculoskeletal pain. Skin: Negative for rash, abrasions, lacerations, ecchymosis. Neurological: Negative for headaches, focal weakness or numbness.   ____________________________________________   PHYSICAL EXAM:  VITAL SIGNS: ED Triage Vitals  Enc Vitals Group     BP 01/27/18 1808 119/62     Pulse Rate 01/27/18 1808 68     Resp  01/27/18 1808 18     Temp 01/27/18 1808 98 F (36.7 C)     Temp Source 01/27/18 1808 Oral     SpO2 01/27/18 1808 99 %     Weight 01/27/18 1808 132 lb 15 oz (60.3 kg)     Height 01/27/18 1808 '5\' 4"'  (1.626 m)     Head Circumference --      Peak Flow --      Pain Score 01/27/18 1821 10     Pain Loc --      Pain Edu? --      Excl. in [place]? --     Constitutional: Alert and oriented. Patient is lying supine. Eyes: Conjunctivae are normal. PERRL. EOMI. Head: Atraumatic. ENT:      Ears: Tympanic membranes are mildly injected with mild effusion bilaterally.       Nose: No congestion/rhinnorhea.      Mouth/Throat: Mucous membranes are moist. Posterior pharynx is mildly erythematous.  Hematological/Lymphatic/Immunilogical: No cervical lymphadenopathy.  Cardiovascular: Normal rate, regular rhythm. Normal S1 and S2.  Good peripheral circulation. Respiratory: Normal respiratory effort without tachypnea or retractions. Lungs CTAB. Good air entry to the bases with no decreased or absent breath sounds. Gastrointestinal: Bowel sounds 4 quadrants. Soft and nontender to palpation. No guarding or rigidity. No palpable masses. No distention. No CVA tenderness. Musculoskeletal: Full range of motion to all extremities. No gross deformities appreciated. Neurologic:  Normal speech and language. No gross focal neurologic deficits are appreciated.  Skin:  Skin is warm, dry and intact. No rash noted. Psychiatric: Mood and affect are normal. Speech and behavior are normal. Patient exhibits appropriate insight and judgement.   ____________________________________________   LABS (all labs ordered are listed, but only abnormal results are displayed)  Labs Reviewed - No data to display ____________________________________________  EKG   ____________________________________________  RADIOLOGY  No results found.  ____________________________________________    PROCEDURES  Procedure(s) performed:     Procedures    Medications - No data to display   ____________________________________________   INITIAL IMPRESSION / ASSESSMENT AND PLAN / ED COURSE  Pertinent labs & imaging results that were available during my care of the patient were reviewed by me and considered in my medical decision making (see chart for details).  Review of the [place] CSRS was performed in accordance of the [place] prior to dispensing any controlled drugs.    Assessment and plan Influenza-like illness Patient presents to the emergency department with headache, self-reported fever, malaise and vomiting that started today.  Patient declined influenza testing in the emergency department and opted to be treated empirically with Tamiflu.  Work notes were provided.  Rest and hydration were encouraged.  All patient questions were answered.   ____________________________________________  FINAL CLINICAL IMPRESSION(S) / ED DIAGNOSES  Final diagnoses:  Influenza-like illness      NEW MEDICATIONS STARTED DURING THIS VISIT:  ED Discharge Orders        Ordered    oseltamivir (TAMIFLU) 75 MG  capsule  2 times daily     01/27/18 1934          This chart was dictated using voice recognition software/Dragon. Despite best efforts to proofread, errors can occur which can change the meaning. Any change was purely unintentional.    Lannie Fields, PA-C 01/27/18 1940    Arta Silence, MD 01/27/18 2008

## 2018-01-27 NOTE — ED Triage Notes (Signed)
Pt with vomiting x 6, fever tmax 102.0, and headache that started this am. Pt does have history of HA but says that this one does not feel the same as usual.

## 2018-01-27 NOTE — ED Notes (Addendum)
Pt in reporting headache, vomiting and weakness starting 0300-0400 01/27/2018. No known sick contacts but works in [employer]. Pt reporting 10/10 throbbing headache that she took ibuprofen for at home and got no relief.

## 2018-05-14 ENCOUNTER — Emergency Department
Admission: EM | Admit: 2018-05-14 | Discharge: 2018-05-14 | Disposition: A | Discharge status: Home / Self Care | Payer: Medicaid Other | Attending: Emergency Medicine | Admitting: Emergency Medicine

## 2018-05-14 ENCOUNTER — Emergency Department: Payer: Medicaid Other

## 2018-05-14 ENCOUNTER — Encounter: Payer: Self-pay | Admitting: Emergency Medicine

## 2018-05-14 DIAGNOSIS — M654 Radial styloid tenosynovitis [de Quervain]: Secondary | ICD-10-CM | POA: Insufficient documentation

## 2018-05-14 DIAGNOSIS — Z79899 Other long term (current) drug therapy: Secondary | ICD-10-CM | POA: Insufficient documentation

## 2018-05-14 MED ORDER — MELOXICAM 15 MG PO TABS: 15.0000 mg | ORAL_TABLET | Freq: Every day | ORAL | 1 refills | Status: AC

## 2018-05-14 NOTE — ED Triage Notes (Signed)
Pt reports pain to right wrist for the last couple of days. Denies injuries.

## 2018-05-14 NOTE — ED Provider Notes (Signed)
[facility] Emergency Department Provider Note  ____________________________________________  Time seen: Approximately 3:37 PM  I have reviewed the triage vital signs and the nursing notes.   HISTORY  Chief Complaint Wrist Pain    HPI Denise Bryan is a 32 y.o. female presents to the emergency department with 6/10 aching pain along the first dorsal extensor compartment of the right wrist after doing repetitive activities at work for the past week.  Patient reports that she routinely does chopping.  She denies weakness, radiculopathy or changes in sensation of the upper extremities.  No alleviating measures of been attempted.   Past Medical History:  Diagnosis Date  . Endometriosis   . Family history of ovarian cancer   . Genetic testing of female 09/2016   TC=8.8% PER [facility]  . Spontaneous abortion   . Unspecified blood type, rh negative     Patient Active Problem List   Diagnosis Date Noted  . Urinary tract infection 08/21/2017  . Endometriosis 07/02/2017  . Pelvic pain 06/23/2017  . Dyspareunia, female 06/23/2017  . Menorrhagia with irregular cycle 06/23/2017    Past Surgical History:  Procedure Laterality Date  . DIAGNOSTIC LAPAROSCOPY  04/26/2010   IUD EXPULSION/PELVIC PAIN  . DILATION AND CURETTAGE OF UTERUS  08/10/10; 08/06/07   HYDATIIDIFORM MOLE/POC, POC  . gallstones  sept 18, 2007  . HYSTEROSCOPY  04/26/2010   IUD EXPULSION /PELVIC PAIN  . LAPAROSCOPY N/A 08/21/2017   Procedure: LAPAROSCOPY DIAGNOSTIC;  Surgeon: Will Bonnet, MD;  Location: [facility] ORS;  Service: Gynecology;  Laterality: N/A;  . TUBAL LIGATION  01/2012   SDJ    Prior to Admission medications   Medication Sig Start Date End Date Taking? Authorizing Provider  acetaminophen (TYLENOL) 500 MG tablet Take 1,000 mg by mouth every 6 (six) hours as needed for mild pain or headache.    [provider]  HYDROcodone-acetaminophen (NORCO) 5-325 MG tablet Take  1 tablet by mouth every 6 (six) hours as needed for moderate pain. 08/21/17   Will Bonnet, MD  ibuprofen (ADVIL,MOTRIN) 200 MG tablet Take 4 tablets (800 mg total) by mouth every 8 (eight) hours as needed for headache or mild pain. 08/21/17   Will Bonnet, MD  meloxicam (MOBIC) 15 MG tablet Take 1 tablet (15 mg total) by mouth daily for 7 days. 05/14/18 05/21/18  Lannie Fields, PA-C  norethindrone (AYGESTIN) 5 MG tablet Take 1 tablet (5 mg total) by mouth daily. 07/02/17   Will Bonnet, MD    Allergies Fish-derived products; Iodine; and Tramadol  Family History  Problem Relation Age of Onset  . Diabetes Mother   . Hypertension Mother   . Cancer Mother 67       UTERINE  . Cancer Sister 20       CX  . Diabetes Maternal Uncle   . Diabetes Maternal Grandmother   . Hypertension Maternal Grandmother   . Lung disease Maternal Grandfather   . Cancer Maternal Grandfather        LUNG  . Heart disease Paternal Grandmother   . Hypertension Paternal Grandmother     Social History Social History   Tobacco Use  . Smoking status: Never Smoker  . Smokeless tobacco: Never Used  Substance Use Topics  . Alcohol use: No  . Drug use: No     Review of Systems  Constitutional: No fever/chills Eyes: No visual changes. No discharge ENT: No upper respiratory complaints. Cardiovascular: no chest pain. Respiratory: no cough.  No SOB. Gastrointestinal: No abdominal pain.  No nausea, no vomiting.  No diarrhea.  No constipation. Musculoskeletal: Patient has right wrist pain.  Skin: Negative for rash, abrasions, lacerations, ecchymosis. Neurological: Negative for headaches, focal weakness or numbness.   ____________________________________________   PHYSICAL EXAM:  VITAL SIGNS: ED Triage Vitals  Enc Vitals Group     BP 05/14/18 1445 100/70     Pulse Rate 05/14/18 1445 99     Resp 05/14/18 1445 16     Temp 05/14/18 1445 97.8 F (36.6 C)     Temp Source 05/14/18 1445 Oral      SpO2 05/14/18 1445 98 %     Weight 05/14/18 1443 125 lb (56.7 kg)     Height 05/14/18 1443 _0  (1.626 m)     Head Circumference --      Peak Flow --      Pain Score 05/14/18 1443 10     Pain Loc --      Pain Edu? --      Excl. in [place]? --      Constitutional: Alert and oriented. Well appearing and in no acute distress. Eyes: Conjunctivae are normal. PERRL. EOMI. Head: Atraumatic. Cardiovascular: Normal rate, regular rhythm. Normal S1 and S2.  Good peripheral circulation. Respiratory: Normal respiratory effort without tachypnea or retractions. Lungs CTAB. Good air entry to the bases with no decreased or absent breath sounds. Musculoskeletal: No edema of the right wrist.  Patient demonstrates full range of motion.  Positive Finkelstein test, right.  Negative Tinel and Phalen's test.  Palpable radial pulse, right. Neurologic:  Normal speech and language. No gross focal neurologic deficits are appreciated.  Skin:  Skin is warm, dry and intact. No rash noted. Psychiatric: Mood and affect are normal. Speech and behavior are normal. Patient exhibits appropriate insight and judgement.   ____________________________________________   LABS (all labs ordered are listed, but only abnormal results are displayed)  Labs Reviewed - No data to display ____________________________________________  EKG   ____________________________________________  RADIOLOGY I personally viewed and evaluated these images as part of my medical decision making, as well as reviewing the written report by the radiologist.  Dg Wrist Complete Right  Result Date: 05/14/2018 CLINICAL DATA:  Right wrist pain radiating up forearm x4 days with no reported injury. Pt reports previous right wrist fracture "many years ago." EXAM: RIGHT WRIST - COMPLETE 3+ VIEW COMPARISON:  None. FINDINGS: There is no evidence of fracture or dislocation. There is no evidence of arthropathy or other focal bone abnormality. Soft tissues  are unremarkable. IMPRESSION: Negative. Electronically Signed   By: Lajean Manes M.D.   On: 05/14/2018 15:25    ____________________________________________    PROCEDURES  Procedure(s) performed:    Procedures    Medications - No data to display   ____________________________________________   INITIAL IMPRESSION / ASSESSMENT AND PLAN / ED COURSE  Pertinent labs & imaging results that were available during my care of the patient were reviewed by me and considered in my medical decision making (see chart for details).  Review of the [place] CSRS was performed in accordance of the [place] prior to dispensing any controlled drugs.      Assessment and plan De Quervain's tenosynovitis Patient presents to the emergency department with right wrist pain along the first dorsal extensor compartment exacerbated by repetitive activities at work.  Positive Finkelstein test on physical exam increases suspicion for de Quervain's.  Patient was given a wrist splint in the emergency department and discharged  with meloxicam.  She was referred to orthopedics.  Vital signs are reassuring prior to discharge.  All patient questions were answered.    ____________________________________________  FINAL CLINICAL IMPRESSION(S) / ED DIAGNOSES  Final diagnoses:  De Quervain's tenosynovitis      NEW MEDICATIONS STARTED DURING THIS VISIT:  ED Discharge Orders        Ordered    meloxicam (MOBIC) 15 MG tablet  Daily     05/14/18 1535          This chart was dictated using voice recognition software/Dragon. Despite best efforts to proofread, errors can occur which can change the meaning. Any change was purely unintentional.    Lannie Fields, PA-C 05/14/18 [place], [place], MD 05/16/18 1538

## 2018-05-14 NOTE — ED Notes (Signed)
See triage note  Present with pain to right wrist  States he felt a "pop" to wrist about 1-2 weeks ago  States she has gotten the swelling to go down  But conts' to have pain   The pain is moving up arm  Good pulses

## 2018-07-13 ENCOUNTER — Ambulatory Visit (INDEPENDENT_AMBULATORY_CARE_PROVIDER_SITE_OTHER): Payer: Medicaid Other | Admitting: Obstetrics and Gynecology

## 2018-07-13 ENCOUNTER — Other Ambulatory Visit (HOSPITAL_COMMUNITY)
Admission: RE | Admit: 2018-07-13 | Discharge: 2018-07-13 | Disposition: A | Discharge status: Home / Self Care | Payer: Medicaid Other | Source: Ambulatory Visit | Attending: Obstetrics and Gynecology | Admitting: Obstetrics and Gynecology

## 2018-07-13 VITALS — BP 110/60 | HR 92 | Ht 64.0 in | Wt 133.0 lb

## 2018-07-13 DIAGNOSIS — Z1151 Encounter for screening for human papillomavirus (HPV): Secondary | ICD-10-CM | POA: Diagnosis not present

## 2018-07-13 DIAGNOSIS — Z113 Encounter for screening for infections with a predominantly sexual mode of transmission: Secondary | ICD-10-CM | POA: Diagnosis not present

## 2018-07-13 DIAGNOSIS — Z124 Encounter for screening for malignant neoplasm of cervix: Secondary | ICD-10-CM | POA: Diagnosis present

## 2018-07-13 DIAGNOSIS — Z01419 Encounter for gynecological examination (general) (routine) without abnormal findings: Secondary | ICD-10-CM

## 2018-07-13 DIAGNOSIS — N809 Endometriosis, unspecified: Secondary | ICD-10-CM

## 2018-07-13 DIAGNOSIS — Z1331 Encounter for screening for depression: Secondary | ICD-10-CM

## 2018-07-13 DIAGNOSIS — Z1339 Encounter for screening examination for other mental health and behavioral disorders: Secondary | ICD-10-CM

## 2018-07-13 DIAGNOSIS — R102 Pelvic and perineal pain: Secondary | ICD-10-CM

## 2018-07-13 DIAGNOSIS — R87619 Unspecified abnormal cytological findings in specimens from cervix uteri: Secondary | ICD-10-CM | POA: Insufficient documentation

## 2018-07-13 DIAGNOSIS — Z309 Encounter for contraceptive management, unspecified: Secondary | ICD-10-CM

## 2018-07-13 NOTE — Progress Notes (Signed)
Gynecology Annual Exam   PCP: [facility] [place]  Chief Complaint  Patient presents with  . Annual Exam   History of Present Illness:  Ms. Denise Bryan is a 32 y.o. [phone] who LMP was Patient's last menstrual period was 06/18/2018., presents today for her annual examination.  Her menses are twice monthly, lasting  Between 3 days to 2 weeks.  Dysmenorrhea severe, occurring throughout cycle.   She is single partner, contraception - none.  Last Pap: 06/2017  Results were: atypical squamous cellularity of undetermined significance (ASCUS) /neg HPV DNA negative Hx of STDs: none  Last mammogram: n/a There is not a FH of breast cancer. There is a FH of ovarian cancer.  She is MyRisk negative.   Tobacco use: The patient denies current or previous tobacco use. Alcohol use: none Exercise: very active  The patient wears seatbelts: yes.      She continues to have pain everyday. It is located in her suprapubic and right and left lower quadrant. The pain comes and goes and described as sharp.  Most of the time the pain is 9/10.  The pain does not radiate. Nothing alleviates the pain. She tries sitting down and using a heating pad.  Sitting still makes it worse.  No associated symptoms.  Denies dysuria.  She has constipation and has to take medication to force a bowel movement.  She believes a lot of her problems have occurred since her BTL.  Her pain was fine after her diagnostic laparoscopy until February-March.  She had none of this pain until after her tubal ligation, which was in 2013.   Past Medical History:  Diagnosis Date  . Endometriosis   . Family history of ovarian cancer   . Gastroesophageal reflux disease 09/20/2008  . Genetic testing of female 09/2016   TC=8.8% PER [facility]  . Genital herpes simplex 01/02/2015  . Spontaneous abortion   . Unspecified blood type, rh negative     Past Surgical History:  Procedure Laterality Date  . DIAGNOSTIC LAPAROSCOPY   04/26/2010   IUD EXPULSION/PELVIC PAIN  . DILATION AND CURETTAGE OF UTERUS  08/10/10; 08/06/07   HYDATIIDIFORM MOLE/POC, POC  . gallstones  sept 18, 2007  . HYSTEROSCOPY  04/26/2010   IUD EXPULSION /PELVIC PAIN  . LAPAROSCOPY N/A 08/21/2017   Procedure: LAPAROSCOPY DIAGNOSTIC;  Surgeon: Will Bonnet, MD;  Location: [facility] ORS;  Service: Gynecology;  Laterality: N/A;  . TUBAL LIGATION  01/2012   SDJ    Medications: denies   Allergies  Allergen Reactions  . Eicosapentaenoic Acid (Epa) Anaphylaxis  . Fish-Derived Products Anaphylaxis  . Iodine Anaphylaxis  . Tramadol Hives   Gynecologic History: Patient's last menstrual period was 06/18/2018.  Obstetric History: [id number]  Social History   Socioeconomic History  . Marital status: Single    Spouse name: Not on file  . Number of children: 3  . Years of education: 77  . Highest education level: Not on file  Occupational History  . Occupation: UNEMPLOYED  Social Needs  . Financial resource strain: Not on file  . Food insecurity:    Worry: Not on file    Inability: Not on file  . Transportation needs:    Medical: Not on file    Non-medical: Not on file  Tobacco Use  . Smoking status: Never Smoker  . Smokeless tobacco: Never Used  Substance and Sexual Activity  . Alcohol use: No  . Drug use: No  . Sexual activity: Not  Currently    Birth control/protection: Surgical  Lifestyle  . Physical activity:    Days per week: Not on file    Minutes per session: Not on file  . Stress: Not on file  Relationships  . Social connections:    Talks on phone: Not on file    Gets together: Not on file    Attends religious service: Not on file    Active member of club or organization: Not on file    Attends meetings of clubs or organizations: Not on file    Relationship status: Not on file  . Intimate partner violence:    Fear of current or ex partner: Not on file    Emotionally abused: Not on file    Physically abused: Not on  file    Forced sexual activity: Not on file  Other Topics Concern  . Not on file  Social History Narrative  . Not on file    Family History  Problem Relation Age of Onset  . Diabetes Mother   . Hypertension Mother   . Cancer Mother 51       UTERINE  . Colon cancer Mother   . Cancer Sister 20       CX  . Diabetes Maternal Uncle   . Diabetes Maternal Grandmother   . Hypertension Maternal Grandmother   . Lung disease Maternal Grandfather   . Cancer Maternal Grandfather        LUNG  . Heart disease Paternal Grandmother   . Hypertension Paternal Grandmother     Review of Systems  Constitutional: Negative.   HENT: Negative.   Eyes: Negative.   Respiratory: Negative.   Cardiovascular: Negative.   Gastrointestinal: Negative.   Genitourinary: Negative.   Musculoskeletal: Negative.   Skin: Negative.   Neurological: Negative.   Psychiatric/Behavioral: Negative.      Physical Exam BP 110/60 (BP Location: Left Arm)   Pulse 92   Ht '5\' 4"'  (1.626 m)   Wt 133 lb (60.3 kg)   LMP 06/18/2018   SpO2 97%   BMI 22.83 kg/m    Physical Exam  Constitutional: She is oriented to person, place, and time. She appears well-developed and well-nourished. No distress.  Genitourinary: Uterus normal. Pelvic exam was performed with patient supine. There is no rash, tenderness, lesion or injury on the right labia. There is no rash, tenderness, lesion or injury on the left labia. No erythema, tenderness or bleeding in the vagina. No signs of injury around the vagina. No vaginal discharge found.  Right adnexum displays tenderness (mild). Right adnexum does not display mass and does not display fullness.  Left adnexum displays tenderness (mild). Left adnexum does not display mass and does not display fullness. Cervix does not exhibit motion tenderness, lesion, discharge or polyp.   Uterus is mobile and anteverted. Uterus is not enlarged, tender or exhibiting a mass.  HENT:  Head: Normocephalic and  atraumatic.  Eyes: EOM are normal. No scleral icterus.  Neck: Normal range of motion. Neck supple. No thyromegaly present.  Cardiovascular: Normal rate and regular rhythm. Exam reveals no gallop and no friction rub.  No murmur heard. Pulmonary/Chest: Effort normal and breath sounds normal. No respiratory distress. She has no wheezes. She has no rales. Right breast exhibits no inverted nipple, no mass, no nipple discharge, no skin change and no tenderness. Left breast exhibits no inverted nipple, no mass, no nipple discharge, no skin change and no tenderness.  Abdominal: Soft. Bowel sounds are normal. She exhibits  no distension and no mass. There is no tenderness. There is no rebound and no guarding.  Musculoskeletal: Normal range of motion. She exhibits no edema or tenderness.  Lymphadenopathy:    She has no cervical adenopathy.       Right: No inguinal adenopathy present.       Left: No inguinal adenopathy present.  Neurological: She is alert and oriented to person, place, and time. No cranial nerve deficit.  Skin: Skin is warm and dry. No rash noted. No erythema.  Psychiatric: She has a normal mood and affect. Her behavior is normal. Judgment normal.   Female chaperone present for pelvic and breast  portions of the physical exam  Results: AUDIT Questionnaire (screen for alcoholism): 0 PHQ-9: 3  Assessment: 32 y.o. [id number] female here for routine annual gynecologic examination and chronic pelvic pain.   Plan: Problem List Items Addressed This Visit      Other   Pelvic pain   Relevant Orders   Urine Culture   Endometriosis    Other Visit Diagnoses    Women's annual routine gynecological examination    -  Primary   Relevant Orders   Urine Culture   Cytology - PAP   Screening for depression       Screen for STD (sexually transmitted disease)       Relevant Orders   Urine Culture   Cytology - PAP   Screening for alcoholism       Pap smear for cervical cancer screening        Relevant Orders   Urine Culture   Cytology - PAP      Screening: -- Blood pressure screen normal -- Colonoscopy - not due -- Mammogram - not due -- Weight screening: normal -- Depression screening negative (PHQ-9) -- Nutrition: normal -- cholesterol screening: not due for screening -- osteoporosis screening: not due -- tobacco screening: not using -- alcohol screening: AUDIT questionnaire indicates low-risk usage. -- family history of breast cancer screening: done. not at high risk. -- no evidence of domestic violence or intimate partner violence. -- STD screening: gonorrhea/chlamydia NAAT collected -- pap smear collected per ASCCP guidelines -- flu vaccine encouraged patient to obtain this season  Chronic pelvic pain: discussed treatment options. Most are not acceptable to her, as she has tried many of them.  Discussed excisional surgery. I discussed that I am not an expert in excisional surgery. Discussed [facility] agonist/antagonist medications along with the risks and potential short-term benefits. After a long discussion, the patient prefer surgery. We discussed hysterectomy, but I could not guarantee her pain relief, though I think she has a good chance of obtaining it.  She would prefer a diagnostic laparoscopy. If she gets no benefit, then she would like to proceed with hysterectomy. Will schedule.  20 minutes spent in face to face discussion with > 50% spent in counseling,management, and coordination of care of her chronic pelvic pain and endometriosis.   Prentice Docker, MD 07/14/2018 12:57 PM

## 2018-07-14 ENCOUNTER — Encounter: Payer: Self-pay | Admitting: Obstetrics and Gynecology

## 2018-07-15 LAB — URINE CULTURE

## 2018-07-16 LAB — CYTOLOGY - PAP
Chlamydia: NEGATIVE
HPV: NOT DETECTED
Neisseria Gonorrhea: NEGATIVE

## 2018-07-17 ENCOUNTER — Telehealth: Payer: Self-pay | Admitting: Obstetrics and Gynecology

## 2018-07-17 NOTE — Telephone Encounter (Signed)
Patient is aware of H&P at [facility] on 07/23/18 @ 8:10am w/ Dr. Glennon Mac, Pre-admit Testing afterwards, and OR on 07/30/18. Patient is aware she may receive calls from the [facility] and [facility]. Patient said her Medicaid FPW was changed to Regular Medicaid recently. Epic has e-verified FPW until 07/18/18. I will check Medicaid again on Tuesday, 07/21/18, and let patient know if it is still showing FPW. Ext given.

## 2018-07-21 ENCOUNTER — Encounter: Payer: Self-pay | Admitting: Obstetrics and Gynecology

## 2018-07-21 ENCOUNTER — Telehealth: Payer: Self-pay | Admitting: Obstetrics and Gynecology

## 2018-07-21 NOTE — Telephone Encounter (Signed)
Medicaid FPW is active for the month of September. Lmtrc.

## 2018-07-21 NOTE — Telephone Encounter (Signed)
Discussed abnormal pap smear and need for colposcopy.  Discussed risk of developing cancer of the cervix, if preventative steps aren't taken. She voiced understanding and agreement to schedule. If she is unable to fully establish MCD (not just family planning) will refer through [facility] program.

## 2018-07-21 NOTE — Telephone Encounter (Signed)
Patient l/m that she spoke to her caseworker, who said the name of the medicaid was not changed but that has been corrected, and that her medicaid is active, but should take 24-48 hrs to show as active in the system.

## 2018-07-23 ENCOUNTER — Encounter: Payer: Self-pay | Admitting: Obstetrics and Gynecology

## 2018-07-23 ENCOUNTER — Ambulatory Visit (INDEPENDENT_AMBULATORY_CARE_PROVIDER_SITE_OTHER): Payer: Medicaid Other | Admitting: Obstetrics and Gynecology

## 2018-07-23 VITALS — BP 114/70 | Ht 64.0 in | Wt 134.0 lb

## 2018-07-23 DIAGNOSIS — N809 Endometriosis, unspecified: Secondary | ICD-10-CM

## 2018-07-23 DIAGNOSIS — R102 Pelvic and perineal pain: Secondary | ICD-10-CM | POA: Diagnosis not present

## 2018-07-23 DIAGNOSIS — N941 Unspecified dyspareunia: Secondary | ICD-10-CM | POA: Diagnosis not present

## 2018-07-23 DIAGNOSIS — N921 Excessive and frequent menstruation with irregular cycle: Secondary | ICD-10-CM

## 2018-07-23 NOTE — Progress Notes (Signed)
Preoperative History and Physical  Denise Bryan is a 32 y.o. G4P3013 here for surgical management of chronic pelvic pain.   No significant preoperative concerns.  History of Present Illness: 32 y.o. G4P3013 female who presents for pre-operative evaluation of chronic pelvic pain.  She continues to have pain everyday. It is located in her suprapubic and right and left lower quadrant. The pain comes and goes and described as sharp.  Most of the time the pain is 9/10.  The pain does not radiate. Nothing alleviates the pain. She tries sitting down and using a heating pad.  Sitting still makes it worse.  No associated symptoms.  Denies dysuria.  She has constipation and has to take medication to force a bowel movement.  She believes a lot of her problems have occurred since her BTL.  Her pain was fine after her diagnostic laparoscopy until February-March.  She had none of this pain until after her tubal ligation, which was in 2013. Her menses are twice monthly, lasting  Between 3 days to 2 weeks.  Dysmenorrhea severe, occurring throughout cycle.   Proposed surgery: Diagnostic laparoscopy  Past Medical History:  Diagnosis Date  . Endometriosis   . Family history of ovarian cancer   . Gastroesophageal reflux disease 09/20/2008  . Genetic testing of female 09/2016   TC=8.8% PER [facility]  . Genital herpes simplex 01/02/2015  . Spontaneous abortion   . Unspecified blood type, rh negative    Past Surgical History:  Procedure Laterality Date  . DIAGNOSTIC LAPAROSCOPY  04/26/2010   IUD EXPULSION/PELVIC PAIN  . DILATION AND CURETTAGE OF UTERUS  08/10/10; 08/06/07   HYDATIIDIFORM MOLE/POC, POC  . gallstones  sept 18, 2007  . HYSTEROSCOPY  04/26/2010   IUD EXPULSION /PELVIC PAIN  . LAPAROSCOPY N/A 08/21/2017   Procedure: LAPAROSCOPY DIAGNOSTIC;  Surgeon: Jackson, Stephen D, MD;  Location: [facility] ORS;  Service: Gynecology;  Laterality: N/A;  . TUBAL LIGATION  01/2012   SDJ   OB History  Gravida Para  Term Preterm AB Living  4 3 3 0 1 3  SAB TAB Ectopic Multiple Live Births  1 0 0 0 3    # Outcome Date GA Lbr Len/2nd Weight Sex Delivery Anes PTL Lv  4 Term 01/21/12   6 lb 4 oz (2.835 kg) F Vag-Spont   LIV     Birth Comments: CHTN, PRETERM CERVICAL DILATIONK SUSPECTED ATTEMPT AT [facility] BY PT.  IOL AT 39WKS FOR CERVICAL DILATION  3 Term 07/22/07   7 lb 3 oz (3.26 kg) M Vag-Spont   LIV  2 Term 06/26/06   7 lb 13 oz (3.544 kg) M Vag-Spont   LIV  1 SAB           Patient denies any other pertinent gynecologic issues.   Current Outpatient Medications on File Prior to Visit  Medication Sig Dispense Refill  . acetaminophen (TYLENOL) 500 MG tablet Take 1,000 mg by mouth every 6 (six) hours as needed for mild pain or headache.     No current facility-administered medications on file prior to visit.    Allergies  Allergen Reactions  . Eicosapentaenoic Acid (Epa) Anaphylaxis  . Fish-Derived Products Anaphylaxis  . Iodine Anaphylaxis  . Tramadol Hives    Social History:   reports that she has never smoked. She has never used smokeless tobacco. She reports that she does not drink alcohol or use drugs.  Family History  Problem Relation Age of Onset  . Diabetes Mother   .   Hypertension Mother   . Cancer Mother 40       UTERINE  . Colon cancer Mother   . Cancer Sister 20       CX  . Diabetes Maternal Uncle   . Diabetes Maternal Grandmother   . Hypertension Maternal Grandmother   . Lung disease Maternal Grandfather   . Cancer Maternal Grandfather        LUNG  . Heart disease Paternal Grandmother   . Hypertension Paternal Grandmother     Review of Systems: Noncontributory  PHYSICAL EXAM: Blood pressure 114/70, height 5' 4" (1.626 m), weight 134 lb (60.8 kg), unknown if currently breastfeeding. CONSTITUTIONAL: Well-developed, well-nourished female in no acute distress.  HENT:  Normocephalic, atraumatic, External right and left ear normal. Oropharynx is clear and moist EYES:  Conjunctivae and EOM are normal. Pupils are equal, round, and reactive to light. No scleral icterus.  NECK: Normal range of motion, supple, no masses SKIN: Skin is warm and dry. No rash noted. Not diaphoretic. No erythema. No pallor. NEUROLGIC: Alert and oriented to person, place, and time. Normal reflexes, muscle tone coordination. No cranial nerve deficit noted. PSYCHIATRIC: Normal mood and affect. Normal behavior. Normal judgment and thought content. CARDIOVASCULAR: Normal heart rate noted, regular rhythm RESPIRATORY: Effort and breath sounds normal, no problems with respiration noted ABDOMEN: Soft, nontender, nondistended. PELVIC: Deferred MUSCULOSKELETAL: Normal range of motion. No edema and no tenderness. 2+ distal pulses.  Labs: Results for orders placed or performed in visit on 07/13/18 (from the past 336 hour(s))  Cytology - PAP   Collection Time: 07/13/18 12:00 AM  Result Value Ref Range   Adequacy (A)     Satisfactory for evaluation  endocervical/transformation zone component PRESENT.   Diagnosis ATYPICAL GLANDULAR CELLS. (A)    Diagnosis (A)     TISSUE STUDIES ARE RECOMMENDED IF CLINICALLY INDICATED.   Chlamydia Negative    Neisseria gonorrhea Negative    HPV NOT DETECTED    Material Submitted CervicoVaginal Pap [ThinPrep Imaged] (A)    CYTOLOGY - PAP PAP RESULT   Urine Culture   Collection Time: 07/13/18  4:28 PM  Result Value Ref Range   Urine Culture, Routine Final report    Organism ID, Bacteria Comment     Imaging Studies: No results found.  Assessment: Patient Active Problem List   Diagnosis Date Noted  . Endometriosis 07/02/2017  . Pelvic pain 06/23/2017  . Dyspareunia, female 06/23/2017  . Menorrhagia with irregular cycle 06/23/2017    Plan: Patient will undergo surgical management with diagnostic laparoscopy.   The risks of surgery were discussed in detail with the patient including but not limited to: bleeding which may require transfusion or  reoperation; infection which may require antibiotics; injury to surrounding organs which may involve bowel, bladder, ureters ; need for additional procedures including laparoscopy or laparotomy; thromboembolic phenomenon, surgical site problems and other postoperative/anesthesia complications. Likelihood of success in alleviating the patient's condition was discussed. Routine postoperative instructions will be reviewed with the patient and her family in detail after surgery.  The patient concurred with the proposed plan, giving informed written consent for the surgery.  Preoperative prophylactic antibiotics, as indicated, and SCDs ordered on call to the OR.    Stephen Jackson, MD 07/23/2018 8:21 AM    

## 2018-07-23 NOTE — H&P (View-Only) (Signed)
Preoperative History and Physical  Denise Bryan is a 32 y.o. [phone] here for surgical management of chronic pelvic pain.   No significant preoperative concerns.  History of Present Illness: 32 y.o. [phone] female who presents for pre-operative evaluation of chronic pelvic pain.  She continues to have pain everyday. It is located in her suprapubic and right and left lower quadrant. The pain comes and goes and described as sharp.  Most of the time the pain is 9/10.  The pain does not radiate. Nothing alleviates the pain. She tries sitting down and using a heating pad.  Sitting still makes it worse.  No associated symptoms.  Denies dysuria.  She has constipation and has to take medication to force a bowel movement.  She believes a lot of her problems have occurred since her BTL.  Her pain was fine after her diagnostic laparoscopy until February-March.  She had none of this pain until after her tubal ligation, which was in 2013. Her menses are twice monthly, lasting  Between 3 days to 2 weeks.  Dysmenorrhea severe, occurring throughout cycle.   Proposed surgery: Diagnostic laparoscopy  Past Medical History:  Diagnosis Date  . Endometriosis   . Family history of ovarian cancer   . Gastroesophageal reflux disease 09/20/2008  . Genetic testing of female 09/2016   TC=8.8% PER [facility]  . Genital herpes simplex 01/02/2015  . Spontaneous abortion   . Unspecified blood type, rh negative    Past Surgical History:  Procedure Laterality Date  . DIAGNOSTIC LAPAROSCOPY  04/26/2010   IUD EXPULSION/PELVIC PAIN  . DILATION AND CURETTAGE OF UTERUS  08/10/10; 08/06/07   HYDATIIDIFORM MOLE/POC, POC  . gallstones  sept 18, 2007  . HYSTEROSCOPY  04/26/2010   IUD EXPULSION /PELVIC PAIN  . LAPAROSCOPY N/A 08/21/2017   Procedure: LAPAROSCOPY DIAGNOSTIC;  Surgeon: Will Bonnet, MD;  Location: [facility] ORS;  Service: Gynecology;  Laterality: N/A;  . TUBAL LIGATION  01/2012   SDJ   OB History  Gravida Para  Term Preterm AB Living  _0 0 1 3  SAB TAB Ectopic Multiple Live Births  1 0 0 0 3    # Outcome Date GA Lbr Len/2nd Weight Sex Delivery Anes PTL Lv  4 Term 01/21/12   6 lb 4 oz (2.835 kg) F Vag-Spont   LIV     Birth Comments: CHTN, PRETERM CERVICAL [place] SUSPECTED ATTEMPT AT [facility] BY PT.  IOL AT 39WKS FOR CERVICAL DILATION  3 Term 07/22/07   7 lb 3 oz (3.26 kg) M Vag-Spont   LIV  2 Term 06/26/06   7 lb 13 oz (3.544 kg) M Vag-Spont   LIV  1 SAB           Patient denies any other pertinent gynecologic issues.   Current Outpatient Medications on File Prior to Visit  Medication Sig Dispense Refill  . acetaminophen (TYLENOL) 500 MG tablet Take 1,000 mg by mouth every 6 (six) hours as needed for mild pain or headache.     No current facility-administered medications on file prior to visit.    Allergies  Allergen Reactions  . Eicosapentaenoic Acid (Epa) Anaphylaxis  . Fish-Derived Products Anaphylaxis  . Iodine Anaphylaxis  . Tramadol Hives    Social History:   reports that she has never smoked. She has never used smokeless tobacco. She reports that she does not drink alcohol or use drugs.  Family History  Problem Relation Age of Onset  . Diabetes Mother   .  Hypertension Mother   . Cancer Mother 62       UTERINE  . Colon cancer Mother   . Cancer Sister 20       CX  . Diabetes Maternal Uncle   . Diabetes Maternal Grandmother   . Hypertension Maternal Grandmother   . Lung disease Maternal Grandfather   . Cancer Maternal Grandfather        LUNG  . Heart disease Paternal Grandmother   . Hypertension Paternal Grandmother     Review of Systems: Noncontributory  PHYSICAL EXAM: Blood pressure 114/70, height _0  (1.626 m), weight 134 lb (60.8 kg), unknown if currently breastfeeding. CONSTITUTIONAL: Well-developed, well-nourished female in no acute distress.  HENT:  Normocephalic, atraumatic, External right and left ear normal. Oropharynx is clear and moist EYES:  Conjunctivae and EOM are normal. Pupils are equal, round, and reactive to light. No scleral icterus.  NECK: Normal range of motion, supple, no masses SKIN: Skin is warm and dry. No rash noted. Not diaphoretic. No erythema. No pallor. [place]: Alert and oriented to person, place, and time. Normal reflexes, muscle tone coordination. No cranial nerve deficit noted. PSYCHIATRIC: Normal mood and affect. Normal behavior. Normal judgment and thought content. CARDIOVASCULAR: Normal heart rate noted, regular rhythm RESPIRATORY: Effort and breath sounds normal, no problems with respiration noted ABDOMEN: Soft, nontender, nondistended. PELVIC: Deferred MUSCULOSKELETAL: Normal range of motion. No edema and no tenderness. 2+ distal pulses.  Labs: Results for orders placed or performed in visit on 07/13/18 (from the past 336 hour(s))  Cytology - PAP   Collection Time: 07/13/18 12:00 AM  Result Value Ref Range   Adequacy (A)     Satisfactory for evaluation  endocervical/transformation zone component PRESENT.   Diagnosis ATYPICAL GLANDULAR CELLS. (A)    Diagnosis (A)     TISSUE STUDIES ARE RECOMMENDED IF CLINICALLY INDICATED.   Chlamydia Negative    Neisseria gonorrhea Negative    HPV NOT DETECTED    Material Submitted CervicoVaginal Pap [ThinPrep Imaged] (A)    CYTOLOGY - PAP PAP RESULT   Urine Culture   Collection Time: 07/13/18  4:28 PM  Result Value Ref Range   Urine Culture, Routine Final report    Organism ID, Bacteria Comment     Imaging Studies: No results found.  Assessment: Patient Active Problem List   Diagnosis Date Noted  . Endometriosis 07/02/2017  . Pelvic pain 06/23/2017  . Dyspareunia, female 06/23/2017  . Menorrhagia with irregular cycle 06/23/2017    Plan: Patient will undergo surgical management with diagnostic laparoscopy.   The risks of surgery were discussed in detail with the patient including but not limited to: bleeding which may require transfusion or  reoperation; infection which may require antibiotics; injury to surrounding organs which may involve bowel, bladder, ureters ; need for additional procedures including laparoscopy or laparotomy; thromboembolic phenomenon, surgical site problems and other postoperative/anesthesia complications. Likelihood of success in alleviating the patient's condition was discussed. Routine postoperative instructions will be reviewed with the patient and her family in detail after surgery.  The patient concurred with the proposed plan, giving informed written consent for the surgery.  Preoperative prophylactic antibiotics, as indicated, and SCDs ordered on call to the OR.    Prentice Docker, MD 07/23/2018 8:21 AM

## 2018-07-24 ENCOUNTER — Encounter: Payer: Medicaid Other | Admitting: Obstetrics and Gynecology

## 2018-07-24 NOTE — Telephone Encounter (Signed)
[employer] is active, per [employer].

## 2018-07-27 ENCOUNTER — Encounter
Admission: RE | Admit: 2018-07-27 | Discharge: 2018-07-27 | Disposition: A | Discharge status: Home / Self Care | Payer: Medicaid Other | Source: Ambulatory Visit | Attending: Obstetrics and Gynecology | Admitting: Obstetrics and Gynecology

## 2018-07-27 ENCOUNTER — Other Ambulatory Visit: Payer: Self-pay

## 2018-07-27 DIAGNOSIS — N941 Unspecified dyspareunia: Secondary | ICD-10-CM | POA: Insufficient documentation

## 2018-07-27 DIAGNOSIS — R102 Pelvic and perineal pain: Secondary | ICD-10-CM | POA: Insufficient documentation

## 2018-07-27 DIAGNOSIS — N809 Endometriosis, unspecified: Secondary | ICD-10-CM | POA: Diagnosis not present

## 2018-07-27 DIAGNOSIS — N921 Excessive and frequent menstruation with irregular cycle: Secondary | ICD-10-CM | POA: Diagnosis not present

## 2018-07-27 DIAGNOSIS — Z01818 Encounter for other preprocedural examination: Secondary | ICD-10-CM | POA: Insufficient documentation

## 2018-07-27 LAB — HEMOGLOBIN: Hemoglobin: 14.3 g/dL (ref 12.0–16.0)

## 2018-07-27 NOTE — Patient Instructions (Signed)
Your procedure is scheduled on: Thursday 07/30/18 Report to [facility]. To find out your arrival time please call [phone] between 1PM - 3PM on Wednesday 07/29/18.  Remember: Instructions that are not followed completely may result in serious medical risk, up to and including death, or upon the discretion of your surgeon and anesthesiologist your surgery may need to be rescheduled.     _X__ 1. Do not eat food after midnight the night before your procedure.                 No gum chewing or hard candies. You may drink clear liquids up to 2 hours                 before you are scheduled to arrive for your surgery- DO not drink clear                 liquids within 2 hours of the start of your surgery.                 Clear Liquids include:  water, apple juice without pulp, clear carbohydrate                 drink such as Clearfast or Gatorade, Black Coffee or Tea (Do not add                 anything to coffee or tea).  __X__2.  On the morning of surgery brush your teeth with toothpaste and water, you                 may rinse your mouth with mouthwash if you wish.  Do not swallow any              toothpaste of mouthwash.     _X__ 3.  No Alcohol for 24 hours before or after surgery.   _X__ 4.  Do Not Smoke or use e-cigarettes For 24 Hours Prior to Your Surgery.                 Do not use any chewable tobacco products for at least 6 hours prior to                 surgery.  ____  5.  Bring all medications with you on the day of surgery if instructed.   __X__  6.  Notify your doctor if there is any change in your medical condition      (cold, fever, infections).     Do not wear jewelry, make-up, hairpins, clips or nail polish. Do not wear lotions, powders, or perfumes.  Do not shave 48 hours prior to surgery. Men may shave face and neck. Do not bring valuables to the hospital.    [facility] is not responsible for any belongings or  valuables.  Contacts, dentures/partials or body piercings may not be worn into surgery. Bring a case for your contacts, glasses or hearing aids, a denture cup will be supplied. Leave your suitcase in the car. After surgery it may be brought to your room. For patients admitted to the hospital, discharge time is determined by your treatment team.   Patients discharged the day of surgery will not be allowed to drive home.   Please read over the following fact sheets that you were given:   MRSA Information  __X__ Take these medicines the morning of surgery with A SIP OF WATER:  1. none  2.   3.   4.  5.  6.  ____ Fleet Enema (as directed)   __X__ Use CHG Soap/SAGE wipes as directed  ____ Use inhalers on the day of surgery  ____ Stop metformin/Janumet/Farxiga 2 days prior to surgery    ____ Take 1/2 of usual insulin dose the night before surgery. No insulin the morning          of surgery.   ____ Stop Blood Thinners Coumadin/Plavix/Xarelto/Pleta/Pradaxa/Eliquis/Effient/Aspirin  on   Or contact your Surgeon, Cardiologist or Medical Doctor regarding  ability to stop your blood thinners  __X__ Stop Anti-inflammatories 7 days before surgery such as Advil, Ibuprofen, Motrin,  BC or Goodies Powder, Naprosyn, Naproxen, Aleve, Aspirin STOP TODAY   __X__ Stop all herbal supplements, fish oil or vitamin E until after surgery.    ____ Bring C-Pap to the hospital.

## 2018-07-30 ENCOUNTER — Ambulatory Visit: Payer: Medicaid Other | Admitting: Certified Registered Nurse Anesthetist

## 2018-07-30 ENCOUNTER — Encounter
Admission: RE | Disposition: A | Discharge status: Home / Self Care | Payer: Self-pay | Source: Ambulatory Visit | Attending: Obstetrics and Gynecology

## 2018-07-30 ENCOUNTER — Encounter: Payer: Self-pay | Admitting: *Deleted

## 2018-07-30 ENCOUNTER — Ambulatory Visit
Admission: RE | Admit: 2018-07-30 | Discharge: 2018-07-30 | Disposition: A | Discharge status: Home / Self Care | Payer: Medicaid Other | Source: Ambulatory Visit | Attending: Obstetrics and Gynecology | Admitting: Obstetrics and Gynecology

## 2018-07-30 DIAGNOSIS — N838 Other noninflammatory disorders of ovary, fallopian tube and broad ligament: Secondary | ICD-10-CM | POA: Diagnosis not present

## 2018-07-30 DIAGNOSIS — K5900 Constipation, unspecified: Secondary | ICD-10-CM | POA: Diagnosis not present

## 2018-07-30 DIAGNOSIS — G8929 Other chronic pain: Secondary | ICD-10-CM | POA: Insufficient documentation

## 2018-07-30 DIAGNOSIS — Z91013 Allergy to seafood: Secondary | ICD-10-CM | POA: Diagnosis not present

## 2018-07-30 DIAGNOSIS — N921 Excessive and frequent menstruation with irregular cycle: Secondary | ICD-10-CM | POA: Diagnosis not present

## 2018-07-30 DIAGNOSIS — K219 Gastro-esophageal reflux disease without esophagitis: Secondary | ICD-10-CM | POA: Diagnosis not present

## 2018-07-30 DIAGNOSIS — Z888 Allergy status to other drugs, medicaments and biological substances status: Secondary | ICD-10-CM | POA: Insufficient documentation

## 2018-07-30 DIAGNOSIS — A6000 Herpesviral infection of urogenital system, unspecified: Secondary | ICD-10-CM | POA: Diagnosis not present

## 2018-07-30 DIAGNOSIS — G894 Chronic pain syndrome: Secondary | ICD-10-CM | POA: Diagnosis not present

## 2018-07-30 DIAGNOSIS — Z885 Allergy status to narcotic agent status: Secondary | ICD-10-CM | POA: Diagnosis not present

## 2018-07-30 DIAGNOSIS — R102 Pelvic and perineal pain: Secondary | ICD-10-CM | POA: Diagnosis present

## 2018-07-30 DIAGNOSIS — N809 Endometriosis, unspecified: Secondary | ICD-10-CM | POA: Diagnosis present

## 2018-07-30 HISTORY — DX: Headache: R51

## 2018-07-30 HISTORY — PX: LAPAROSCOPY: SHX197

## 2018-07-30 HISTORY — DX: Headache, unspecified: R51.9

## 2018-07-30 LAB — POCT PREGNANCY, URINE: Preg Test, Ur: NEGATIVE

## 2018-07-30 SURGERY — LAPAROSCOPY, DIAGNOSTIC
Anesthesia: General | Site: Abdomen

## 2018-07-30 MED ORDER — KETOROLAC TROMETHAMINE 30 MG/ML IJ SOLN
INTRAMUSCULAR | Status: DC | PRN
  Administered 2018-07-30: 30 mg via INTRAVENOUS

## 2018-07-30 MED ORDER — LACTATED RINGERS IV SOLN
INTRAVENOUS | Status: DC
  Administered 2018-07-30: 125 mL/h via INTRAVENOUS

## 2018-07-30 MED ORDER — MIDAZOLAM HCL 2 MG/2ML IJ SOLN
INTRAMUSCULAR | Status: AC
  Filled 2018-07-30: qty 2

## 2018-07-30 MED ORDER — PROPOFOL 10 MG/ML IV BOLUS
INTRAVENOUS | Status: DC | PRN
  Administered 2018-07-30: 140 mg via INTRAVENOUS

## 2018-07-30 MED ORDER — MIDAZOLAM HCL 2 MG/2ML IJ SOLN
INTRAMUSCULAR | Status: DC | PRN
  Administered 2018-07-30: 2 mg via INTRAVENOUS

## 2018-07-30 MED ORDER — SUGAMMADEX SODIUM 200 MG/2ML IV SOLN
INTRAVENOUS | Status: AC
  Filled 2018-07-30: qty 2

## 2018-07-30 MED ORDER — FENTANYL CITRATE (PF) 100 MCG/2ML IJ SOLN
INTRAMUSCULAR | Status: DC | PRN
  Administered 2018-07-30: 100 ug via INTRAVENOUS
  Administered 2018-07-30: 50 ug via INTRAVENOUS

## 2018-07-30 MED ORDER — BUPIVACAINE HCL (PF) 0.5 % IJ SOLN
INTRAMUSCULAR | Status: AC
  Filled 2018-07-30: qty 30

## 2018-07-30 MED ORDER — FENTANYL CITRATE (PF) 100 MCG/2ML IJ SOLN
INTRAMUSCULAR | Status: AC
  Filled 2018-07-30: qty 4

## 2018-07-30 MED ORDER — ONDANSETRON HCL 4 MG/2ML IJ SOLN: 4.0000 mg | Freq: Once | INTRAMUSCULAR | Status: DC | PRN

## 2018-07-30 MED ORDER — SUGAMMADEX SODIUM 200 MG/2ML IV SOLN
INTRAVENOUS | Status: DC | PRN
  Administered 2018-07-30: 120 mg via INTRAVENOUS

## 2018-07-30 MED ORDER — SUCCINYLCHOLINE CHLORIDE 20 MG/ML IJ SOLN
INTRAMUSCULAR | Status: DC | PRN
  Administered 2018-07-30: 80 mg via INTRAVENOUS

## 2018-07-30 MED ORDER — PHENYLEPHRINE HCL 10 MG/ML IJ SOLN
INTRAMUSCULAR | Status: DC | PRN
  Administered 2018-07-30 (×4): 100 ug via INTRAVENOUS
  Administered 2018-07-30: 200 ug via INTRAVENOUS

## 2018-07-30 MED ORDER — ROCURONIUM BROMIDE 100 MG/10ML IV SOLN
INTRAVENOUS | Status: DC | PRN
  Administered 2018-07-30: 10 mg via INTRAVENOUS
  Administered 2018-07-30 (×2): 20 mg via INTRAVENOUS

## 2018-07-30 MED ORDER — HYDROCODONE-ACETAMINOPHEN 5-325 MG PO TABS
1.0000 | ORAL_TABLET | Freq: Once | ORAL | Status: AC
  Administered 2018-07-30: 1 via ORAL

## 2018-07-30 MED ORDER — FAMOTIDINE 20 MG PO TABS
ORAL_TABLET | ORAL | Status: AC
  Administered 2018-07-30: 20 mg via ORAL
  Filled 2018-07-30: qty 1

## 2018-07-30 MED ORDER — FAMOTIDINE 20 MG PO TABS
20.0000 mg | ORAL_TABLET | Freq: Once | ORAL | Status: AC
  Administered 2018-07-30: 20 mg via ORAL

## 2018-07-30 MED ORDER — LIDOCAINE HCL (CARDIAC) PF 100 MG/5ML IV SOSY
PREFILLED_SYRINGE | INTRAVENOUS | Status: DC | PRN
  Administered 2018-07-30: 100 mg via INTRAVENOUS

## 2018-07-30 MED ORDER — ACETAMINOPHEN 10 MG/ML IV SOLN
INTRAVENOUS | Status: DC | PRN
  Administered 2018-07-30: 1000 mg via INTRAVENOUS

## 2018-07-30 MED ORDER — FENTANYL CITRATE (PF) 100 MCG/2ML IJ SOLN
INTRAMUSCULAR | Status: AC
  Filled 2018-07-30: qty 2

## 2018-07-30 MED ORDER — BUPIVACAINE HCL 0.5 % IJ SOLN
INTRAMUSCULAR | Status: DC | PRN
  Administered 2018-07-30: 7 mL

## 2018-07-30 MED ORDER — DEXAMETHASONE SODIUM PHOSPHATE 10 MG/ML IJ SOLN
INTRAMUSCULAR | Status: DC | PRN
  Administered 2018-07-30: 5 mg via INTRAVENOUS

## 2018-07-30 MED ORDER — FENTANYL CITRATE (PF) 100 MCG/2ML IJ SOLN
25.0000 ug | INTRAMUSCULAR | Status: AC | PRN
  Administered 2018-07-30 (×6): 25 ug via INTRAVENOUS

## 2018-07-30 MED ORDER — HYDROCODONE-ACETAMINOPHEN 5-325 MG PO TABS: 1.0000 | ORAL_TABLET | ORAL | 0 refills | Status: DC | PRN

## 2018-07-30 MED ORDER — ONDANSETRON HCL 4 MG/2ML IJ SOLN
INTRAMUSCULAR | Status: DC | PRN
  Administered 2018-07-30: 4 mg via INTRAVENOUS

## 2018-07-30 MED ORDER — HYDROCODONE-ACETAMINOPHEN 5-325 MG PO TABS
ORAL_TABLET | ORAL | Status: AC
  Filled 2018-07-30: qty 1

## 2018-07-30 MED ORDER — PROPOFOL 10 MG/ML IV BOLUS
INTRAVENOUS | Status: AC
  Filled 2018-07-30: qty 20

## 2018-07-30 MED ORDER — KETOROLAC TROMETHAMINE 30 MG/ML IJ SOLN
INTRAMUSCULAR | Status: AC
  Filled 2018-07-30: qty 1

## 2018-07-30 MED ORDER — LACTATED RINGERS IV SOLN
INTRAVENOUS | Status: DC | PRN
  Administered 2018-07-30 (×2): via INTRAVENOUS

## 2018-07-30 MED ORDER — ACETAMINOPHEN 10 MG/ML IV SOLN
INTRAVENOUS | Status: AC
  Filled 2018-07-30: qty 100

## 2018-07-30 SURGICAL SUPPLY — 48 items
BAG URINE DRAINAGE (UROLOGICAL SUPPLIES) ×2 IMPLANT
BLADE SURG SZ11 CARB STEEL (BLADE) ×2 IMPLANT
CANISTER SUCT 1200ML W/VALVE (MISCELLANEOUS) ×2 IMPLANT
CATH FOLEY 2WAY  5CC 16FR (CATHETERS) ×1
CATH URTH 16FR FL 2W BLN LF (CATHETERS) ×1 IMPLANT
CHLORAPREP W/TINT 26ML (MISCELLANEOUS) ×2 IMPLANT
DERMABOND ADVANCED (GAUZE/BANDAGES/DRESSINGS) ×1
DERMABOND ADVANCED .7 DNX12 (GAUZE/BANDAGES/DRESSINGS) ×1 IMPLANT
DRAPE LEGGINS SURG 28X43 STRL (DRAPES) ×2 IMPLANT
DRAPE SHEET LG 3/4 BI-LAMINATE (DRAPES) ×2 IMPLANT
DRAPE UNDER BUTTOCK W/FLU (DRAPES) ×2 IMPLANT
DRSG TELFA 3X8 NADH (GAUZE/BANDAGES/DRESSINGS) ×2 IMPLANT
ELECT REM PT RETURN 9FT ADLT (ELECTROSURGICAL) ×2
ELECTRODE REM PT RTRN 9FT ADLT (ELECTROSURGICAL) ×1 IMPLANT
GLOVE BIO SURGEON STRL SZ7 (GLOVE) ×4 IMPLANT
GLOVE BIOGEL PI IND STRL 7.5 (GLOVE) ×1 IMPLANT
GLOVE BIOGEL PI INDICATOR 7.5 (GLOVE) ×1
GOWN STRL REUS W/ TWL LRG LVL3 (GOWN DISPOSABLE) ×2 IMPLANT
GOWN STRL REUS W/TWL LRG LVL3 (GOWN DISPOSABLE) ×2
GRASPER SUT TROCAR 14GX15 (MISCELLANEOUS) ×2 IMPLANT
IRRIGATION STRYKERFLOW (MISCELLANEOUS) ×1 IMPLANT
IRRIGATOR STRYKERFLOW (MISCELLANEOUS) ×2
IV LACTATED RINGERS 1000ML (IV SOLUTION) ×2 IMPLANT
KIT PINK PAD W/HEAD ARE REST (MISCELLANEOUS) ×2
KIT PINK PAD W/HEAD ARM REST (MISCELLANEOUS) ×1 IMPLANT
KIT TURNOVER CYSTO (KITS) ×2 IMPLANT
LABEL OR SOLS (LABEL) ×2 IMPLANT
LIGASURE VESSEL 5MM BLUNT TIP (ELECTROSURGICAL) ×2 IMPLANT
MANIPULATOR UTERINE 4.5 ZUMI (MISCELLANEOUS) ×2 IMPLANT
NEEDLE HYPO 22GX1.5 SAFETY (NEEDLE) ×2 IMPLANT
NS IRRIG 500ML POUR BTL (IV SOLUTION) ×2 IMPLANT
PACK LAP CHOLECYSTECTOMY (MISCELLANEOUS) ×2 IMPLANT
PAD OB MATERNITY 4.3X12.25 (PERSONAL CARE ITEMS) ×2 IMPLANT
PAD PREP 24X41 OB/GYN DISP (PERSONAL CARE ITEMS) ×2 IMPLANT
SCISSORS METZENBAUM CVD 33 (INSTRUMENTS) IMPLANT
SHEARS HARMONIC ACE PLUS 36CM (ENDOMECHANICALS) ×2 IMPLANT
SLEEVE ENDOPATH XCEL 5M (ENDOMECHANICALS) ×4 IMPLANT
SOL PREP PVP 2OZ (MISCELLANEOUS) ×2
SOLUTION PREP PVP 2OZ (MISCELLANEOUS) ×1 IMPLANT
SURGILUBE 2OZ TUBE FLIPTOP (MISCELLANEOUS) ×2 IMPLANT
SUT MNCRL 4-0 (SUTURE) ×1
SUT MNCRL 4-0 27XMFL (SUTURE) ×1
SUT VIC AB 2-0 UR6 27 (SUTURE) IMPLANT
SUTURE MNCRL 4-0 27XMF (SUTURE) ×1 IMPLANT
TROCAR ENDO BLADELESS 11MM (ENDOMECHANICALS) ×2 IMPLANT
TROCAR XCEL NON-BLD 5MMX100MML (ENDOMECHANICALS) ×2 IMPLANT
TROCAR XCEL UNIV SLVE 11M 100M (ENDOMECHANICALS) ×2 IMPLANT
TUBING INSUFFLATION (TUBING) ×2 IMPLANT

## 2018-07-30 NOTE — Transfer of Care (Cosign Needed)
Immediate Anesthesia Transfer of Care Note  Patient: Denise Bryan  Procedure(s) Performed: LAPAROSCOPY DIAGNOSTIC WITH BILATERAL SALPINGECTOMY (N/A Abdomen)  Patient Location: PACU  Anesthesia Type:General  Level of Consciousness: sedated  Airway & Oxygen Therapy: Patient Spontanous Breathing and Patient connected to face mask oxygen  Post-op Assessment: Report given to RN and Post -op Vital signs reviewed and stable  Post vital signs: Reviewed and stable  Last Vitals:  Vitals Value Taken Time  BP 113/70 07/30/2018  5:45 PM  Temp 36 C 07/30/2018  5:44 PM  Pulse 52 07/30/2018  5:46 PM  Resp 14 07/30/2018  5:46 PM  SpO2 100 % 07/30/2018  5:46 PM  Vitals shown include unvalidated device data.  Last Pain:  Vitals:   07/30/18 1415  TempSrc: Oral  PainSc: 2       Patients Stated Pain Goal: 0 (84/[id number]/59 [id number])  Complications: No apparent anesthesia complications

## 2018-07-30 NOTE — Anesthesia Postprocedure Evaluation (Signed)
Anesthesia Post Note  Patient: Denise Bryan  Procedure(s) Performed: LAPAROSCOPY DIAGNOSTIC WITH BILATERAL SALPINGECTOMY (N/A Abdomen)  Patient location during evaluation: PACU Anesthesia Type: General Level of consciousness: awake and alert Pain management: pain level controlled Vital Signs Assessment: post-procedure vital signs reviewed and stable Respiratory status: spontaneous breathing, nonlabored ventilation, respiratory function stable and patient connected to nasal cannula oxygen Cardiovascular status: blood pressure returned to baseline and stable Postop Assessment: no apparent nausea or vomiting Anesthetic complications: no     Last Vitals:  Vitals:   07/30/18 1830 07/30/18 1845  BP: 98/66 96/68  Pulse: 72 79  Resp: 15 13  Temp:    SpO2: 97% 98%    Last Pain:  Vitals:   07/30/18 1827  TempSrc:   PainSc: [place]

## 2018-07-30 NOTE — Interval H&P Note (Signed)
History and Physical Interval Note:  07/30/2018 2:45 PM  Denise Bryan  has presented today for surgery, with the diagnosis of endometriosis, chronic pelvic pain  The various methods of treatment have been discussed with the patient and family. After consideration of risks, benefits and other options for treatment, the patient has consented to  Procedure(s): LAPAROSCOPY DIAGNOSTIC (N/A) as a surgical intervention .  The patient's history has been reviewed, patient examined, no change in status, stable for surgery.  I have reviewed the patient's chart and labs.  Questions were answered to the patient's satisfaction.  Consents reviewed and patient agrees to proceed.   Prentice Docker, MD, [facility], [facility] 07/30/2018 2:46 PM

## 2018-07-30 NOTE — Anesthesia Procedure Notes (Cosign Needed)
Procedure Name: Intubation Date/Time: 07/30/2018 4:27 PM Performed by: Gunnar Bulla, MD Pre-anesthesia Checklist: Patient identified, Emergency Drugs available, Suction available, Patient being monitored and Timeout performed Patient Re-evaluated:Patient Re-evaluated prior to induction Oxygen Delivery Method: Circle system utilized Preoxygenation: Pre-oxygenation with 100% oxygen Induction Type: IV induction Ventilation: Mask ventilation without difficulty Laryngoscope Size: Mac and 3 Grade View: Grade I Tube type: Oral Tube size: 7.0 mm Number of attempts: 1 Airway Equipment and Method: Stylet Placement Confirmation: ETT inserted through vocal cords under direct vision,  positive ETCO2 and breath sounds checked- equal and bilateral Secured at: 21 cm Tube secured with: Tape Dental Injury: Teeth and Oropharynx as per pre-operative assessment

## 2018-07-30 NOTE — Discharge Instructions (Signed)
AMBULATORY SURGERY  °DISCHARGE INSTRUCTIONS ° ° °1) The drugs that you were given will stay in your system until tomorrow so for the next 24 hours you should not: ° °A) Drive an automobile °B) Make any legal decisions °C) Drink any alcoholic beverage ° ° °2) You may resume regular meals tomorrow.  Today it is better to start with liquids and gradually work up to solid foods. ° °You may eat anything you prefer, but it is better to start with liquids, then soup and crackers, and gradually work up to solid foods. ° ° °3) Please notify your doctor immediately if you have any unusual bleeding, trouble breathing, redness and pain at the surgery site, drainage, fever, or pain not relieved by medication. ° ° ° °4) Additional Instructions: ° ° ° ° ° ° ° °Please contact your physician with any problems or Same Day Surgery at [phone], Monday through Friday 6 am to 4 pm, or [place] [facility] [place] Main number at [phone].AMBULATORY SURGERY  °DISCHARGE INSTRUCTIONS ° ° °5) The drugs that you were given will stay in your system until tomorrow so for the next 24 hours you should not: ° °D) Drive an automobile °E) Make any legal decisions °F) Drink any alcoholic beverage ° ° °6) You may resume regular meals tomorrow.  Today it is better to start with liquids and gradually work up to solid foods. ° °You may eat anything you prefer, but it is better to start with liquids, then soup and crackers, and gradually work up to solid foods. ° ° °7) Please notify your doctor immediately if you have any unusual bleeding, trouble breathing, redness and pain at the surgery site, drainage, fever, or pain not relieved by medication. ° ° ° °8) Additional Instructions: ° ° ° ° ° ° ° °Please contact your physician with any problems or Same Day Surgery at [phone], Monday through Friday 6 am to 4 pm, or [place] [facility] [place] Main number at [phone]. °

## 2018-07-30 NOTE — Op Note (Signed)
Operative Note    Pre-Op Diagnosis:  1) Chronic pelvic pain 2) Suspect endometriosis  Post-Op Diagnosis:  1) Chronic pelvic pain 2) Suspect endometriosis  Procedures:  1. Diagnostic laparoscopy 2. Bilateral distal salpingectomy  Primary Surgeon: Prentice Docker, MD   EBL: 20 mL   IVF: 1,000 mL   Urine output: 250 mL clear urine at end of case  Specimens:  1) right distal fallopian tube (status post partial salpingectomy) 2) left distal fallopian tube (status post partial salpingectomy)  Drains: none  Complications: None   Disposition: PACU   Condition: Stable   Findings:  1) globally enlarged uterus 2) multiple cystic lesions on bilateral distal fallopian tubes 3) posterior cul-de-sac Allen-Masters window just distal to cervix, approximately 1 cm in width.  4) non-visualization of appendix 5) no other evidence of endometriosis  Procedure Summary:  The patient was taken to the operating room where general anesthesia was administered and found to be adequate. She was placed in the dorsal supine lithotomy position in Be[place]ley stirrups and prepped and draped in usual sterile fashion. After a timeout was called an indwelling catheter was placed in her bladder. A sterile speculum was placed in the vagina and a single-tooth tenaculum was used to grasp the anterior lip of the cervix. A Zumi uterine manipulator was affixed to the cervix in accordance with the manufacturer's recommendations. The speculum was removed from the vagina.  Attention was turned to the abdomen where after injection of local anesthetic, a 5 mm infraumbilical incision was made with the scalpel. Entry into the abdomen was obtained via Optiview trocar technique (a blunt entry technique with camera visualization through the obturator upon entry). Verification of entry into the abdomen was obtained using opening pressures. The abdomen was insufflated with CO2. The camera was introduced through the trocar with  verification of atraumatic entry.  A suprapubic 5 mm trocar site was created under direct intra-abdominal camera visualization without difficulty.  A left lower quadrant 11 mm trocar site was created under direct intra-abdominal camera visualization without difficulty.  Attention was turned to the survey portion of the surgery.  The findings are noted above.  Given history of partial bilateral salpingectomy for sterilization and findings of multiple cystic lesions on the distal bilateral fallopian tubes, the distal bilateral fallopian tubal segments were removed.  This was accomplished using the LigaSure device with cauterization and transection along the mesosalpinx with great care taken to avoid ovarian blood supply.  The Allen-Masters window was grasped at its deepest point and elevated.  Decision made not to excise the peritoneum was made given the proximity to the rectum.  The procedure was terminated at this point.  The left lower quadrant 11 mm trocar was removed.  The fascia was reapproximated using 0 Vicryl using a Carter-Thomason device.  The skin was closed at this port site using 4-0 Monocryl.  The abdomen was emptied of CO2 with the help of 5 deep breaths from anesthesia.  The remaining 2 trochars were removed.  The subcutaneous tissue was reapproximated just deep to the dermal layer to provide support and reduce tension on the skin closure.  All incision sites were closed with surgical skin glue.  Attention was returned to the pelvis.  The Foley catheter was removed.  The ZUMI uterine manipulator was removed.  A speculum was placed in the vagina and hemostasis was noted at the tenaculum entry sites and from the cervical office.  Verification of no instruments or sponges left in the vagina was accomplished.  The speculum was removed.  The patient tolerated the procedure well.  Sponge, lap, needle, and instrument counts were correct x 2.  VTE prophylaxis: SCDs. Antibiotic prophylaxis: none  indicated nor given. She was awakened in the operating room and was taken to the PACU in stable condition.   Prentice Docker, MD 07/30/2018 5:34 PM

## 2018-07-30 NOTE — Anesthesia Post-op Follow-up Note (Cosign Needed)
Anesthesia QCDR form completed.        

## 2018-07-30 NOTE — Anesthesia Preprocedure Evaluation (Signed)
Anesthesia Evaluation  Patient identified by MRN, date of birth, ID band Patient awake    Reviewed: Allergy & Precautions, NPO status , Patient's Chart, lab work & pertinent test results, reviewed documented beta blocker date and time   Airway Mallampati: II  TM Distance: >3 FB     Dental  (+) Chipped   Pulmonary           Cardiovascular      Neuro/Psych  Headaches,    GI/Hepatic GERD  Controlled,  Endo/Other    Renal/GU      Musculoskeletal   Abdominal   Peds  Hematology   Anesthesia Other Findings   Reproductive/Obstetrics                             Anesthesia Physical Anesthesia Plan  ASA: II  Anesthesia Plan: General   Post-op Pain Management:    Induction: Intravenous  PONV Risk Score and Plan:   Airway Management Planned: Oral ETT  Additional Equipment:   Intra-op Plan:   Post-operative Plan:   Informed Consent: I have reviewed the patients History and Physical, chart, labs and discussed the procedure including the risks, benefits and alternatives for the proposed anesthesia with the patient or authorized representative who has indicated his/her understanding and acceptance.     Plan Discussed with: CRNA  Anesthesia Plan Comments:         Anesthesia Quick Evaluation

## 2018-07-31 ENCOUNTER — Encounter: Payer: Self-pay | Admitting: Obstetrics and Gynecology

## 2018-08-03 LAB — SURGICAL PATHOLOGY

## 2018-08-04 ENCOUNTER — Telehealth: Payer: Self-pay | Admitting: Obstetrics and Gynecology

## 2018-08-04 NOTE — Telephone Encounter (Signed)
Patient is calling for labs results. Please advise. 

## 2018-08-06 ENCOUNTER — Ambulatory Visit (INDEPENDENT_AMBULATORY_CARE_PROVIDER_SITE_OTHER): Payer: Medicaid Other | Admitting: Obstetrics and Gynecology

## 2018-08-06 ENCOUNTER — Encounter: Payer: Self-pay | Admitting: Obstetrics and Gynecology

## 2018-08-06 ENCOUNTER — Other Ambulatory Visit (HOSPITAL_COMMUNITY)
Admission: RE | Admit: 2018-08-06 | Discharge: 2018-08-06 | Disposition: A | Discharge status: Home / Self Care | Payer: Medicaid Other | Source: Ambulatory Visit | Attending: Obstetrics and Gynecology | Admitting: Obstetrics and Gynecology

## 2018-08-06 VITALS — BP 118/74 | Ht 64.0 in | Wt 131.0 lb

## 2018-08-06 DIAGNOSIS — R87619 Unspecified abnormal cytological findings in specimens from cervix uteri: Secondary | ICD-10-CM | POA: Diagnosis present

## 2018-08-06 DIAGNOSIS — N870 Mild cervical dysplasia: Secondary | ICD-10-CM | POA: Insufficient documentation

## 2018-08-06 DIAGNOSIS — Z09 Encounter for follow-up examination after completed treatment for conditions other than malignant neoplasm: Secondary | ICD-10-CM

## 2018-08-06 NOTE — Progress Notes (Signed)
HPI:  Denise Bryan is a 32 y.o.  [phone]  who presents today for evaluation and management of abnormal cervical cytology.    Dysplasia History:  Atypical glandular cells (no subtype), HPV negative   OB History  Gravida Para Term Preterm AB Living  '4 3 3 '$ 0 1 3  SAB TAB Ectopic Multiple Live Births  1 0 0 0 3    # Outcome Date GA Lbr Len/2nd Weight Sex Delivery Anes PTL Lv  4 Term 01/21/12   6 lb 4 oz (2.835 kg) F Vag-Spont   LIV     Birth Comments: CHTN, PRETERM CERVICAL [place] SUSPECTED ATTEMPT AT [facility] BY PT.  IOL AT 39WKS FOR CERVICAL DILATION  3 Term 07/22/07   7 lb 3 oz (3.26 kg) M Vag-Spont   LIV  2 Term 06/26/06   7 lb 13 oz (3.544 kg) M Vag-Spont   LIV  1 SAB             Past Medical History:  Diagnosis Date  . Endometriosis   . Family history of ovarian cancer   . Gastroesophageal reflux disease 09/20/2008   no problems since gallbladder taken out!! 2007  . Genetic testing of female 09/2016   TC=8.8% PER [facility]  . Genital herpes simplex 01/02/2015  . Headache    migraines, require a visit to the emergency room  . Spontaneous abortion   . Unspecified blood type, rh negative     Past Surgical History:  Procedure Laterality Date  . CHOLECYSTECTOMY  2007  . DIAGNOSTIC LAPAROSCOPY  04/26/2010   IUD EXPULSION/PELVIC PAIN  . DILATION AND CURETTAGE OF UTERUS  08/10/10; 08/06/07   HYDATIIDIFORM MOLE/POC, POC  . gallstones  sept 18, 2007  . HYSTEROSCOPY  04/26/2010   IUD EXPULSION /PELVIC PAIN  . LAPAROSCOPY N/A 08/21/2017   Procedure: LAPAROSCOPY DIAGNOSTIC;  Surgeon: Will Bonnet, MD;  Location: [facility] ORS;  Service: Gynecology;  Laterality: N/A;  . LAPAROSCOPY N/A 07/30/2018   Procedure: LAPAROSCOPY DIAGNOSTIC WITH BILATERAL SALPINGECTOMY;  Surgeon: Will Bonnet, MD;  Location: [facility] ORS;  Service: Gynecology;  Laterality: N/A;  . TUBAL LIGATION  01/2012   SDJ    SOCIAL HISTORY:  Social History   Substance and Sexual Activity  Alcohol Use No     Social History   Substance and Sexual Activity  Drug Use No     Family History  Problem Relation Age of Onset  . Diabetes Mother   . Hypertension Mother   . Cancer Mother 74       UTERINE  . Colon cancer Mother   . Cancer Sister 20       CX  . Diabetes Maternal Uncle   . Diabetes Maternal Grandmother   . Hypertension Maternal Grandmother   . Lung disease Maternal Grandfather   . Cancer Maternal Grandfather        LUNG  . Heart disease Paternal Grandmother   . Hypertension Paternal Grandmother     ALLERGIES:  Eicosapentaenoic acid (epa); Fish-derived products; Iodine; and Tramadol  Current Outpatient Medications on File Prior to Visit  Medication Sig Dispense Refill  . HYDROcodone-acetaminophen (NORCO) 5-325 MG tablet Take 1 tablet by mouth every 4 (four) hours as needed for moderate pain. 30 tablet 0  . ibuprofen (ADVIL,MOTRIN) 800 MG tablet Take 800 mg by mouth every 6 (six) hours as needed (for pain.).     No current facility-administered medications on file prior to visit.     Physical Exam: -  Vitals:  BP 118/74   Ht '5\' 4"'$  (1.626 m)   Wt 131 lb (59.4 kg)   LMP 07/23/2018   BMI 22.49 kg/m  GEN: WD, WN, NAD.  A+ O x 3, good mood and affect. ABD:  NT, ND.  Soft, no masses.  No hernias noted.   Pelvic:   Vulva: Normal appearance.  No lesions.  Vagina: No lesions or abnormalities noted.  Support: Normal pelvic support.  Urethra No masses tenderness or scarring.  Meatus Normal size without lesions or prolapse.  Cervix: See below.  Anus: Normal exam.  No lesions.  Perineum: Normal exam.  No lesions.        Bimanual   Uterus: Normal size.  Non-tender.  Mobile.  AV.  Adnexae: No masses.  Non-tender to palpation.  Cul-de-sac: Negative for abnormality.   PROCEDURE: 1.  Urine Pregnancy Test:  not done 2.  Colposcopy performed with 4% acetic acid after verbal consent obtained                                         -Aceto-white Lesions Location(s): 6, 10,  and 2 o'clock.              -Biopsy performed at 6, 10, and 2 o'clock               -ECC indicated and performed: Yes.       -Biopsy sites made hemostatic with pressure, AgNO3, and/or Monsel's solution   -Satisfactory colposcopy: Yes.      -Evidence of Invasive cervical CA :  NO  ASSESSMENT:  Denise Bryan is a 32 y.o. [phone] here for  1. Atypical glandular cells of undetermined significance of cervix    PLAN: I discussed the grading system of pap smears and HPV high risk viral types.  We will discuss and base management after colpo results return.     Prentice Docker, MD  [facility] 08/06/2018  3:12 PM

## 2018-08-06 NOTE — Telephone Encounter (Signed)
Pt was seen today for post op and Colpo with SDJ

## 2018-08-06 NOTE — Progress Notes (Signed)
   Postoperative Follow-up Patient presents post op from diagnostic laparoscopy 7 days ago for pelvic pain.  Subjective: Patient reports marked improvement in her preop symptoms. Eating a regular diet without difficulty. pain is moderately well controlled with ibuprofen  Activity: increasing slowly.  Objective: Vitals:   08/06/18 1426  BP: 118/74   Vital Signs: BP 118/74   Ht 5\' 4"  (1.626 m)   Wt 131 lb (59.4 kg)   LMP 07/23/2018   BMI 22.49 kg/m  Constitutional: Well nourished, well developed female in no acute distress.  HEENT: normal Skin: Warm and dry.  Extremity: no edema  Abdomen: Soft, non-tender, normal bowel sounds; no bruits, organomegaly or masses. clean, dry, intact and without erythema, induration, warmth, and tenderness  Pelvic exam: (female chaperone present) See colposcopy note   Assessment: 32 y.o. s/p diagnostic laparoscopy progressing well  Plan: Patient has done well after surgery with no apparent complications.  I have discussed the post-operative course to date, and the expected progress moving forward.  The patient understands what complications to be concerned about.  I will see the patient in routine follow up, or sooner if needed.    Activity plan: increase activity slowly  Prentice Docker, MD 08/06/2018, 3:12 PM

## 2018-08-10 ENCOUNTER — Telehealth: Payer: Self-pay

## 2018-08-10 NOTE — Telephone Encounter (Signed)
Patient is returning missed call. Please advise 

## 2018-08-10 NOTE — Telephone Encounter (Signed)
Pt calling for BS to call her back re appt - has question.  [phone]

## 2018-08-10 NOTE — Telephone Encounter (Signed)
Please let me know when pt returns my call

## 2018-08-11 NOTE — Telephone Encounter (Signed)
Please let me know when pt calls back. Tried to call her this AM

## 2018-08-12 NOTE — Telephone Encounter (Signed)
Pt c/o some abdominal/pelvic pain and discharge from colpo and wondering if all this was normal. I advised pt all of this can be normal but to let me know if she starts to get fever or pain get worse. Pt also aware that SDJ will call her with results from colpo

## 2018-08-12 NOTE — Telephone Encounter (Signed)
Please let me know when pt calls back. Tried calling again this AM. If I cannot get to phone please see what her questions is and put in message so I know. Thank you

## 2018-08-17 ENCOUNTER — Telehealth: Payer: Self-pay | Admitting: Obstetrics and Gynecology

## 2018-08-17 NOTE — Telephone Encounter (Signed)
Discussed CIN 1 results with negative ECC with AGUS, HPV negative pap smear. Recommendation to repeat pap smear in one year with HPV testing.  Patient requests hysterectomy due to ongoing pelvic pain. She is concerned due to a family history of reproductive cancers.  She does have a history of pelvic pain and menorrhagia.  Will schedule for hysterectomy.

## 2018-08-18 ENCOUNTER — Telehealth: Payer: Self-pay | Admitting: Obstetrics and Gynecology

## 2018-08-18 NOTE — Telephone Encounter (Signed)
Patient is aware of H&P at [facility] on 08/26/18 @ 8:50am w/ Dr Glennon Mac, Pre-admit Testing afterwards, and OR on 09/01/18. Patient confirmed Medicaid.

## 2018-08-18 NOTE — Telephone Encounter (Signed)
-----   Message from Will Bonnet, MD sent at 08/17/2018  7:09 PM EDT ----- Regarding: Schedule surgery Surgery Booking Request Patient Full Name:  Denise Bryan  MRN: [id number]  DOB: 1986-07-24  Surgeon: Prentice Docker, MD  Requested Surgery Date and Time: TBD Primary Diagnosis AND Code: chronic pelvic pain, menorrhagia with irregular cycle Secondary Diagnosis and Code:  Surgical Procedure: Total laparoscopic hysterectomy, bilateral salpingectomy, cystoscopy L&D Notification: No Admission Status: observation Length of Surgery: 1.5 hours Special Case Needs: none H&P: TBD (date) Phone Interview???: no Interpreter: Language:  Medical Clearance: no Special Scheduling Instructions: none

## 2018-08-18 NOTE — Telephone Encounter (Signed)
Lmtrc

## 2018-08-19 ENCOUNTER — Telehealth: Payer: Self-pay | Admitting: Obstetrics and Gynecology

## 2018-08-19 NOTE — Telephone Encounter (Signed)
Patient is aware of H&P at [facility] rescheduled for 08/26/18 @ 11:30am w/ Dr Glennon Mac, due to no morning availability at [place] and being scheduled at 1:00pm there.

## 2018-08-26 ENCOUNTER — Encounter: Payer: Medicaid Other | Admitting: Obstetrics and Gynecology

## 2018-08-26 ENCOUNTER — Encounter: Payer: Self-pay | Admitting: Obstetrics and Gynecology

## 2018-08-26 ENCOUNTER — Encounter
Admission: RE | Admit: 2018-08-26 | Discharge: 2018-08-26 | Disposition: A | Discharge status: Home / Self Care | Payer: Medicaid Other | Source: Ambulatory Visit | Attending: Obstetrics and Gynecology | Admitting: Obstetrics and Gynecology

## 2018-08-26 ENCOUNTER — Other Ambulatory Visit: Payer: Self-pay

## 2018-08-26 ENCOUNTER — Ambulatory Visit (INDEPENDENT_AMBULATORY_CARE_PROVIDER_SITE_OTHER): Payer: Medicaid Other | Admitting: Obstetrics and Gynecology

## 2018-08-26 VITALS — BP 114/70 | Ht 64.0 in | Wt 132.0 lb

## 2018-08-26 DIAGNOSIS — N921 Excessive and frequent menstruation with irregular cycle: Secondary | ICD-10-CM

## 2018-08-26 DIAGNOSIS — Z01812 Encounter for preprocedural laboratory examination: Secondary | ICD-10-CM | POA: Insufficient documentation

## 2018-08-26 DIAGNOSIS — R102 Pelvic and perineal pain: Secondary | ICD-10-CM | POA: Diagnosis not present

## 2018-08-26 DIAGNOSIS — N941 Unspecified dyspareunia: Secondary | ICD-10-CM

## 2018-08-26 DIAGNOSIS — N809 Endometriosis, unspecified: Secondary | ICD-10-CM

## 2018-08-26 LAB — CBC
HEMATOCRIT: 47.9 % — AB (ref 36.0–46.0)
HEMOGLOBIN: 17.1 g/dL — AB (ref 12.0–15.0)
MCH: 32.4 pg (ref 26.0–34.0)
MCHC: 35.7 g/dL (ref 30.0–36.0)
MCV: 90.7 fL (ref 80.0–100.0)
Platelets: 251 10*3/uL (ref 150–400)
RBC: 5.28 MIL/uL — AB (ref 3.87–5.11)
RDW: 12 % (ref 11.5–15.5)
WBC: 7.1 10*3/uL (ref 4.0–10.5)
nRBC: 0 % (ref 0.0–0.2)

## 2018-08-26 LAB — COMPREHENSIVE METABOLIC PANEL
ALBUMIN: 3.7 g/dL (ref 3.5–5.0)
ALT: 16 U/L (ref 0–44)
AST: 15 U/L (ref 15–41)
Alkaline Phosphatase: 59 U/L (ref 38–126)
Anion gap: 8 (ref 5–15)
BILIRUBIN TOTAL: 0.8 mg/dL (ref 0.3–1.2)
BUN: 12 mg/dL (ref 6–20)
CO2: 27 mmol/L (ref 22–32)
Calcium: 8.7 mg/dL — ABNORMAL LOW (ref 8.9–10.3)
Chloride: 105 mmol/L (ref 98–111)
Creatinine, Ser: 0.6 mg/dL (ref 0.44–1.00)
GFR calc Af Amer: >60 mL/min (ref >60)
GFR calc non Af Amer: >60 mL/min (ref >60)
Glucose, Bld: 83 mg/dL (ref 70–99)
POTASSIUM: 4.3 mmol/L (ref 3.5–5.1)
Sodium: 140 mmol/L (ref 135–145)
Total Protein: 6.3 g/dL — ABNORMAL LOW (ref 6.5–8.1)

## 2018-08-26 LAB — TYPE AND SCREEN
ABO/RH(D): B NEG
Antibody Screen: NEGATIVE

## 2018-08-26 NOTE — H&P (View-Only) (Signed)
Preoperative History and Physical  Denise Bryan is a 32 y.o. [phone] here for surgical management of chronic pelvic pain and menorrhagia.   No significant preoperative concerns.  History of Present Illness: Her menses are twice monthly, lasting  Between 3 days to 2 weeks.  Dysmenorrhea severe, occurring throughout cycle. She has a long history of chronic pelvic pain, for which she has undergone two laparoscopies.  She has not had relief with laparoscopy as no obvious lesions have been noted, though she does have some findings consistent with endometriosis.  Multiple discussions have been had with this patient regarding management of the above and she elects hysterectomy as definitive treatment.  She understands that with pelvic pain hysterectomy may not be 100% curative.  She has had a recent abnormal Pap smear.  Her colposcopy showed CIN-1.  She has no other preoperative concerns.  Proposed surgery: Total laparoscopic hysterectomy, bilateral salpingectomy, cystoscopy  Past Medical History:  Diagnosis Date  . Endometriosis   . Family history of ovarian cancer   . Gastroesophageal reflux disease 09/20/2008   no problems since gallbladder taken out!! 2007  . Genetic testing of female 09/2016   TC=8.8% PER [facility]  . Genital herpes simplex 01/02/2015  . Headache    migraines, require a visit to the emergency room  . Spontaneous abortion   . Unspecified blood type, rh negative    Past Surgical History:  Procedure Laterality Date  . CHOLECYSTECTOMY  2007  . DIAGNOSTIC LAPAROSCOPY  04/26/2010   IUD EXPULSION/PELVIC PAIN  . DILATION AND CURETTAGE OF UTERUS  08/10/10; 08/06/07   HYDATIIDIFORM MOLE/POC, POC  . gallstones  sept 18, 2007  . HYSTEROSCOPY  04/26/2010   IUD EXPULSION /PELVIC PAIN  . LAPAROSCOPY N/A 08/21/2017   Procedure: LAPAROSCOPY DIAGNOSTIC;  Surgeon: Will Bonnet, MD;  Location: [facility] ORS;  Service: Gynecology;  Laterality: N/A;  . LAPAROSCOPY N/A 07/30/2018    Procedure: LAPAROSCOPY DIAGNOSTIC WITH BILATERAL SALPINGECTOMY;  Surgeon: Will Bonnet, MD;  Location: [facility] ORS;  Service: Gynecology;  Laterality: N/A;  . TUBAL LIGATION  01/2012   SDJ   OB History  Gravida Para Term Preterm AB Living  '4 3 3 '$ 0 1 3  SAB TAB Ectopic Multiple Live Births  1 0 0 0 3    # Outcome Date GA Lbr Len/2nd Weight Sex Delivery Anes PTL Lv  4 Term 01/21/12   6 lb 4 oz (2.835 kg) F Vag-Spont   LIV     Birth Comments: CHTN, PRETERM CERVICAL [place] SUSPECTED ATTEMPT AT [facility] BY PT.  IOL AT 39WKS FOR CERVICAL DILATION  3 Term 07/22/07   7 lb 3 oz (3.26 kg) M Vag-Spont   LIV  2 Term 06/26/06   7 lb 13 oz (3.544 kg) M Vag-Spont   LIV  1 SAB           Patient denies any other pertinent gynecologic issues.   Current Outpatient Medications on File Prior to Visit  Medication Sig Dispense Refill  . ibuprofen (ADVIL,MOTRIN) 800 MG tablet Take 800 mg by mouth every 6 (six) hours as needed (for pain.).     No current facility-administered medications on file prior to visit.    Allergies  Allergen Reactions  . Eicosapentaenoic Acid (Epa) Anaphylaxis  . Fish-Derived Products Anaphylaxis  . Iodine Anaphylaxis  . Tramadol Anaphylaxis    Hives, throat closing    Social History:   reports that she has never smoked. She has never used smokeless tobacco. She reports  that she does not drink alcohol or use drugs.  Family History  Problem Relation Age of Onset  . Diabetes Mother   . Hypertension Mother   . Cancer Mother 41       UTERINE  . Colon cancer Mother   . Cancer Sister 20       CX  . Diabetes Maternal Uncle   . Diabetes Maternal Grandmother   . Hypertension Maternal Grandmother   . Lung disease Maternal Grandfather   . Cancer Maternal Grandfather        LUNG  . Heart disease Paternal Grandmother   . Hypertension Paternal Grandmother     Review of Systems: Noncontributory  PHYSICAL EXAM: Blood pressure 114/70, height '5\' 4"'$  (1.626 m), weight 132 lb  (59.9 kg), unknown if currently breastfeeding. CONSTITUTIONAL: Well-developed, well-nourished female in no acute distress.  HENT:  Normocephalic, atraumatic, External right and left ear normal. Oropharynx is clear and moist EYES: Conjunctivae and EOM are normal. Pupils are equal, round, and reactive to light. No scleral icterus.  NECK: Normal range of motion, supple, no masses SKIN: Skin is warm and dry. No rash noted. Not diaphoretic. No erythema. No pallor. [place]: Alert and oriented to person, place, and time. Normal reflexes, muscle tone coordination. No cranial nerve deficit noted. PSYCHIATRIC: Normal mood and affect. Normal behavior. Normal judgment and thought content. CARDIOVASCULAR: Normal heart rate noted, regular rhythm RESPIRATORY: Effort and breath sounds normal, no problems with respiration noted ABDOMEN: Soft, nontender, nondistended. PELVIC: Deferred MUSCULOSKELETAL: Normal range of motion. No edema and no tenderness. 2+ distal pulses.  Labs: No results found for this or any previous visit (from the past 336 hour(s)).  Imaging Studies: No results found.  Assessment: Patient Active Problem List   Diagnosis Date Noted  . Endometriosis 07/02/2017  . Pelvic pain 06/23/2017  . Dyspareunia, female 06/23/2017  . Menorrhagia with irregular cycle 06/23/2017    Plan: Patient will undergo surgical management with the above-noted surgery.   The risks of surgery were discussed in detail with the patient including but not limited to: bleeding which may require transfusion or reoperation; infection which may require antibiotics; injury to surrounding organs which may involve bowel, bladder, ureters ; need for additional procedures including laparoscopy or laparotomy; thromboembolic phenomenon, surgical site problems and other postoperative/anesthesia complications. Likelihood of success in alleviating the patient's condition was discussed. Routine postoperative instructions will be  reviewed with the patient and her family in detail after surgery.  The patient concurred with the proposed plan, giving informed written consent for the surgery.  Preoperative prophylactic antibiotics, as indicated, and SCDs ordered on call to the OR.    Prentice Docker, MD 08/26/2018 11:53 AM

## 2018-08-26 NOTE — Patient Instructions (Signed)
Your procedure is scheduled on: Tuesday, September 01, 2018 Report to Day Surgery on the 2nd floor of the [facility]. To find out your arrival time, please call [phone] between 1PM - 3PM on: Monday, August 31, 2018  REMEMBER: Instructions that are not followed completely may result in serious medical risk, up to and including death; or upon the discretion of your surgeon and anesthesiologist your surgery may need to be rescheduled.  Do not eat food after midnight the night before surgery.  No gum chewing, lozengers or hard candies.  You may however, drink CLEAR liquids up to 2 hours before you are scheduled to arrive for your surgery. Do not drink anything within 2 hours of the start of your surgery.  Clear liquids include: - water  - apple juice without pulp - gatorade - black coffee or tea (Do NOT add milk or creamers to the coffee or tea) Do NOT drink anything that is not on this list.  No Alcohol for 24 hours before or after surgery.  No Smoking including e-cigarettes for 24 hours prior to surgery.  No chewable tobacco products for at least 6 hours prior to surgery.  No nicotine patches on the day of surgery.  On the morning of surgery brush your teeth with toothpaste and water, you may rinse your mouth with mouthwash if you wish. Do not swallow any toothpaste or mouthwash.  Notify your doctor if there is any change in your medical condition (cold, fever, infection).  Do not wear jewelry, make-up, hairpins, clips or nail polish.  Do not wear lotions, powders, or perfumes. You may wear deodorant.  Do not shave 48 hours prior to surgery.   Contacts and dentures may not be worn into surgery.  Do not bring valuables to the hospital, including drivers license, insurance or credit cards.  [place] is not responsible for any belongings or valuables.   TAKE THESE MEDICATIONS THE MORNING OF SURGERY:  none  Use CHG Soap as directed on instruction sheet.  NOW!  Stop  Anti-inflammatories (NSAIDS) such as Advil, Aleve, Ibuprofen, Motrin, Naproxen, Naprosyn and Aspirin based products such as Excedrin, Goodys Powder, BC Powder. (May take Tylenol or Acetaminophen if needed.)  NOW!  Stop ANY OVER THE COUNTER supplements until after surgery.  Wear comfortable clothing (specific to your surgery type) to the hospital.  Plan for stool softeners for home use.  If you are being admitted to the hospital overnight, leave your suitcase in the car. After surgery it may be brought to your room.  Please call [phone] if you have any questions about these instructions.

## 2018-08-26 NOTE — Progress Notes (Signed)
Preoperative History and Physical  Denise Bryan is a 32 y.o. [phone] here for surgical management of chronic pelvic pain and menorrhagia.   No significant preoperative concerns.  History of Present Illness: Her menses are twice monthly, lasting  Between 3 days to 2 weeks.  Dysmenorrhea severe, occurring throughout cycle. She has a long history of chronic pelvic pain, for which she has undergone two laparoscopies.  She has not had relief with laparoscopy as no obvious lesions have been noted, though she does have some findings consistent with endometriosis.  Multiple discussions have been had with this patient regarding management of the above and she elects hysterectomy as definitive treatment.  She understands that with pelvic pain hysterectomy may not be 100% curative.  She has had a recent abnormal Pap smear.  Her colposcopy showed CIN-1.  She has no other preoperative concerns.  Proposed surgery: Total laparoscopic hysterectomy, bilateral salpingectomy, cystoscopy  Past Medical History:  Diagnosis Date  . Endometriosis   . Family history of ovarian cancer   . Gastroesophageal reflux disease 09/20/2008   no problems since gallbladder taken out!! 2007  . Genetic testing of female 09/2016   TC=8.8% PER [facility]  . Genital herpes simplex 01/02/2015  . Headache    migraines, require a visit to the emergency room  . Spontaneous abortion   . Unspecified blood type, rh negative    Past Surgical History:  Procedure Laterality Date  . CHOLECYSTECTOMY  2007  . DIAGNOSTIC LAPAROSCOPY  04/26/2010   IUD EXPULSION/PELVIC PAIN  . DILATION AND CURETTAGE OF UTERUS  08/10/10; 08/06/07   HYDATIIDIFORM MOLE/POC, POC  . gallstones  sept 18, 2007  . HYSTEROSCOPY  04/26/2010   IUD EXPULSION /PELVIC PAIN  . LAPAROSCOPY N/A 08/21/2017   Procedure: LAPAROSCOPY DIAGNOSTIC;  Surgeon: Will Bonnet, MD;  Location: [facility] ORS;  Service: Gynecology;  Laterality: N/A;  . LAPAROSCOPY N/A 07/30/2018    Procedure: LAPAROSCOPY DIAGNOSTIC WITH BILATERAL SALPINGECTOMY;  Surgeon: Will Bonnet, MD;  Location: [facility] ORS;  Service: Gynecology;  Laterality: N/A;  . TUBAL LIGATION  01/2012   SDJ   OB History  Gravida Para Term Preterm AB Living  '4 3 3 '$ 0 1 3  SAB TAB Ectopic Multiple Live Births  1 0 0 0 3    # Outcome Date GA Lbr Len/2nd Weight Sex Delivery Anes PTL Lv  4 Term 01/21/12   6 lb 4 oz (2.835 kg) F Vag-Spont   LIV     Birth Comments: CHTN, PRETERM CERVICAL [place] SUSPECTED ATTEMPT AT [facility] BY PT.  IOL AT 39WKS FOR CERVICAL DILATION  3 Term 07/22/07   7 lb 3 oz (3.26 kg) M Vag-Spont   LIV  2 Term 06/26/06   7 lb 13 oz (3.544 kg) M Vag-Spont   LIV  1 SAB           Patient denies any other pertinent gynecologic issues.   Current Outpatient Medications on File Prior to Visit  Medication Sig Dispense Refill  . ibuprofen (ADVIL,MOTRIN) 800 MG tablet Take 800 mg by mouth every 6 (six) hours as needed (for pain.).     No current facility-administered medications on file prior to visit.    Allergies  Allergen Reactions  . Eicosapentaenoic Acid (Epa) Anaphylaxis  . Fish-Derived Products Anaphylaxis  . Iodine Anaphylaxis  . Tramadol Anaphylaxis    Hives, throat closing    Social History:   reports that she has never smoked. She has never used smokeless tobacco. She reports  that she does not drink alcohol or use drugs.  Family History  Problem Relation Age of Onset  . Diabetes Mother   . Hypertension Mother   . Cancer Mother 8       UTERINE  . Colon cancer Mother   . Cancer Sister 20       CX  . Diabetes Maternal Uncle   . Diabetes Maternal Grandmother   . Hypertension Maternal Grandmother   . Lung disease Maternal Grandfather   . Cancer Maternal Grandfather        LUNG  . Heart disease Paternal Grandmother   . Hypertension Paternal Grandmother     Review of Systems: Noncontributory  PHYSICAL EXAM: Blood pressure 114/70, height '5\' 4"'$  (1.626 m), weight 132 lb  (59.9 kg), unknown if currently breastfeeding. CONSTITUTIONAL: Well-developed, well-nourished female in no acute distress.  HENT:  Normocephalic, atraumatic, External right and left ear normal. Oropharynx is clear and moist EYES: Conjunctivae and EOM are normal. Pupils are equal, round, and reactive to light. No scleral icterus.  NECK: Normal range of motion, supple, no masses SKIN: Skin is warm and dry. No rash noted. Not diaphoretic. No erythema. No pallor. [place]: Alert and oriented to person, place, and time. Normal reflexes, muscle tone coordination. No cranial nerve deficit noted. PSYCHIATRIC: Normal mood and affect. Normal behavior. Normal judgment and thought content. CARDIOVASCULAR: Normal heart rate noted, regular rhythm RESPIRATORY: Effort and breath sounds normal, no problems with respiration noted ABDOMEN: Soft, nontender, nondistended. PELVIC: Deferred MUSCULOSKELETAL: Normal range of motion. No edema and no tenderness. 2+ distal pulses.  Labs: No results found for this or any previous visit (from the past 336 hour(s)).  Imaging Studies: No results found.  Assessment: Patient Active Problem List   Diagnosis Date Noted  . Endometriosis 07/02/2017  . Pelvic pain 06/23/2017  . Dyspareunia, female 06/23/2017  . Menorrhagia with irregular cycle 06/23/2017    Plan: Patient will undergo surgical management with the above-noted surgery.   The risks of surgery were discussed in detail with the patient including but not limited to: bleeding which may require transfusion or reoperation; infection which may require antibiotics; injury to surrounding organs which may involve bowel, bladder, ureters ; need for additional procedures including laparoscopy or laparotomy; thromboembolic phenomenon, surgical site problems and other postoperative/anesthesia complications. Likelihood of success in alleviating the patient's condition was discussed. Routine postoperative instructions will be  reviewed with the patient and her family in detail after surgery.  The patient concurred with the proposed plan, giving informed written consent for the surgery.  Preoperative prophylactic antibiotics, as indicated, and SCDs ordered on call to the OR.    Prentice Docker, MD 08/26/2018 11:53 AM

## 2018-08-31 MED ORDER — CEFAZOLIN SODIUM-DEXTROSE 2-4 GM/100ML-% IV SOLN
2.0000 g | INTRAVENOUS | Status: AC
  Administered 2018-09-01: 2 g via INTRAVENOUS

## 2018-09-01 ENCOUNTER — Observation Stay: Payer: Medicaid Other | Admitting: Certified Registered"

## 2018-09-01 ENCOUNTER — Other Ambulatory Visit: Payer: Self-pay

## 2018-09-01 ENCOUNTER — Encounter: Payer: Self-pay | Admitting: *Deleted

## 2018-09-01 ENCOUNTER — Encounter
Admission: RE | Disposition: A | Discharge status: Home / Self Care | Payer: Self-pay | Source: Ambulatory Visit | Attending: Obstetrics and Gynecology

## 2018-09-01 ENCOUNTER — Observation Stay
Admission: RE | Admit: 2018-09-01 | Discharge: 2018-09-02 | Disposition: A | Discharge status: Home / Self Care | Payer: Medicaid Other | Source: Ambulatory Visit | Attending: Obstetrics and Gynecology | Admitting: Obstetrics and Gynecology

## 2018-09-01 DIAGNOSIS — G8929 Other chronic pain: Secondary | ICD-10-CM | POA: Diagnosis not present

## 2018-09-01 DIAGNOSIS — N809 Endometriosis, unspecified: Secondary | ICD-10-CM | POA: Diagnosis present

## 2018-09-01 DIAGNOSIS — N920 Excessive and frequent menstruation with regular cycle: Secondary | ICD-10-CM | POA: Insufficient documentation

## 2018-09-01 DIAGNOSIS — N946 Dysmenorrhea, unspecified: Secondary | ICD-10-CM | POA: Insufficient documentation

## 2018-09-01 DIAGNOSIS — R102 Pelvic and perineal pain: Secondary | ICD-10-CM | POA: Insufficient documentation

## 2018-09-01 DIAGNOSIS — K219 Gastro-esophageal reflux disease without esophagitis: Secondary | ICD-10-CM | POA: Insufficient documentation

## 2018-09-01 DIAGNOSIS — N800 Endometriosis of uterus: Secondary | ICD-10-CM | POA: Insufficient documentation

## 2018-09-01 DIAGNOSIS — N870 Mild cervical dysplasia: Secondary | ICD-10-CM | POA: Diagnosis present

## 2018-09-01 DIAGNOSIS — Z888 Allergy status to other drugs, medicaments and biological substances status: Secondary | ICD-10-CM | POA: Diagnosis not present

## 2018-09-01 DIAGNOSIS — Z90710 Acquired absence of both cervix and uterus: Secondary | ICD-10-CM | POA: Diagnosis present

## 2018-09-01 DIAGNOSIS — N921 Excessive and frequent menstruation with irregular cycle: Secondary | ICD-10-CM | POA: Diagnosis not present

## 2018-09-01 HISTORY — PX: CYSTOSCOPY: SHX5120

## 2018-09-01 HISTORY — PX: LAPAROSCOPIC HYSTERECTOMY: SHX1926

## 2018-09-01 LAB — CBC
HCT: 36.5 % (ref 36.0–46.0)
Hemoglobin: 12.9 g/dL (ref 12.0–15.0)
MCH: 32.7 pg (ref 26.0–34.0)
MCHC: 35.3 g/dL (ref 30.0–36.0)
MCV: 92.6 fL (ref 80.0–100.0)
PLATELETS: 227 10*3/uL (ref 150–400)
RBC: 3.94 MIL/uL (ref 3.87–5.11)
RDW: 12 % (ref 11.5–15.5)
WBC: 12.6 10*3/uL — AB (ref 4.0–10.5)
nRBC: 0 % (ref 0.0–0.2)

## 2018-09-01 LAB — ABO/RH: ABO/RH(D): B NEG

## 2018-09-01 LAB — BASIC METABOLIC PANEL
Anion gap: 5 (ref 5–15)
BUN: 7 mg/dL (ref 6–20)
CALCIUM: 8.5 mg/dL — AB (ref 8.9–10.3)
CO2: 25 mmol/L (ref 22–32)
Chloride: 107 mmol/L (ref 98–111)
Creatinine, Ser: 0.55 mg/dL (ref 0.44–1.00)
GFR calc Af Amer: >60 mL/min (ref >60)
Glucose, Bld: 164 mg/dL — ABNORMAL HIGH (ref 70–99)
Potassium: 3.9 mmol/L (ref 3.5–5.1)
Sodium: 137 mmol/L (ref 135–145)

## 2018-09-01 LAB — POCT PREGNANCY, URINE: Preg Test, Ur: NEGATIVE

## 2018-09-01 SURGERY — HYSTERECTOMY, TOTAL, LAPAROSCOPIC
Anesthesia: General

## 2018-09-01 MED ORDER — DOCUSATE SODIUM 100 MG PO CAPS
100.0000 mg | ORAL_CAPSULE | Freq: Two times a day (BID) | ORAL | Status: DC
  Administered 2018-09-02 (×2): 100 mg via ORAL
  Filled 2018-09-01 (×2): qty 1

## 2018-09-01 MED ORDER — ONDANSETRON HCL 4 MG/2ML IJ SOLN
INTRAMUSCULAR | Status: AC
  Filled 2018-09-01: qty 2

## 2018-09-01 MED ORDER — FLUORESCEIN SODIUM 10 % IV SOLN
INTRAVENOUS | Status: AC
  Filled 2018-09-01: qty 5

## 2018-09-01 MED ORDER — PROPOFOL 10 MG/ML IV BOLUS
INTRAVENOUS | Status: DC | PRN
  Administered 2018-09-01: 120 mg via INTRAVENOUS

## 2018-09-01 MED ORDER — KETOROLAC TROMETHAMINE 30 MG/ML IJ SOLN
15.0000 mg | Freq: Four times a day (QID) | INTRAMUSCULAR | Status: DC
  Administered 2018-09-02: 15 mg via INTRAVENOUS
  Filled 2018-09-01: qty 1

## 2018-09-01 MED ORDER — OXYCODONE-ACETAMINOPHEN 5-325 MG PO TABS: 1.0000 | ORAL_TABLET | ORAL | Status: DC | PRN

## 2018-09-01 MED ORDER — FENTANYL CITRATE (PF) 100 MCG/2ML IJ SOLN
INTRAMUSCULAR | Status: AC
  Administered 2018-09-01: 25 ug via INTRAVENOUS
  Filled 2018-09-01: qty 2

## 2018-09-01 MED ORDER — EPHEDRINE SULFATE 50 MG/ML IJ SOLN
INTRAMUSCULAR | Status: DC | PRN
  Administered 2018-09-01: 10 mg via INTRAVENOUS

## 2018-09-01 MED ORDER — BUPIVACAINE HCL (PF) 0.5 % IJ SOLN
INTRAMUSCULAR | Status: AC
  Filled 2018-09-01: qty 30

## 2018-09-01 MED ORDER — KETOROLAC TROMETHAMINE 30 MG/ML IJ SOLN
30.0000 mg | Freq: Once | INTRAMUSCULAR | Status: AC
  Administered 2018-09-02: 30 mg via INTRAVENOUS
  Filled 2018-09-01: qty 1

## 2018-09-01 MED ORDER — LIDOCAINE HCL (CARDIAC) PF 100 MG/5ML IV SOSY
PREFILLED_SYRINGE | INTRAVENOUS | Status: DC | PRN
  Administered 2018-09-01: 100 mg via INTRAVENOUS

## 2018-09-01 MED ORDER — LACTATED RINGERS IV SOLN
INTRAVENOUS | Status: DC
  Administered 2018-09-01 – 2018-09-02 (×4): via INTRAVENOUS

## 2018-09-01 MED ORDER — IBUPROFEN 600 MG PO TABS
600.0000 mg | ORAL_TABLET | Freq: Four times a day (QID) | ORAL | Status: DC | PRN
  Administered 2018-09-01 (×2): 600 mg via ORAL
  Filled 2018-09-01 (×2): qty 1

## 2018-09-01 MED ORDER — PHENYLEPHRINE HCL 10 MG/ML IJ SOLN
INTRAMUSCULAR | Status: DC | PRN
  Administered 2018-09-01: 100 ug via INTRAVENOUS

## 2018-09-01 MED ORDER — DEXAMETHASONE SODIUM PHOSPHATE 10 MG/ML IJ SOLN
INTRAMUSCULAR | Status: AC
  Filled 2018-09-01: qty 1

## 2018-09-01 MED ORDER — LACTATED RINGERS IV SOLN
INTRAVENOUS | Status: DC
  Administered 2018-09-01 (×2): via INTRAVENOUS

## 2018-09-01 MED ORDER — ONDANSETRON HCL 4 MG/2ML IJ SOLN
INTRAMUSCULAR | Status: DC | PRN
  Administered 2018-09-01: 4 mg via INTRAVENOUS

## 2018-09-01 MED ORDER — FAMOTIDINE 20 MG PO TABS
ORAL_TABLET | ORAL | Status: AC
  Administered 2018-09-01: 20 mg via ORAL
  Filled 2018-09-01: qty 1

## 2018-09-01 MED ORDER — ACETAMINOPHEN 10 MG/ML IV SOLN
INTRAVENOUS | Status: DC | PRN
  Administered 2018-09-01: 1000 mg via INTRAVENOUS

## 2018-09-01 MED ORDER — FENTANYL CITRATE (PF) 100 MCG/2ML IJ SOLN
INTRAMUSCULAR | Status: AC
  Filled 2018-09-01: qty 2

## 2018-09-01 MED ORDER — SIMETHICONE 80 MG PO CHEW
80.0000 mg | CHEWABLE_TABLET | Freq: Four times a day (QID) | ORAL | Status: DC | PRN
  Administered 2018-09-01 – 2018-09-02 (×3): 80 mg via ORAL
  Filled 2018-09-01 (×3): qty 1

## 2018-09-01 MED ORDER — BUPIVACAINE HCL 0.5 % IJ SOLN
INTRAMUSCULAR | Status: DC | PRN
  Administered 2018-09-01: 10 mL

## 2018-09-01 MED ORDER — ROCURONIUM BROMIDE 100 MG/10ML IV SOLN
INTRAVENOUS | Status: DC | PRN
  Administered 2018-09-01: 10 mg via INTRAVENOUS
  Administered 2018-09-01: 40 mg via INTRAVENOUS

## 2018-09-01 MED ORDER — HYDROMORPHONE HCL 1 MG/ML IJ SOLN
1.0000 mg | INTRAMUSCULAR | Status: AC
  Administered 2018-09-01: 1 mg via INTRAVENOUS
  Filled 2018-09-01: qty 1

## 2018-09-01 MED ORDER — HYDROCODONE-ACETAMINOPHEN 5-325 MG PO TABS: 1.0000 | ORAL_TABLET | ORAL | Status: DC | PRN

## 2018-09-01 MED ORDER — ONDANSETRON HCL 4 MG/2ML IJ SOLN: 4.0000 mg | Freq: Once | INTRAMUSCULAR | Status: DC | PRN

## 2018-09-01 MED ORDER — HYDROCODONE-ACETAMINOPHEN 5-325 MG PO TABS
2.0000 | ORAL_TABLET | ORAL | Status: DC | PRN
  Administered 2018-09-02 (×4): 2 via ORAL
  Filled 2018-09-01 (×4): qty 2

## 2018-09-01 MED ORDER — LIDOCAINE HCL (PF) 2 % IJ SOLN
INTRAMUSCULAR | Status: AC
  Filled 2018-09-01: qty 10

## 2018-09-01 MED ORDER — CEFAZOLIN SODIUM-DEXTROSE 2-4 GM/100ML-% IV SOLN
INTRAVENOUS | Status: AC
  Filled 2018-09-01: qty 100

## 2018-09-01 MED ORDER — MIDAZOLAM HCL 2 MG/2ML IJ SOLN
INTRAMUSCULAR | Status: DC | PRN
  Administered 2018-09-01: 2 mg via INTRAVENOUS

## 2018-09-01 MED ORDER — OXYCODONE-ACETAMINOPHEN 5-325 MG PO TABS
2.0000 | ORAL_TABLET | ORAL | Status: DC | PRN
  Administered 2018-09-01 (×3): 2 via ORAL
  Filled 2018-09-01 (×3): qty 2

## 2018-09-01 MED ORDER — LACTATED RINGERS IV BOLUS
1000.0000 mL | Freq: Once | INTRAVENOUS | Status: AC
  Administered 2018-09-01: 1000 mL via INTRAVENOUS

## 2018-09-01 MED ORDER — SEVOFLURANE IN SOLN
RESPIRATORY_TRACT | Status: AC
  Filled 2018-09-01: qty 250

## 2018-09-01 MED ORDER — MIDAZOLAM HCL 2 MG/2ML IJ SOLN
INTRAMUSCULAR | Status: AC
  Filled 2018-09-01: qty 2

## 2018-09-01 MED ORDER — FENTANYL CITRATE (PF) 100 MCG/2ML IJ SOLN
INTRAMUSCULAR | Status: DC | PRN
  Administered 2018-09-01 (×4): 50 ug via INTRAVENOUS

## 2018-09-01 MED ORDER — ROCURONIUM BROMIDE 50 MG/5ML IV SOLN
INTRAVENOUS | Status: AC
  Filled 2018-09-01: qty 1

## 2018-09-01 MED ORDER — FAMOTIDINE 20 MG PO TABS
20.0000 mg | ORAL_TABLET | Freq: Once | ORAL | Status: AC
  Administered 2018-09-01: 20 mg via ORAL

## 2018-09-01 MED ORDER — ONDANSETRON HCL 4 MG PO TABS
4.0000 mg | ORAL_TABLET | Freq: Four times a day (QID) | ORAL | Status: DC | PRN
  Administered 2018-09-01: 4 mg via ORAL
  Filled 2018-09-01: qty 1

## 2018-09-01 MED ORDER — ACETAMINOPHEN NICU IV SYRINGE 10 MG/ML
INTRAVENOUS | Status: AC
  Filled 2018-09-01: qty 1

## 2018-09-01 MED ORDER — ONDANSETRON HCL 4 MG/2ML IJ SOLN: 4.0000 mg | Freq: Four times a day (QID) | INTRAMUSCULAR | Status: DC | PRN

## 2018-09-01 MED ORDER — SUGAMMADEX SODIUM 200 MG/2ML IV SOLN
INTRAVENOUS | Status: DC | PRN
  Administered 2018-09-01: 200 mg via INTRAVENOUS

## 2018-09-01 MED ORDER — DEXAMETHASONE SODIUM PHOSPHATE 10 MG/ML IJ SOLN
INTRAMUSCULAR | Status: DC | PRN
  Administered 2018-09-01: 10 mg via INTRAVENOUS

## 2018-09-01 MED ORDER — FENTANYL CITRATE (PF) 100 MCG/2ML IJ SOLN
25.0000 ug | INTRAMUSCULAR | Status: DC | PRN
  Administered 2018-09-01 (×5): 25 ug via INTRAVENOUS

## 2018-09-01 MED ORDER — HYDROMORPHONE HCL 1 MG/ML IJ SOLN
0.5000 mg | INTRAMUSCULAR | Status: AC
  Administered 2018-09-01: 0.5 mg via INTRAVENOUS
  Filled 2018-09-01: qty 1

## 2018-09-01 MED ORDER — PROPOFOL 10 MG/ML IV BOLUS
INTRAVENOUS | Status: AC
  Filled 2018-09-01: qty 20

## 2018-09-01 MED ORDER — MENTHOL 3 MG MT LOZG
1.0000 | LOZENGE | OROMUCOSAL | Status: DC | PRN
  Filled 2018-09-01: qty 9

## 2018-09-01 SURGICAL SUPPLY — 60 items
APPLICATOR ARISTA FLEXITIP XL (MISCELLANEOUS) ×1 IMPLANT
BAG URINE DRAINAGE (UROLOGICAL SUPPLIES) ×2 IMPLANT
BLADE SURG SZ11 CARB STEEL (BLADE) ×2 IMPLANT
CATH FOLEY 2WAY  5CC 16FR (CATHETERS) ×1
CATH ROBINSON RED A/P 16FR (CATHETERS) ×2 IMPLANT
CATH URTH 16FR FL 2W BLN LF (CATHETERS) ×1 IMPLANT
CHLORAPREP W/TINT 26ML (MISCELLANEOUS) ×2 IMPLANT
COVER WAND RF STERILE (DRAPES) ×2 IMPLANT
DEFOGGER SCOPE WARMER CLEARIFY (MISCELLANEOUS) ×2 IMPLANT
DERMABOND ADVANCED (GAUZE/BANDAGES/DRESSINGS) ×1
DERMABOND ADVANCED .7 DNX12 (GAUZE/BANDAGES/DRESSINGS) ×1 IMPLANT
DEVICE SUTURE ENDOST 10MM (ENDOMECHANICALS) ×2 IMPLANT
DRAPE LEGGINS SURG 28X43 STRL (DRAPES) ×2 IMPLANT
DRAPE SHEET LG 3/4 BI-LAMINATE (DRAPES) ×2 IMPLANT
DRAPE UNDER BUTTOCK W/FLU (DRAPES) ×2 IMPLANT
GAUZE 4X4 16PLY RFD (DISPOSABLE) ×2 IMPLANT
GLOVE BIO SURGEON STRL SZ7 (GLOVE) ×6 IMPLANT
GLOVE BIOGEL PI IND STRL 7.5 (GLOVE) ×1 IMPLANT
GLOVE BIOGEL PI INDICATOR 7.5 (GLOVE) ×1
GLOVE INDICATOR 7.5 STRL GRN (GLOVE) ×11 IMPLANT
GOWN STRL REUS W/ TWL LRG LVL3 (GOWN DISPOSABLE) ×3 IMPLANT
GOWN STRL REUS W/ TWL XL LVL3 (GOWN DISPOSABLE) ×1 IMPLANT
GOWN STRL REUS W/TWL LRG LVL3 (GOWN DISPOSABLE) ×4
GOWN STRL REUS W/TWL XL LVL3 (GOWN DISPOSABLE) ×1
GRASPER SUT TROCAR 14GX15 (MISCELLANEOUS) ×2 IMPLANT
HEMOSTAT ARISTA ABSORB 3G PWDR (MISCELLANEOUS) ×1 IMPLANT
IRRIGATION STRYKERFLOW (MISCELLANEOUS) ×1 IMPLANT
IRRIGATOR STRYKERFLOW (MISCELLANEOUS) ×2
IV LACTATED RINGERS 1000ML (IV SOLUTION) ×4 IMPLANT
KIT PINK PAD W/HEAD ARE REST (MISCELLANEOUS) ×2
KIT PINK PAD W/HEAD ARM REST (MISCELLANEOUS) ×1 IMPLANT
KIT TURNOVER CYSTO (KITS) ×2 IMPLANT
LABEL OR SOLS (LABEL) ×2 IMPLANT
LIGASURE VESSEL 5MM BLUNT TIP (ELECTROSURGICAL) ×1 IMPLANT
MANIPULATOR VCARE LG CRV RETR (MISCELLANEOUS) ×1 IMPLANT
MANIPULATOR VCARE SML CRV RETR (MISCELLANEOUS) IMPLANT
MANIPULATOR VCARE STD CRV RETR (MISCELLANEOUS) IMPLANT
NEEDLE HYPO 22GX1.5 SAFETY (NEEDLE) ×2 IMPLANT
OCCLUDER COLPOPNEUMO (BALLOONS) ×2 IMPLANT
PACK LAP CHOLECYSTECTOMY (MISCELLANEOUS) ×2 IMPLANT
PAD OB MATERNITY 4.3X12.25 (PERSONAL CARE ITEMS) ×2 IMPLANT
PAD PREP 24X41 OB/GYN DISP (PERSONAL CARE ITEMS) ×2 IMPLANT
PORT ACCESS TROCAR AIRSEAL 5 (TROCAR) ×1 IMPLANT
SCISSORS METZENBAUM CVD 33 (INSTRUMENTS) ×2 IMPLANT
SET CYSTO W/LG BORE CLAMP LF (SET/KITS/TRAYS/PACK) ×2 IMPLANT
SET TRI-LUMEN FLTR TB AIRSEAL (TUBING) ×1 IMPLANT
SLEEVE ENDOPATH XCEL 5M (ENDOMECHANICALS) ×2 IMPLANT
SOL PREP PVP 2OZ (MISCELLANEOUS) ×2
SOLUTION PREP PVP 2OZ (MISCELLANEOUS) ×1 IMPLANT
SURGILUBE 2OZ TUBE FLIPTOP (MISCELLANEOUS) ×2 IMPLANT
SUT ENDO VLOC 180-0-8IN (SUTURE) ×3 IMPLANT
SUT MNCRL 4-0 (SUTURE) ×1
SUT MNCRL 4-0 27XMFL (SUTURE) ×1
SUT VIC AB 0 CT1 36 (SUTURE) ×2 IMPLANT
SUTURE MNCRL 4-0 27XMF (SUTURE) ×1 IMPLANT
SYR 10ML LL (SYRINGE) ×4 IMPLANT
SYR 50ML LL SCALE MARK (SYRINGE) ×2 IMPLANT
TROCAR ENDO BLADELESS 11MM (ENDOMECHANICALS) ×2 IMPLANT
TROCAR XCEL NON-BLD 5MMX100MML (ENDOMECHANICALS) ×1 IMPLANT
TUBING INSUF HEATED (TUBING) ×1 IMPLANT

## 2018-09-01 NOTE — Progress Notes (Signed)
RN gave pain medication at [place], RN left the room while tech took VS and then returned with blankets to make pt more comfortable. Pt then stated she was lightheaded and seeing stars while sitting. Pt also stated she was lightheaded during her first trip to the bathroom earlier today. Dr. Glennon Mac notified.

## 2018-09-01 NOTE — Interval H&P Note (Signed)
History and Physical Interval Note:  09/01/2018 7:32 AM  Denise Bryan  has presented today for surgery, with the diagnosis of chronic pelvic pain, menorrhagia with irregular cycle  The various methods of treatment have been discussed with the patient and family. After consideration of risks, benefits and other options for treatment, the patient has consented to  Procedure(s): HYSTERECTOMY TOTAL LAPAROSCOPIC BILATERAL SALPINGECTOMY (N/A) CYSTOSCOPY (N/A) as a surgical intervention .  The patient's history has been reviewed, patient examined, no change in status, stable for surgery.  I have reviewed the patient's chart and labs.  Questions were answered to the patient's satisfaction.    Prentice Docker, MD, [facility], [facility] 09/01/2018 7:32 AM

## 2018-09-01 NOTE — Progress Notes (Signed)
Post op check  Subjective: patient reports feeling lightheaded and seeing stars.  She states this started when she went to the bathroom at 450PM.  It has not resolved with lying in bed. She denies chest pain, shortness of breath. Her pain is moderately well controlled on PO pain medication. She is tolerating a small amount of PO, but states that she has some nausea.  She has had no emesis.  Objective: BP (!) 89/68 (BP Location: Left Arm)   Pulse 81   Temp 97.8 F (36.6 C) (Oral)   Resp 14   Ht 5\' 4"  (1.626 m)   Wt 59.9 kg   LMP 08/18/2018 Comment: preg test negative  SpO2 97%   BMI 22.67 kg/m   GEN: NAD Pulm: CTAB CV: RRR Abd; Soft, appropriately ttp, +BS, non-distended  Incision: all c/d/i Ext: no e/c/t  A/P: 32 y.o. female POD#0 s/p TLH/BS overall doing well.  She reports similar symptoms after her diagnostic laparoscopy about 1 month ago. She was taking percocet at the time, as well.  She recovered well from the issue at the time.  - CBC STAT - Exam findings reassuring. -UOP adequate, continue to monitor. - consider IVF bolus.  Her BPs are a little soft, but her UOP is adequate and her pulse is normal.   - consider changing pain medication to see if percocet is causing her symptoms. - monitor closely - OOB only with assistance tonight.  Prentice Docker, MD, [facility], [facility] 09/01/2018 8:33 PM

## 2018-09-01 NOTE — Anesthesia Post-op Follow-up Note (Signed)
Anesthesia QCDR form completed.        

## 2018-09-01 NOTE — Anesthesia Preprocedure Evaluation (Signed)
Anesthesia Evaluation  Patient identified by MRN, date of birth, ID band Patient awake    Reviewed: Allergy & Precautions, NPO status , Patient's Chart, lab work & pertinent test results, reviewed documented beta blocker date and time   Airway Mallampati: II  TM Distance: >3 FB     Dental  (+) Chipped   Pulmonary neg pulmonary ROS,    Pulmonary exam normal        Cardiovascular negative cardio ROS Normal cardiovascular exam     Neuro/Psych  Headaches, negative psych ROS   GI/Hepatic Neg liver ROS, GERD  Controlled,  Endo/Other  negative endocrine ROS  Renal/GU negative Renal ROS  Female GU complaint     Musculoskeletal negative musculoskeletal ROS (+)   Abdominal   Peds negative pediatric ROS (+)  Hematology   Anesthesia Other Findings   Reproductive/Obstetrics                             Anesthesia Physical  Anesthesia Plan  ASA: II  Anesthesia Plan: General   Post-op Pain Management:    Induction: Intravenous  PONV Risk Score and Plan:   Airway Management Planned: Oral ETT  Additional Equipment:   Intra-op Plan:   Post-operative Plan: Extubation in OR  Informed Consent: I have reviewed the patients History and Physical, chart, labs and discussed the procedure including the risks, benefits and alternatives for the proposed anesthesia with the patient or authorized representative who has indicated his/her understanding and acceptance.     Plan Discussed with: CRNA  Anesthesia Plan Comments:         Anesthesia Quick Evaluation

## 2018-09-01 NOTE — Transfer of Care (Signed)
Immediate Anesthesia Transfer of Care Note  Patient: Denise Bryan  Procedure(s) Performed: HYSTERECTOMY TOTAL LAPAROSCOPIC BILATERAL SALPINGECTOMY (N/A ) CYSTOSCOPY (N/A )  Patient Location: PACU  Anesthesia Type:General  Level of Consciousness: awake, alert  and oriented  Airway & Oxygen Therapy: Patient Spontanous Breathing and Patient connected to face mask oxygen  Post-op Assessment: Report given to RN  Post vital signs: Reviewed and stable  Last Vitals:  Vitals Value Taken Time  BP 107/60 09/01/2018 11:15 AM  Temp 36.1 C 09/01/2018 11:15 AM  Pulse 73 09/01/2018 11:15 AM  Resp 14 09/01/2018 11:15 AM  SpO2 99 % 09/01/2018 11:15 AM  Vitals shown include unvalidated device data.  Last Pain:  Vitals:   09/01/18 1115  TempSrc:   PainSc: Asleep         Complications: No apparent anesthesia complications

## 2018-09-01 NOTE — Anesthesia Procedure Notes (Signed)
Procedure Name: Intubation Date/Time: 09/01/2018 7:50 AM Performed by: Philbert Riser, CRNA Pre-anesthesia Checklist: Patient identified, Emergency Drugs available, Suction available, Patient being monitored and Timeout performed Patient Re-evaluated:Patient Re-evaluated prior to induction Oxygen Delivery Method: Circle system utilized and Simple face mask Preoxygenation: Pre-oxygenation with 100% oxygen Induction Type: IV induction Ventilation: Mask ventilation without difficulty Laryngoscope Size: Mac and 3 Grade View: Grade I Tube type: Oral Tube size: 7.0 mm Number of attempts: 1 Airway Equipment and Method: Stylet Placement Confirmation: ETT inserted through vocal cords under direct vision,  positive ETCO2 and breath sounds checked- equal and bilateral Secured at: 20 cm Tube secured with: Tape Dental Injury: Teeth and Oropharynx as per pre-operative assessment

## 2018-09-01 NOTE — Op Note (Signed)
Operative Note    Pre-Op Diagnosis:  1) menorrhagia with irregular cycle (Abnormal uterine bleeding) 2) chronic pelvic pain in female  Post-Op Diagnosis:  1) menorrhagia with irregular cycle (Abnormal uterine bleeding) 2) chronic pelvic pain in female  Procedures:  1. Total laparoscopic hysterectomy 2. cystoscopy  Primary Surgeon: Prentice Docker, MD   Assistant Surgeon: Adrian Prows, MD: ; No other capable assistant available, in surgery requiring high level assistant.  EBL: 100 mL   IVF: 1,200 mL   Urine output: 300 mL clear urine at end of procedure  Specimens: uterus with cervix, proximal portion of fallopian tubes  Drains: none  Complications: None   Disposition: PACU   Condition: Stable   Findings: Normal-appearing uterus, ovaries, and proximal fallopian tubes.  Note: The distal fallopian tubes have been previously removed surgically.   Procedure Summary:  The patient was taken to the operating room where general anesthesia was administered and found to be adequate. She was placed in the dorsal supine lithotomy position in [place] stirrups and prepped and draped in usual sterile fashion. After a timeout was called, an indwelling catheter was placed in her bladder. A sterile speculum was placed in the vagina and a single-tooth tenaculum was used to grasp the anterior lip of the cervix. A V-Care uterine manipulator was affixed to the uterus in accordance with the manufacturers recommendations. The speculum and tenaculum was removed from the vagina.  Attention was turned to the abdomen where, after injection of local anesthetic, a 5 mm infraumbilical incision was made with the scalpel. Entry into the abdomen was obtained via Optiview trocar technique (a blunt entry technique with camera visualization through the obturator upon entry). Verification of entry into the abdomen was obtained using opening pressures. The abdomen was insufflated with CO2. The camera was  introduced through the trocar with verification of atraumatic entry. A left lower quadrant 5 mm port was created via direct intra-abdominal camera visualization without difficulty. An 11 mm right lower quadrant port was placed in a similar fashion without difficulty.  After inspection of the abdomen and pelvis with the above-noted findings, the bilateral ureters were identified and found to be well away from the operative area of interest. The LigaSure was used to transect the right round ligament and the utero-ovarian ligament was transected. Tissue was divided along the right broad ligament to the level of the anterior cervical os. Bladder tissue were dissected off the lower uterine segment and cervix without difficulty. The right uterine artery was skeletonized and identified and after ligation was transected with the LigaSure device. The same procedure was carried out on the left side. The colpotomy was performed using monopolar electrocautery in a circumferential fashion following the KOH ring.  The uterus and fallopian tubes and cervix were removed through the vagina.  Closure of the vaginal cuff was undertaken using the V-lock stitch in a running fashion. A gloved hand was placed in the vagina to assess adequate closure of the vaginal cuff. This was found to be satisfactory. All vascular pedicles were inspected and found to be hemostatic after judicious use of bipolar electrocautery at some sites of oozing near the right apex.   Cystoscopy was undertaken at this point. The Foley catheter was removed and the 30 cystoscope was gently introduced through the urethra. The bladder survey was undertaken with efflux of urine from both ureteral orifices noted. There were no defects noted in the bladder wall. The cystoscope was removed and the Foley catheter was replaced.  Attention was returned  to the pelvis and copious irrigation was undertaken and hemostasis was again verified.  To ensure continued hemostasis  3 g of Arista was placed along the vaginal cuff.  The right lower quadrant trocar was removed and the fascia was reapproximated using #0 Vicryl with a single stitch. The abdomen was then desufflated of CO2 after removal of all instruments. Five deep breaths were given by anesthesia to the patient to help with removal of CO2 from the abdomen. The right lower quadrant skin incision was closed using 4-0 Vicryl in a subcuticular fashion. The remaining skin incisions were closed using surgical skin glue and a layer of surgical skin glue was placed over the right lower quadrant skin incision, as well. The catheter was then removed from the bladder. The vagina was inspected and found to be free of instrumentation and sponges.  The patient tolerated the procedure well.  Sponge, lap, needle, and instrument counts were correct x 2.  VTE prophylaxis: SCDs. Antibiotic prophylaxis: Ancef 2 grams IV prior to skin incision. She was awakened in the operating room and was taken to the PACU in stable condition.   Prentice Docker, MD 09/01/2018 10:19 AM

## 2018-09-02 DIAGNOSIS — N870 Mild cervical dysplasia: Secondary | ICD-10-CM | POA: Diagnosis not present

## 2018-09-02 LAB — CBC
HCT: 32.8 % — ABNORMAL LOW (ref 36.0–46.0)
HCT: 33.3 % — ABNORMAL LOW (ref 36.0–46.0)
Hemoglobin: 11.5 g/dL — ABNORMAL LOW (ref 12.0–15.0)
Hemoglobin: 11.5 g/dL — ABNORMAL LOW (ref 12.0–15.0)
MCH: 32.3 pg (ref 26.0–34.0)
MCH: 32.5 pg (ref 26.0–34.0)
MCHC: 35.1 g/dL (ref 30.0–36.0)
MCHC: 34.5 g/dL (ref 30.0–36.0)
MCV: 92.1 fL (ref 80.0–100.0)
MCV: 94.1 fL (ref 80.0–100.0)
PLATELETS: 193 10*3/uL (ref 150–400)
Platelets: 217 10*3/uL (ref 150–400)
RBC: 3.56 MIL/uL — AB (ref 3.87–5.11)
RBC: 3.54 MIL/uL — ABNORMAL LOW (ref 3.87–5.11)
RDW: 11.9 % (ref 11.5–15.5)
RDW: 12.2 % (ref 11.5–15.5)
WBC: 12.4 10*3/uL — ABNORMAL HIGH (ref 4.0–10.5)
WBC: 11.7 10*3/uL — ABNORMAL HIGH (ref 4.0–10.5)
nRBC: 0 % (ref 0.0–0.2)
nRBC: 0 % (ref 0.0–0.2)

## 2018-09-02 LAB — BASIC METABOLIC PANEL
Anion gap: 3 — ABNORMAL LOW (ref 5–15)
BUN: 6 mg/dL (ref 6–20)
CALCIUM: 7.9 mg/dL — AB (ref 8.9–10.3)
CHLORIDE: 110 mmol/L (ref 98–111)
CO2: 25 mmol/L (ref 22–32)
CREATININE: 0.45 mg/dL (ref 0.44–1.00)
GFR calc non Af Amer: >60 mL/min (ref >60)
Glucose, Bld: 126 mg/dL — ABNORMAL HIGH (ref 70–99)
POTASSIUM: 3.9 mmol/L (ref 3.5–5.1)
Sodium: 138 mmol/L (ref 135–145)

## 2018-09-02 MED ORDER — HYDROCODONE-ACETAMINOPHEN 5-325 MG PO TABS: 2.0000 | ORAL_TABLET | Freq: Four times a day (QID) | ORAL | 0 refills | Status: DC | PRN

## 2018-09-02 MED ORDER — ONDANSETRON 8 MG PO TBDP: 8.0000 mg | ORAL_TABLET | Freq: Three times a day (TID) | ORAL | 0 refills | Status: DC | PRN

## 2018-09-02 MED ORDER — IBUPROFEN 800 MG PO TABS: 800.0000 mg | ORAL_TABLET | Freq: Three times a day (TID) | ORAL | 1 refills | Status: DC

## 2018-09-02 MED ORDER — IBUPROFEN 800 MG PO TABS
800.0000 mg | ORAL_TABLET | Freq: Three times a day (TID) | ORAL | Status: DC
  Administered 2018-09-02: 800 mg via ORAL
  Filled 2018-09-02: qty 1

## 2018-09-02 NOTE — Discharge Summary (Signed)
Discharge Summary   Patient ID: Denise Bryan [id number] 32 y.o. 22-Oct-1986  Admit date: 09/01/2018  Discharge date: 09/02/2018  Principal Diagnoses:  1) menorrhagia with irregular cycles 2) menorrhagia  Secondary Diagnoses:  none  Procedures performed during the hospitalization:  Total laparoscopic hysterectomy, bilateral salpingectomy, cystoscopy on 09/01/18  HPI: 32 y.o. [id number] female who has menses that are twice monthly,lasting Between 3 days to 2 weeks. Dysmenorrhea severe, occurringthroughout cycle.She has a long history of chronic pelvic pain, for which she has undergone two laparoscopies.  She has not had relief with laparoscopy as no obvious lesions have been noted, though she does have some findings consistent with endometriosis.  Multiple discussions have been had with this patient regarding management of the above and she elects hysterectomy as definitive treatment.  She understands that with pelvic pain hysterectomy may not be 100% curative.  She has had a recent abnormal Pap smear.  Her colposcopy showed CIN-1.  She has no other preoperative concerns.  Past Medical History:  Diagnosis Date  . Endometriosis   . Family history of ovarian cancer   . Gastroesophageal reflux disease 09/20/2008   no problems since gallbladder taken out!! 2007  . Genetic testing of female 09/2016   TC=8.8% PER [facility]  . Genital herpes simplex 01/02/2015  . Headache    migraines, require a visit to the emergency room  . Spontaneous abortion   . Unspecified blood type, rh negative     Past Surgical History:  Procedure Laterality Date  . CHOLECYSTECTOMY  2007  . CYSTOSCOPY N/A 09/01/2018   Procedure: CYSTOSCOPY;  Surgeon: Will Bonnet, MD;  Location: [facility] ORS;  Service: Gynecology;  Laterality: N/A;  . DIAGNOSTIC LAPAROSCOPY  04/26/2010   IUD EXPULSION/PELVIC PAIN  . DILATION AND CURETTAGE OF UTERUS  08/10/10; 08/06/07   HYDATIIDIFORM MOLE/POC, POC  . gallstones   sept 18, 2007  . HYSTEROSCOPY  04/26/2010   IUD EXPULSION /PELVIC PAIN  . LAPAROSCOPIC HYSTERECTOMY N/A 09/01/2018   Procedure: HYSTERECTOMY TOTAL LAPAROSCOPIC BILATERAL SALPINGECTOMY;  Surgeon: Will Bonnet, MD;  Location: [facility] ORS;  Service: Gynecology;  Laterality: N/A;  . LAPAROSCOPY N/A 08/21/2017   Procedure: LAPAROSCOPY DIAGNOSTIC;  Surgeon: Will Bonnet, MD;  Location: [facility] ORS;  Service: Gynecology;  Laterality: N/A;  . LAPAROSCOPY N/A 07/30/2018   Procedure: LAPAROSCOPY DIAGNOSTIC WITH BILATERAL SALPINGECTOMY;  Surgeon: Will Bonnet, MD;  Location: [facility] ORS;  Service: Gynecology;  Laterality: N/A;  . TUBAL LIGATION  01/2012   [facility]    Allergies  Allergen Reactions  . Eicosapentaenoic Acid (Epa) Anaphylaxis  . Fish-Derived Products Anaphylaxis  . Iodine Anaphylaxis  . Tramadol Anaphylaxis    Hives, throat closing    Social History   Tobacco Use  . Smoking status: Never Smoker  . Smokeless tobacco: Never Used  Substance Use Topics  . Alcohol use: No  . Drug use: No    Family History  Problem Relation Age of Onset  . Diabetes Mother   . Hypertension Mother   . Cancer Mother 51       UTERINE  . Colon cancer Mother   . Cancer Sister 20       CX  . Diabetes Maternal Uncle   . Diabetes Maternal Grandmother   . Hypertension Maternal Grandmother   . Lung disease Maternal Grandfather   . Cancer Maternal Grandfather        LUNG  . Heart disease Paternal Grandmother   . Hypertension Paternal [facility]  Course:  The patient was admitted for the above surgery on 09/01/2018, which occurred without complication.  She had postoperative pain control issues and was monitored overnight for these. She did have issues with pain control. She was initially given percocet which appears to have caused her to feel lightheaded and see spots. Her vital signs were normal.  Her blood pressure was on the low side. However, she had normal urine output (1,150  mL over the last 12 hours) and a normal pulse. She had no chest pain issues or trouble breathing. After percocet was changed to norco her symptoms improved.  Her hemoglobin dropped from a preoperative value of 17.1(2 days prior to surgery) to 12.9 the night after her surgery to 11.5 the morning after her surgery. She had a repeat hemoglobin at noon on POD#1, which returned as stabe at 11.5.  During her episodes of feeling lightheaded she had orthostatic vital signs taken (lying: BP 86/60 & Pulse 84, Sitting: BP 84/59 & Pulse 88, Standing: BP 99/65 & Pulse 71). Her admission blood pressure was 102/80. Her last BP in clinic was 101/74, which is more in line with her standing blood pressure.   Her pain was under good control, she was tolerating a PO diet, she was ambulating, and voiding spontaneously.  She was, therefore, deemed to be stable for discharge.   Discharge Exam: BP 92/62 (BP Location: Right Arm)   Pulse 78   Temp 98.4 F (36.9 C) (Oral)   Resp 18   Ht 5' 4" (1.626 m)   Wt 59.9 kg   LMP 08/18/2018 Comment: preg test negative  SpO2 99%   BMI 22.67 kg/m  Physical Exam  Constitutional: She is oriented to person, place, and time. She appears well-developed and well-nourished. No distress.  HENT:  Head: Normocephalic and atraumatic.  Eyes: Conjunctivae are normal. No scleral icterus.  Cardiovascular: Normal rate and regular rhythm.  No murmur heard. Pulmonary/Chest: Effort normal and breath sounds normal. No respiratory distress. She has no wheezes. She has no rales.  Abdominal: Soft. Bowel sounds are normal. She exhibits no distension and no mass. There is tenderness (appropriately tender). There is no rebound and no guarding.  All incision sites clean, dry, and intact  Musculoskeletal: Normal range of motion. She exhibits no edema or tenderness.  Neurological: She is alert and oriented to person, place, and time. No cranial nerve deficit.  Skin: Skin is warm and dry. No erythema.   Psychiatric: She has a normal mood and affect. Her behavior is normal. Judgment normal.    Condition at Discharge: Stable  Complications affecting treatment: None  Discharge Medications:  Allergies as of 09/02/2018      Reactions   Eicosapentaenoic Acid (epa) Anaphylaxis   Fish-derived Products Anaphylaxis   Iodine Anaphylaxis   Tramadol Anaphylaxis   Hives, throat closing      Medication List    STOP taking these medications   acetaminophen 500 MG tablet Commonly known as:  TYLENOL     TAKE these medications   HYDROcodone-acetaminophen 5-325 MG tablet Commonly known as:  NORCO/VICODIN Take 2 tablets by mouth every 6 (six) hours as needed (breakthrough pain).   ibuprofen 800 MG tablet Commonly known as:  ADVIL,MOTRIN Take 1 tablet (800 mg total) by mouth every 8 (eight) hours. What changed:    when to take this  reasons to take this   ondansetron 8 MG disintegrating tablet Commonly known as:  ZOFRAN-ODT Take 1 tablet (8 mg total) by mouth every 8 (  eight) hours as needed for nausea or vomiting.       Follow-up Information    Will Bonnet, MD. Go on 09/08/2018.   Specialty:  Obstetrics and Gynecology Why:  Keep previously scheduled Post-op appointment on 10/22 at 930 AM Contact information: [address] [place] [address] [phone]          Discharge Disposition: Discharge to home.  Signed: Prentice Docker 09/02/2018 2:38 PM

## 2018-09-02 NOTE — Progress Notes (Signed)
Pt discharged at this time; all discharge instructions given to pt on previous shift; pt discharged via wheelchair escorted by volunteer; pt going home with significant other

## 2018-09-02 NOTE — Progress Notes (Signed)
Pt c/o pain increasing and blurred vision. Orthostatic pressures preformed and pain medication given. MD notified, no new orders.

## 2018-09-03 ENCOUNTER — Telehealth: Payer: Self-pay | Admitting: Obstetrics and Gynecology

## 2018-09-03 LAB — SURGICAL PATHOLOGY

## 2018-09-03 NOTE — Telephone Encounter (Signed)
Post op patient called stating she was given Rx for hydrocodone and cannot take this as it's making her sick, also said she cannot tolerate Percocet.  She was told she could get Rx for Narcan?  She is also having post op pain, rated 10/10 and having constipation.  She is scheduled for post op next Tuesday but may need to be seen sooner.  Preferred pharmacy [facility].  Please call and advise.

## 2018-09-03 NOTE — Telephone Encounter (Signed)
Pt will bring in old RX in the morning then SDJ will send in something new

## 2018-09-04 ENCOUNTER — Other Ambulatory Visit: Payer: Self-pay | Admitting: Obstetrics and Gynecology

## 2018-09-04 DIAGNOSIS — Z90710 Acquired absence of both cervix and uterus: Secondary | ICD-10-CM

## 2018-09-04 MED ORDER — HYDROMORPHONE HCL 2 MG PO TABS: 2.0000 mg | ORAL_TABLET | Freq: Four times a day (QID) | ORAL | 0 refills | Status: DC | PRN

## 2018-09-04 NOTE — Telephone Encounter (Signed)
Patient is calling to see if her prescription is ready?

## 2018-09-04 NOTE — Telephone Encounter (Signed)
Pt brought in old RX today and SDJ RXing a new pain medication for pt

## 2018-09-04 NOTE — Telephone Encounter (Signed)
Rx for dilaudid sent to pharmacy of record

## 2018-09-08 ENCOUNTER — Encounter: Payer: Self-pay | Admitting: Obstetrics and Gynecology

## 2018-09-08 ENCOUNTER — Ambulatory Visit (INDEPENDENT_AMBULATORY_CARE_PROVIDER_SITE_OTHER): Payer: Medicaid Other | Admitting: Obstetrics and Gynecology

## 2018-09-08 VITALS — BP 118/74 | Wt 138.0 lb

## 2018-09-08 DIAGNOSIS — Z09 Encounter for follow-up examination after completed treatment for conditions other than malignant neoplasm: Secondary | ICD-10-CM

## 2018-09-08 NOTE — Progress Notes (Signed)
   Postoperative Follow-up Patient presents post op from [place] 1 week ago for abnormal uterine bleeding and pelvic pain.  Subjective: Patient reports marked improvement in her preop symptoms. Eating a regular diet without difficulty. Pain is controlled with current analgesics. Medications being used: acetaminophen, ibuprofen (OTC) and narcotic analgesics including hydromorphone (Dilaudid).  Activity: slowly increasing.  Objective: Vitals:   09/08/18 0937  BP: 118/74   Vital Signs: BP 118/74   Wt 138 lb (62.6 kg)   LMP 08/18/2018   BMI 23.69 kg/m  Constitutional: Well nourished, well developed female in no acute distress.  HEENT: normal Skin: Warm and dry.  Extremity: no edema  Abdomen: S/ NT/ ND/+BS clean, dry, intact and ecchymoses noted around incision sites.   Assessment: 32 y.o. s/p TLH progressing well  Plan: Patient has done well after surgery with no apparent complications.  I have discussed the post-operative course to date, and the expected progress moving forward.  The patient understands what complications to be concerned about.  I will see the patient in routine follow up, or sooner if needed.    Activity plan: increase slowly, pelvic rest for 8 weeks total  Prentice Docker, MD 09/08/2018, 9:51 AM

## 2018-09-14 NOTE — Anesthesia Postprocedure Evaluation (Signed)
Anesthesia Post Note  Patient: Denise Bryan  Procedure(s) Performed: HYSTERECTOMY TOTAL LAPAROSCOPIC BILATERAL SALPINGECTOMY (N/A ) CYSTOSCOPY (N/A )  Patient location during evaluation: PACU Anesthesia Type: General Level of consciousness: awake and alert and oriented Pain management: pain level controlled Vital Signs Assessment: post-procedure vital signs reviewed and stable Respiratory status: spontaneous breathing Cardiovascular status: blood pressure returned to baseline Anesthetic complications: no     Last Vitals:  Vitals:   09/02/18 1053 09/02/18 1618  BP: 92/62 91/60  Pulse: 78 90  Resp: 18 18  Temp: 36.9 C 36.7 C  SpO2:      Last Pain:  Vitals:   09/02/18 1736  TempSrc:   PainSc: 7                  [name]

## 2018-09-24 ENCOUNTER — Telehealth: Payer: Self-pay

## 2018-09-24 NOTE — Telephone Encounter (Signed)
Pt had surgery with SDJ  She states she is bleeding heavy changing pad every 15 mins . Transferred to sara to be added to [facility] schedule tomorrow

## 2018-09-25 ENCOUNTER — Encounter: Payer: Self-pay | Admitting: Obstetrics and Gynecology

## 2018-09-25 ENCOUNTER — Ambulatory Visit (INDEPENDENT_AMBULATORY_CARE_PROVIDER_SITE_OTHER): Payer: Medicaid Other | Admitting: Obstetrics and Gynecology

## 2018-09-25 VITALS — BP 118/78 | Wt 134.0 lb

## 2018-09-25 DIAGNOSIS — Z9889 Other specified postprocedural states: Secondary | ICD-10-CM

## 2018-09-25 DIAGNOSIS — Z09 Encounter for follow-up examination after completed treatment for conditions other than malignant neoplasm: Secondary | ICD-10-CM

## 2018-09-25 MED ORDER — CEPHALEXIN 500 MG PO CAPS: 500.0000 mg | ORAL_CAPSULE | Freq: Four times a day (QID) | ORAL | 0 refills | Status: AC

## 2018-09-25 NOTE — Progress Notes (Signed)
   Postoperative Follow-up Patient presents post op from [place] 3 weeks ago for abnormal uterine bleeding and pelvic pain.  Subjective: Patient reports 3 days of heavy vaginal bleeding.  She reports at one point that she was changing a pad every 15 minutes.  Today her bleeding has been much better, but she notices a darker colored discharge and an odor.  She denies fevers and chills.  She has had some lower pelvic pressure, but no abdominal pain.  She is voiding normally and having normal bowel movements.  She is otherwise functioning normally.  She denies vaginal intercourse or the placement of any object (tampon) in her vagina.   Objective: Vitals:   09/25/18 0815  BP: 118/78   Vital Signs: BP 118/78   Wt 134 lb (60.8 kg)   BMI 23.00 kg/m  Constitutional: Well nourished, well developed female in no acute distress.  HEENT: normal Skin: Warm and dry.  Extremity: no edema  Abdomen: Soft, non-tender, normal bowel sounds; no bruits, organomegaly or masses. clean, dry, intact and all without erythema, induration, warmth, and tenderness  Pelvic exam: (female chaperone present) is not limited by body habitus EGBUS: within normal limits Vagina: within normal limits and with minimal-to-no blood in the vault  Vaginal cuff is intact to visualization and palpation. Very mild induration and tenderness  Assessment: 32 y.o. s/p TLH with postoperative bleeding vaginally 3 weeks out from her surgery.  Differential includes infection, dehiscence, etc.  No overwhelming evidence to support any heavy bleeding today apart from very minimal pink tinge on the scopette.   Plan: Rx for possible infection.    Activity plan: continue pelvic rest. Bleeding precautions.   Follow up 1 week to assess ongoing symptoms.   Prentice Docker, MD 09/25/2018, 8:20 AM

## 2018-09-30 ENCOUNTER — Ambulatory Visit: Payer: Medicaid Other | Admitting: Obstetrics and Gynecology

## 2018-10-05 ENCOUNTER — Telehealth: Payer: Self-pay

## 2018-10-05 NOTE — Telephone Encounter (Signed)
Pt would like [place] or [facility] to call her back.  [phone]

## 2018-10-05 NOTE — Telephone Encounter (Signed)
I think she is ok to return to work with lifting restrictions (no greater than 20 pounds) until she is 6 weeks out from her surgery. She needs a six weeks follow up to get full clearance, especially with the recently bleeding she has had. I'm assuming she is doing better since she has not followed up with me. Prentice Docker, MD

## 2018-10-05 NOTE — Telephone Encounter (Signed)
Pt would like to go back to work ASAP, please advise if she can go back to work now or wait until her appt next Monday.

## 2018-10-06 NOTE — Telephone Encounter (Signed)
Pt aware via vm letter is up front at office for her.

## 2018-10-14 ENCOUNTER — Ambulatory Visit (INDEPENDENT_AMBULATORY_CARE_PROVIDER_SITE_OTHER): Payer: Medicaid Other | Admitting: Obstetrics and Gynecology

## 2018-10-14 ENCOUNTER — Encounter: Payer: Self-pay | Admitting: Obstetrics and Gynecology

## 2018-10-14 ENCOUNTER — Telehealth: Payer: Self-pay | Admitting: Obstetrics and Gynecology

## 2018-10-14 VITALS — BP 114/74 | Ht 64.0 in | Wt 136.0 lb

## 2018-10-14 DIAGNOSIS — R102 Pelvic and perineal pain: Secondary | ICD-10-CM

## 2018-10-14 DIAGNOSIS — N921 Excessive and frequent menstruation with irregular cycle: Secondary | ICD-10-CM

## 2018-10-14 DIAGNOSIS — Z09 Encounter for follow-up examination after completed treatment for conditions other than malignant neoplasm: Secondary | ICD-10-CM

## 2018-10-14 DIAGNOSIS — Z90710 Acquired absence of both cervix and uterus: Secondary | ICD-10-CM

## 2018-10-14 NOTE — Telephone Encounter (Signed)
Patient is calling about her return to work letter having the wrong date on it. 08/27/18 return with no restriction but is was suppose to say 11/27/17. Please advise

## 2018-10-14 NOTE — Progress Notes (Signed)
   Postoperative Follow-up Patient presents post op from [place] 6 weeks ago for abnormal uterine bleeding and pelvic pain.  Subjective: Patient reports marked improvement in her preop symptoms. Eating a regular diet without difficulty. The patient is not having any pain.  Activity: normal activities of daily living.  She occasionally has pain, but minimal. No further vaginal bleeding since last visit (a small amount a couple days after last visit, but none since).  Objective: Vitals:   10/14/18 0856  BP: 114/74   Vital Signs: BP 114/74   Ht 5\' 4"  (1.626 m)   Wt 136 lb (61.7 kg)   BMI 23.34 kg/m  Constitutional: Well nourished, well developed female in no acute distress.  HEENT: normal Skin: Warm and dry.  Extremity: no edema  Abdomen: Soft, non-tender, normal bowel sounds; no bruits, organomegaly or masses. clean, dry, intact and all without erythema, induration, warmth, and tenderness  Pelvic exam: (female chaperone present) is not limited by body habitus EGBUS: within normal limits Vagina: within normal limits and with minimal-to-no blood in the vault  Vaginal cuff is intact to visualization and palpation. No induration, warmth, and tenderness  Assessment: 32 y.o. s/p TLH with postoperative bleeding vaginally 6 weeks out from her surgery, doing well   Plan:  Activity plan: continue pelvic rest until 8 weeks postop. Bleeding precautions.   Follow up 1 year for routine annual or prn  Prentice Docker, MD 10/14/2018, 9:12 AM

## 2018-10-19 NOTE — Telephone Encounter (Signed)
Left vm that letter up front for pt

## 2018-11-04 ENCOUNTER — Ambulatory Visit (INDEPENDENT_AMBULATORY_CARE_PROVIDER_SITE_OTHER): Payer: Medicaid Other | Admitting: Obstetrics and Gynecology

## 2018-11-04 ENCOUNTER — Encounter: Payer: Self-pay | Admitting: Obstetrics and Gynecology

## 2018-11-04 VITALS — BP 103/68 | HR 67 | Wt 138.0 lb

## 2018-11-04 DIAGNOSIS — Z90710 Acquired absence of both cervix and uterus: Secondary | ICD-10-CM

## 2018-11-04 NOTE — Progress Notes (Signed)
   Postoperative Follow-up Patient presents post op from TLH/BS/Cysto 2 months ago for abnormal uterine bleeding and pelvic pain.  Subjective: Recent vaginal spotting.  Denies fevers, chills, nausea, emesis. Is tolerating a regular diet. She is having normal bowel and bladder habits. Denies that there is any possibility that the source is anal/rectal and urinary.   Objective: Vitals:   11/04/18 1524  BP: 103/68  Pulse: 67   Vital Signs: BP 103/68   Pulse 67   Wt 138 lb (62.6 kg)   LMP 08/18/2018   BMI 23.69 kg/m  Constitutional: Well nourished, well developed female in no acute distress.  HEENT: normal Skin: Warm and dry.  Extremity: no edema  Abdomen: Soft, non-tender, normal bowel sounds; no bruits, organomegaly or masses. clean, dry and intact  Pelvic exam: (female chaperone present) is not limited by body habitus EGBUS: within normal limits Vagina: vaginal cuff intact. There is a 2-42mm piece of granulation tissue in the middle of the vaginal cuff. No active bleeding. It is treated with Silver Nitrate.  Bimanual reveals that the cuff is still intact without induration and tenderness.     Assessment: 32 y.o. s/p the above surgery, with some vaginal spotting and granulation tissue.  Treated today with silver nitrate.   Plan: Return to clinic in 2 weeks for reassessment and possible re-treatment.  No intercourse in the meantime. The above was explained to the patient. She voiced understanding.   Prentice Docker, MD 11/04/2018, 3:29 PM

## 2018-11-06 ENCOUNTER — Other Ambulatory Visit: Payer: Self-pay | Admitting: Obstetrics and Gynecology

## 2018-11-06 DIAGNOSIS — N921 Excessive and frequent menstruation with irregular cycle: Secondary | ICD-10-CM

## 2018-11-06 DIAGNOSIS — G8918 Other acute postprocedural pain: Secondary | ICD-10-CM

## 2018-11-06 DIAGNOSIS — Z90710 Acquired absence of both cervix and uterus: Secondary | ICD-10-CM

## 2018-11-06 MED ORDER — IBUPROFEN 600 MG PO TABS: 600.0000 mg | ORAL_TABLET | Freq: Three times a day (TID) | ORAL | 0 refills | Status: DC | PRN

## 2018-11-23 ENCOUNTER — Encounter: Payer: Medicaid Other | Admitting: Obstetrics and Gynecology

## 2018-11-24 ENCOUNTER — Ambulatory Visit: Payer: Medicaid Other | Admitting: Obstetrics and Gynecology

## 2018-11-26 ENCOUNTER — Ambulatory Visit: Payer: Medicaid Other | Admitting: Obstetrics and Gynecology

## 2018-12-14 ENCOUNTER — Other Ambulatory Visit: Payer: Self-pay

## 2018-12-14 ENCOUNTER — Encounter: Payer: Self-pay | Admitting: *Deleted

## 2018-12-14 DIAGNOSIS — W0110XA Fall on same level from slipping, tripping and stumbling with subsequent striking against unspecified object, initial encounter: Secondary | ICD-10-CM | POA: Diagnosis not present

## 2018-12-14 DIAGNOSIS — M25562 Pain in left knee: Secondary | ICD-10-CM | POA: Diagnosis present

## 2018-12-14 DIAGNOSIS — Y929 Unspecified place or not applicable: Secondary | ICD-10-CM | POA: Diagnosis not present

## 2018-12-14 DIAGNOSIS — Y9301 Activity, walking, marching and hiking: Secondary | ICD-10-CM | POA: Insufficient documentation

## 2018-12-14 DIAGNOSIS — Y999 Unspecified external cause status: Secondary | ICD-10-CM | POA: Insufficient documentation

## 2018-12-14 NOTE — ED Triage Notes (Signed)
Pt has left knee pain.  Pt struck knee on a wall last night and fell.  Pt reports falling again today. Pt alert.

## 2018-12-15 ENCOUNTER — Emergency Department: Payer: Medicaid Other

## 2018-12-15 ENCOUNTER — Emergency Department
Admission: EM | Admit: 2018-12-15 | Discharge: 2018-12-15 | Disposition: A | Discharge status: Home / Self Care | Payer: Medicaid Other | Attending: Emergency Medicine | Admitting: Emergency Medicine

## 2018-12-15 DIAGNOSIS — M25562 Pain in left knee: Secondary | ICD-10-CM

## 2018-12-15 MED ORDER — LIDOCAINE 5 % EX PTCH: 1.0000 | MEDICATED_PATCH | CUTANEOUS | 0 refills | Status: AC

## 2018-12-15 MED ORDER — KETOROLAC TROMETHAMINE 30 MG/ML IJ SOLN
30.0000 mg | Freq: Once | INTRAMUSCULAR | Status: AC
  Administered 2018-12-15: 30 mg via INTRAMUSCULAR
  Filled 2018-12-15: qty 1

## 2018-12-15 MED ORDER — KETOROLAC TROMETHAMINE 10 MG PO TABS: 10.0000 mg | ORAL_TABLET | Freq: Four times a day (QID) | ORAL | 0 refills | Status: DC | PRN

## 2018-12-15 MED ORDER — LIDOCAINE 5 % EX PTCH
1.0000 | MEDICATED_PATCH | CUTANEOUS | Status: DC
  Administered 2018-12-15: 1 via TRANSDERMAL
  Filled 2018-12-15: qty 1

## 2018-12-15 NOTE — ED Notes (Signed)
Pt educated on proper use of crutches and knee immobilizer.

## 2018-12-15 NOTE — ED Provider Notes (Signed)
[facility] Emergency Department Provider Note   First MD Initiated Contact with Patient 12/15/18 [id number]     (approximate)  I have reviewed the triage vital signs and the nursing notes.   HISTORY  Chief Complaint Knee Pain    HPI Denise Bryan is a 33 y.o. female with below list of chronic medical conditions including torn ligament in her left knee presents to the emergency department with history of accidentally striking her left knee against a wall last night and then subsequently falling tonight accidentally knee.  Patient admits to 8 out of 10 left knee pain is worse with any movement.   Past Medical History:  Diagnosis Date  . Endometriosis   . Family history of ovarian cancer   . Gastroesophageal reflux disease 09/20/2008   no problems since gallbladder taken out!! 2007  . Genetic testing of female 09/2016   TC=8.8% PER [facility]  . Genital herpes simplex 01/02/2015  . Headache    migraines, require a visit to the emergency room  . Spontaneous abortion   . Unspecified blood type, rh negative     Patient Active Problem List   Diagnosis Date Noted  . Status post laparoscopic hysterectomy 09/01/2018  . Urinary tract infection 08/21/2017  . Endometriosis 07/02/2017  . Pelvic pain 06/23/2017  . Dyspareunia, female 06/23/2017  . Menorrhagia with irregular cycle 06/23/2017  . Genital herpes simplex 01/02/2015  . Neck sprain 04/02/2012  . Acne vulgaris 06/14/2009  . Headache 06/14/2009  . Gastroesophageal reflux disease 09/20/2008    Past Surgical History:  Procedure Laterality Date  . CHOLECYSTECTOMY  2007  . CYSTOSCOPY N/A 09/01/2018   Procedure: CYSTOSCOPY;  Surgeon: Will Bonnet, MD;  Location: [facility] ORS;  Service: Gynecology;  Laterality: N/A;  . DIAGNOSTIC LAPAROSCOPY  04/26/2010   IUD EXPULSION/PELVIC PAIN  . DILATION AND CURETTAGE OF UTERUS  08/10/10; 08/06/07   HYDATIIDIFORM MOLE/POC, POC  . gallstones  sept 18, 2007  .  HYSTEROSCOPY  04/26/2010   IUD EXPULSION /PELVIC PAIN  . LAPAROSCOPIC HYSTERECTOMY N/A 09/01/2018   Procedure: HYSTERECTOMY TOTAL LAPAROSCOPIC BILATERAL SALPINGECTOMY;  Surgeon: Will Bonnet, MD;  Location: [facility] ORS;  Service: Gynecology;  Laterality: N/A;  . LAPAROSCOPY N/A 08/21/2017   Procedure: LAPAROSCOPY DIAGNOSTIC;  Surgeon: Will Bonnet, MD;  Location: [facility] ORS;  Service: Gynecology;  Laterality: N/A;  . LAPAROSCOPY N/A 07/30/2018   Procedure: LAPAROSCOPY DIAGNOSTIC WITH BILATERAL SALPINGECTOMY;  Surgeon: Will Bonnet, MD;  Location: [facility] ORS;  Service: Gynecology;  Laterality: N/A;  . TUBAL LIGATION  01/2012   SDJ    Prior to Admission medications   Medication Sig Start Date End Date Taking? Authorizing Provider  HYDROmorphone (DILAUDID) 2 MG tablet Take 1 tablet (2 mg total) by mouth every 6 (six) hours as needed (breakthrough pain). 09/04/18   Will Bonnet, MD  ibuprofen (ADVIL,MOTRIN) 600 MG tablet Take 1 tablet (600 mg total) by mouth every 8 (eight) hours as needed (breakthrough pain). 11/06/18   Will Bonnet, MD  ondansetron (ZOFRAN ODT) 8 MG disintegrating tablet Take 1 tablet (8 mg total) by mouth every 8 (eight) hours as needed for nausea or vomiting. 09/02/18   Will Bonnet, MD    Allergies Eicosapentaenoic acid (epa); Fish-derived products; Iodine; and Tramadol  Family History  Problem Relation Age of Onset  . Diabetes Mother   . Hypertension Mother   . Cancer Mother 17       UTERINE  . Colon cancer Mother   .  Cancer Sister 20       CX  . Diabetes Maternal Uncle   . Diabetes Maternal Grandmother   . Hypertension Maternal Grandmother   . Lung disease Maternal Grandfather   . Cancer Maternal Grandfather        LUNG  . Heart disease Paternal Grandmother   . Hypertension Paternal Grandmother     Social History Social History   Tobacco Use  . Smoking status: Never Smoker  . Smokeless tobacco: Never Used  Substance Use  Topics  . Alcohol use: No  . Drug use: No    Review of Systems Constitutional: No fever/chills Eyes: No visual changes. ENT: No sore throat. Cardiovascular: Denies chest pain. Respiratory: Denies shortness of breath. Gastrointestinal: No abdominal pain.  No nausea, no vomiting.  No diarrhea.  No constipation. Genitourinary: Negative for dysuria. Musculoskeletal: Negative for neck pain.  Negative for back pain.  Positive for left knee pain Integumentary: Negative for rash. Neurological: Negative for headaches, focal weakness or numbness.  ____________________________________________   PHYSICAL EXAM:  VITAL SIGNS: ED Triage Vitals  Enc Vitals Group     BP 12/15/18 0000 114/73     Pulse Rate 12/15/18 0000 93     Resp 12/15/18 0000 (!) 8     Temp 12/15/18 0000 98.2 F (36.8 C)     Temp Source 12/15/18 0000 Oral     SpO2 12/15/18 0000 97 %     Weight 12/14/18 2359 59 kg (130 lb)     Height 12/14/18 2359 1.626 m ('5\' 4"'$ )     Head Circumference --      Peak Flow --      Pain Score 12/14/18 2359 10     Pain Loc --      Pain Edu? --      Excl. in [place]? --     Constitutional: Alert and oriented. Well appearing and in no acute distress. Eyes: Conjunctivae are normal.  Head: Atraumatic. Mouth/Throat: Mucous membranes are moist.{**  Oropharynx non-erythematous. Neck: No stridor.   Cardiovascular: Normal rate, regular rhythm. Good peripheral circulation. Grossly normal heart sounds. Respiratory: Normal respiratory effort.  No retractions. Lungs CTAB. Gastrointestinal: Soft and nontender. No distention.  Musculoskeletal: Left knee pain with very gentle palpation pain with range of motion in every direction positive valgus varus stress test with minimal movement Neurologic:  Normal speech and language. No gross focal neurologic deficits are appreciated.  Skin:  Skin is warm, dry and intact. No rash noted. Psychiatric: Mood and affect are normal. Speech and behavior are  normal.  _____________________  RADIOLOGY I, [place] N BROWN, personally viewed and evaluated these images (plain radiographs) as part of my medical decision making, as well as reviewing the written report by the radiologist.  ED MD interpretation: No fracture or acute osseous abnormality noted of the left knee.  Official radiology report(s): Dg Knee Complete 4 Views Left  Result Date: 12/15/2018 CLINICAL DATA:  Left knee pain. Struck knee on a wall last night leading to fall. Fall again today. Pain anteriorly. EXAM: LEFT KNEE - COMPLETE 4+ VIEW COMPARISON:  Radiograph 04/01/2015 FINDINGS: No evidence of fracture, dislocation, or joint effusion. Tiny medial tibiofemoral and patellofemoral spurs. The alignment and joint spaces are maintained. Soft tissues are unremarkable. IMPRESSION: 1. No fracture or acute osseous abnormality of the left knee. 2. Mild early osteoarthritis. Electronically Signed   By: Keith Rake M.D.   On: 12/15/2018 00:40     Procedures   ____________________________________________   INITIAL IMPRESSION /  ASSESSMENT AND PLAN / ED COURSE  As part of my medical decision making, I reviewed the following data within the electronic MEDICAL RECORD NUMBER  33 year old female presenting with above-stated history and physical exam secondary to left knee pain following fall.  X-ray revealed no acute osseous abnormality.  Knee immobilizer applied to the patient's knee Toradol IM administered Lidoderm patch also applied.  Considered a possibility of internal derangement of the knee and as such patient will be referred to Dr. Marry Guan orthopedic surgeon for further outpatient evaluation.    ____________________________________________  FINAL CLINICAL IMPRESSION(S) / ED DIAGNOSES  Final diagnoses:  Acute pain of left knee     MEDICATIONS GIVEN DURING THIS VISIT:  Medications - No data to display   ED Discharge Orders    None       Note:  This document was prepared  using Dragon voice recognition software and may include unintentional dictation errors.    Gregor Hams, MD 12/15/18 [phone]

## 2019-02-24 NOTE — Telephone Encounter (Signed)
Let's get her an office visit. She might need a UA/culture

## 2019-02-24 NOTE — Telephone Encounter (Signed)
Patient is schedule 03/03/19

## 2019-03-03 ENCOUNTER — Encounter: Payer: Self-pay | Admitting: Obstetrics and Gynecology

## 2019-03-03 ENCOUNTER — Ambulatory Visit (INDEPENDENT_AMBULATORY_CARE_PROVIDER_SITE_OTHER): Payer: Medicaid Other | Admitting: Obstetrics and Gynecology

## 2019-03-03 ENCOUNTER — Other Ambulatory Visit: Payer: Self-pay

## 2019-03-03 VITALS — BP 122/74 | Wt 136.0 lb

## 2019-03-03 DIAGNOSIS — R102 Pelvic and perineal pain: Secondary | ICD-10-CM

## 2019-03-03 DIAGNOSIS — R300 Dysuria: Secondary | ICD-10-CM

## 2019-03-03 DIAGNOSIS — G8929 Other chronic pain: Secondary | ICD-10-CM | POA: Diagnosis not present

## 2019-03-03 NOTE — Progress Notes (Signed)
Obstetrics & Gynecology Office Visit   Chief Complaint  Patient presents with  . Follow-up  . Urinary Tract Infection   History of Present Illness: 33 y.o. [id number] female who presents for concern for pain.  The pain is located in her lower abdomen in the general suprapubic region.  The pain radiates out to her sides sometimes.  She states that the pain is always present and describes the pain as sharp.  At times the pain is 7 out of 10, but can be a 10 out of 10.  Nothing alleviates the pain.  Aggravating factors include sitting and lying on her back.  Associated symptoms: She states that she has had episodes of vaginal bleeding where she will have spotting for 1 to 2 hours and the bleeding will go away only to return in a couple days.  She also notes dysuria and difficulty emptying her bladder.  She also has dyspareunia.  She is concerned either that she is having recurrent urinary tract infections or that her endometriosis has returned.    Past Medical History:  Diagnosis Date  . Endometriosis   . Family history of ovarian cancer   . Gastroesophageal reflux disease 09/20/2008   no problems since gallbladder taken out!! 2007  . Genetic testing of female 09/2016   TC=8.8% PER [facility]  . Genital herpes simplex 01/02/2015  . Headache    migraines, require a visit to the emergency room  . Spontaneous abortion   . Unspecified blood type, rh negative     Past Surgical History:  Procedure Laterality Date  . CHOLECYSTECTOMY  2007  . CYSTOSCOPY N/A 09/01/2018   Procedure: CYSTOSCOPY;  Surgeon: Will Bonnet, MD;  Location: [facility] ORS;  Service: Gynecology;  Laterality: N/A;  . DIAGNOSTIC LAPAROSCOPY  04/26/2010   IUD EXPULSION/PELVIC PAIN  . DILATION AND CURETTAGE OF UTERUS  08/10/10; 08/06/07   HYDATIIDIFORM MOLE/POC, POC  . gallstones  sept 18, 2007  . HYSTEROSCOPY  04/26/2010   IUD EXPULSION /PELVIC PAIN  . LAPAROSCOPIC HYSTERECTOMY N/A 09/01/2018   Procedure: HYSTERECTOMY TOTAL  LAPAROSCOPIC BILATERAL SALPINGECTOMY;  Surgeon: Will Bonnet, MD;  Location: [facility] ORS;  Service: Gynecology;  Laterality: N/A;  . LAPAROSCOPY N/A 08/21/2017   Procedure: LAPAROSCOPY DIAGNOSTIC;  Surgeon: Will Bonnet, MD;  Location: [facility] ORS;  Service: Gynecology;  Laterality: N/A;  . LAPAROSCOPY N/A 07/30/2018   Procedure: LAPAROSCOPY DIAGNOSTIC WITH BILATERAL SALPINGECTOMY;  Surgeon: Will Bonnet, MD;  Location: [facility] ORS;  Service: Gynecology;  Laterality: N/A;  . TUBAL LIGATION  01/2012   SDJ    Gynecologic History: Patient's last menstrual period was 08/18/2018.  Obstetric History: [id number]  Family History  Problem Relation Age of Onset  . Diabetes Mother   . Hypertension Mother   . Cancer Mother 63       UTERINE  . Colon cancer Mother   . Cancer Sister 20       CX  . Diabetes Maternal Uncle   . Diabetes Maternal Grandmother   . Hypertension Maternal Grandmother   . Lung disease Maternal Grandfather   . Cancer Maternal Grandfather        LUNG  . Heart disease Paternal Grandmother   . Hypertension Paternal Grandmother     Social History   Socioeconomic History  . Marital status: Single    Spouse name: Not on file  . Number of children: 3  . Years of education: 75  . Highest education level: Not on file  Occupational History  .  Occupation: hostess    Comment: works at [employer]  . Financial resource strain: Not on file  . Food insecurity:    Worry: Not on file    Inability: Not on file  . Transportation needs:    Medical: Not on file    Non-medical: Not on file  Tobacco Use  . Smoking status: Never Smoker  . Smokeless tobacco: Never Used  Substance and Sexual Activity  . Alcohol use: No  . Drug use: No  . Sexual activity: Not Currently    Birth control/protection: Surgical  Lifestyle  . Physical activity:    Days per week: Not on file    Minutes per session: Not on file  . Stress: Not on file  Relationships  . Social  connections:    Talks on phone: Not on file    Gets together: Not on file    Attends religious service: Not on file    Active member of club or organization: Not on file    Attends meetings of clubs or organizations: Not on file    Relationship status: Not on file  . Intimate partner violence:    Fear of current or ex partner: Not on file    Emotionally abused: Not on file    Physically abused: Not on file    Forced sexual activity: Not on file  Other Topics Concern  . Not on file  Social History Narrative  . Not on file    Allergies  Allergen Reactions  . Eicosapentaenoic Acid (Epa) Anaphylaxis  . Fish-Derived Products Anaphylaxis  . Iodine Anaphylaxis  . Tramadol Anaphylaxis    Hives, throat closing    Prior to Admission medications: denies    Review of Systems  Constitutional: Negative.   HENT: Negative.   Eyes: Negative.   Respiratory: Negative.   Cardiovascular: Negative.   Gastrointestinal: Negative.   Genitourinary: Positive for dysuria and hematuria. Negative for flank pain, frequency and urgency.       Occasional vaginal bleeding when voiding, dyspareunia  Musculoskeletal: Negative.   Skin: Negative.   Neurological: Negative.   Psychiatric/Behavioral: Negative.      Physical Exam BP 122/74   Wt 136 lb (61.7 kg)   LMP 08/18/2018   BMI 23.34 kg/m  Patient's last menstrual period was 08/18/2018. Physical Exam Constitutional:      General: She is not in acute distress.    Appearance: Normal appearance. She is well-developed.  Genitourinary:     Pelvic exam was performed with patient in the lithotomy position.     Vulva, inguinal canal, urethra, bladder, right adnexa and left adnexa normal.     No posterior fourchette tenderness, injury or lesion present.     No signs of injury or lesions in the vagina.     Vaginal tenderness and rugosity present.     No vaginal discharge, erythema, bleeding, ulceration, atrophy, atrophic mucosa or prolapse.      Cervix is absent.     Uterus is absent.     Genitourinary Comments: Vaginal cuff is well-healed. No lesions. She is pan-tender to palpation of her vaginal walls. I had begun the exam with no reaction from her (after obtaining permission). However, after asking her where her pain was, she began to wince and tell me her pain was everywhere.   HENT:     Head: Normocephalic and atraumatic.  Eyes:     General: No scleral icterus.    Conjunctiva/sclera: Conjunctivae normal.  Neck:  Musculoskeletal: Normal range of motion and neck supple.  Cardiovascular:     Rate and Rhythm: Normal rate and regular rhythm.     Heart sounds: No murmur. No friction rub. No gallop.   Pulmonary:     Effort: Pulmonary effort is normal. No respiratory distress.     Breath sounds: Normal breath sounds. No wheezing or rales.  Abdominal:     General: Bowel sounds are normal. There is no distension.     Palpations: Abdomen is soft. There is no mass.     Tenderness: There is abdominal tenderness (suprapubic and especially LLQ). There is no right CVA tenderness, left CVA tenderness, guarding or rebound.     Hernia: No hernia is present.  Musculoskeletal: Normal range of motion.  Neurological:     General: No focal deficit present.     Mental Status: She is alert and oriented to person, place, and time.     Cranial Nerves: No cranial nerve deficit.  Skin:    General: Skin is warm and dry.     Findings: No erythema.  Psychiatric:        Mood and Affect: Mood normal.        Behavior: Behavior normal.        Judgment: Judgment normal.    Female chaperone present for pelvic and breast  portions of the physical exam  Wet Prep: PH: 4.5 Clue Cells: Negative Fungal elements: Negative Trichomonas: Negative   Assessment: 33 y.o. [id number] female here for  1. Dysuria   2. Chronic female pelvic pain      Plan: Problem List Items Addressed This Visit    None    Visit Diagnoses    Dysuria    -  Primary    Relevant Orders   Urine Culture   Chronic female pelvic pain       Relevant Orders   US PELVIS TRANSVANGINAL NON-OB (TV ONLY)     The source of the patient's pain is unclear.  The differential includes endometriosis, chronic or recurrent urinary tract infections, pelvic floor issues, ovarian issues, gastrointestinal issues, urologic issues, musculoskeletal issues, and possible psychiatric component.  Wet prep is negative today.  Will send urine for culture to assess urinary tract infection though dipstick urinalysis today appears negative.  We will also obtain a pelvic ultrasound.  Discussed these various possibilities and will await further information.  Prentice Docker, MD 03/03/2019 4:56 PM

## 2019-03-07 LAB — URINE CULTURE

## 2019-03-09 ENCOUNTER — Other Ambulatory Visit: Payer: Self-pay

## 2019-03-09 DIAGNOSIS — G8918 Other acute postprocedural pain: Secondary | ICD-10-CM

## 2019-03-09 DIAGNOSIS — Z90710 Acquired absence of both cervix and uterus: Secondary | ICD-10-CM

## 2019-03-09 DIAGNOSIS — N921 Excessive and frequent menstruation with irregular cycle: Secondary | ICD-10-CM

## 2019-03-10 ENCOUNTER — Other Ambulatory Visit: Payer: Self-pay | Admitting: Obstetrics and Gynecology

## 2019-03-10 DIAGNOSIS — N3000 Acute cystitis without hematuria: Secondary | ICD-10-CM

## 2019-03-10 MED ORDER — IBUPROFEN 600 MG PO TABS: 600.0000 mg | ORAL_TABLET | Freq: Three times a day (TID) | ORAL | 0 refills | Status: DC | PRN

## 2019-03-10 MED ORDER — SULFAMETHOXAZOLE-TRIMETHOPRIM 800-160 MG PO TABS: 1.0000 | ORAL_TABLET | Freq: Two times a day (BID) | ORAL | 0 refills | Status: DC

## 2019-03-10 NOTE — Telephone Encounter (Signed)
Please advise 

## 2019-03-15 ENCOUNTER — Ambulatory Visit (INDEPENDENT_AMBULATORY_CARE_PROVIDER_SITE_OTHER): Payer: Medicaid Other | Admitting: Obstetrics and Gynecology

## 2019-03-15 ENCOUNTER — Ambulatory Visit (INDEPENDENT_AMBULATORY_CARE_PROVIDER_SITE_OTHER): Payer: Medicaid Other

## 2019-03-15 ENCOUNTER — Other Ambulatory Visit: Payer: Self-pay

## 2019-03-15 DIAGNOSIS — G8929 Other chronic pain: Secondary | ICD-10-CM | POA: Diagnosis not present

## 2019-03-15 DIAGNOSIS — R102 Pelvic and perineal pain: Secondary | ICD-10-CM

## 2019-03-15 NOTE — Progress Notes (Signed)
    Virtual Visit via Telephone Note  I connected with Denise Bryan on 03/18/19 at  1:50 PM EDT by telephone and verified that I am speaking with the correct person using two identifiers.   I discussed the limitations, risks, security and privacy concerns of performing an evaluation and management service by telephone and the availability of in person appointments. I also discussed with the patient that there may be a patient responsible charge related to this service. The patient expressed understanding and agreed to proceed.   The patient was at [employer] I spoke with the patient from my office The names of people involved in this encounter were: Denise Bryan and Prentice Docker, MD.   History of Present Illness: 33 y.o. [phone] female who presents in follow up from an ultrasound for pelvic pain. Her ultrasound was unremarkable.  She was diagnosed and was treated for a UTI.  She states that she is taking the medication. However, her pain is getting worse instead of better.  The pain is on her left side most of the time. She states that it was painful to get the ultrasound.  She has tried three different medications in the past, without relief.  She has tried norethindrone, NSAIDs, oral opioids (?source).    Observations/Objective:  Physical Exam could not be performed. Because of the COVID-19 outbreak this visit was performed over the phone and not in person.   Ultrasound Report: US Pelvis Transvanginal Non-ob (tv Only)  Result Date: 03/15/2019 Patient Name: Denise Bryan DOB: 1986-07-09 MRN: [id number] ULTRASOUND REPORT Location: [facility] Date of Service: 03/15/2019 Indications:Pelvic Pain Findings: Uterus removed - Partial hysterectomy (ovaries remain). Right Ovary measures 3.3 x 1.8 x 1.8 cm. It is normal in appearance. Left Ovary measures 2.8 x 1.7 x 1.9 cm. It is normal in appearance. Survey of the adnexa demonstrates no adnexal masses. There is no free fluid in  the cul de sac. Impression: 1. Partial hysterectomy - only ovaries remain. 2. Ovaries appear WNL Lillia Dallas, RDMS The ultrasound images and findings were reviewed by me and I agree with the above report. Prentice Docker, MD, [facility], [facility] 03/15/2019 1:46 PM       Assessment and Plan: 1. Chronic pelvic pain in female      Follow Up Instructions: Referral to [facility] pelvic pain clinic. Rx mobic, may take Tylenol (no other NSAIDs)   I discussed the assessment and treatment plan with the patient. The patient was provided an opportunity to ask questions and all were answered. The patient agreed with the plan and demonstrated an understanding of the instructions.   The patient was advised to call back or seek an in-person evaluation if the symptoms worsen or if the condition fails to improve as anticipated.  I provided 22 minutes of non-face-to-face time during this encounter.  Prentice Docker, MD  [facility], [facility] 03/18/2019 5:38 PM

## 2019-03-18 ENCOUNTER — Encounter: Payer: Self-pay | Admitting: Obstetrics and Gynecology

## 2019-04-02 ENCOUNTER — Encounter: Payer: Self-pay | Admitting: Emergency Medicine

## 2019-04-02 ENCOUNTER — Emergency Department
Admission: EM | Admit: 2019-04-02 | Discharge: 2019-04-02 | Discharge status: Against Medical Advice | Payer: Medicaid Other | Attending: Emergency Medicine | Admitting: Emergency Medicine

## 2019-04-02 ENCOUNTER — Other Ambulatory Visit: Payer: Self-pay

## 2019-04-02 DIAGNOSIS — R509 Fever, unspecified: Secondary | ICD-10-CM | POA: Diagnosis present

## 2019-04-02 DIAGNOSIS — B349 Viral infection, unspecified: Secondary | ICD-10-CM

## 2019-04-02 LAB — COMPREHENSIVE METABOLIC PANEL
ALT: 19 U/L (ref 0–44)
AST: 19 U/L (ref 15–41)
Albumin: 4.8 g/dL (ref 3.5–5.0)
Alkaline Phosphatase: 68 U/L (ref 38–126)
Anion gap: 11 (ref 5–15)
BUN: 10 mg/dL (ref 6–20)
CO2: 26 mmol/L (ref 22–32)
Calcium: 9.6 mg/dL (ref 8.9–10.3)
Chloride: 104 mmol/L (ref 98–111)
Creatinine, Ser: 0.49 mg/dL (ref 0.44–1.00)
GFR calc Af Amer: >60 mL/min (ref >60)
GFR calc non Af Amer: >60 mL/min (ref >60)
Glucose, Bld: 91 mg/dL (ref 70–99)
Potassium: 4.3 mmol/L (ref 3.5–5.1)
Sodium: 141 mmol/L (ref 135–145)
Total Bilirubin: 0.9 mg/dL (ref 0.3–1.2)
Total Protein: 7.8 g/dL (ref 6.5–8.1)

## 2019-04-02 LAB — CBC WITH DIFFERENTIAL/PLATELET
Abs Immature Granulocytes: 0.01 10*3/uL (ref 0.00–0.07)
Basophils Absolute: 0 10*3/uL (ref 0.0–0.1)
Basophils Relative: 1 %
Eosinophils Absolute: 0.1 10*3/uL (ref 0.0–0.5)
Eosinophils Relative: 2 %
HCT: 41.6 % (ref 36.0–46.0)
Hemoglobin: 14.4 g/dL (ref 12.0–15.0)
Immature Granulocytes: 0 %
Lymphocytes Relative: 29 %
Lymphs Abs: 1.2 10*3/uL (ref 0.7–4.0)
MCH: 32 pg (ref 26.0–34.0)
MCHC: 34.6 g/dL (ref 30.0–36.0)
MCV: 92.4 fL (ref 80.0–100.0)
Monocytes Absolute: 0.3 10*3/uL (ref 0.1–1.0)
Monocytes Relative: 6 %
Neutro Abs: 2.6 10*3/uL (ref 1.7–7.7)
Neutrophils Relative %: 62 %
Platelets: 268 10*3/uL (ref 150–400)
RBC: 4.5 MIL/uL (ref 3.87–5.11)
RDW: 11.9 % (ref 11.5–15.5)
WBC: 4.3 10*3/uL (ref 4.0–10.5)
nRBC: 0 % (ref 0.0–0.2)

## 2019-04-02 LAB — URINALYSIS, COMPLETE (UACMP) WITH MICROSCOPIC
Bilirubin Urine: NEGATIVE
Glucose, UA: NEGATIVE mg/dL
Hgb urine dipstick: NEGATIVE
Ketones, ur: NEGATIVE mg/dL
Nitrite: NEGATIVE
Protein, ur: NEGATIVE mg/dL
Specific Gravity, Urine: 1.017 (ref 1.005–1.030)
pH: 6 (ref 5.0–8.0)

## 2019-04-02 LAB — POCT PREGNANCY, URINE: Preg Test, Ur: NEGATIVE

## 2019-04-02 NOTE — ED Notes (Signed)
Went to pt room to obtain vitals - pt stated that she needs to leave because her daughter got hurt at daycare and she needed to go check on her and take her to [place] Dr Burlene Arnt notified and he went to see pt and discussed why pt should stay for entire workup - pt declined - pt was informed that she could return to the ED if she had further complications or had worse symtpoms - pt verbalized understanding - she will be signing out [facility]

## 2019-04-02 NOTE — ED Triage Notes (Signed)
Sent from [place] [facility].  Headache all week.  Fever started Tuesday.  Was tested for covid on 5/13 and it was negative.  Denies tick bites.

## 2019-04-02 NOTE — ED Provider Notes (Signed)
[facility] Emergency Department Provider Note  ____________________________________________   I have reviewed the triage vital signs and the nursing notes. Where available I have reviewed prior notes and, if possible and indicated, outside hospital notes.    HISTORY  Chief Complaint I have to go  HPI Denise Bryan is a 33 y.o. female  patient seen and evaluated during the coronavirus epidemic during a time with low staffing patient states she has been having fevers at home had a negative coronavirus test.  She did not have any vomiting, abdominal pain, shortness of breath cough and she said she is had slight headaches not worst headache of life and she unfortunately refuses any further work-up or care for me because she states she has to go pick up her daughter.  I did talk to her extensively about the possibility of other significant illness, and she voices understanding, but she tells me in front of the nurse Helene Kelp that she must leave right now.    Past Medical History:  Diagnosis Date  . Endometriosis   . Family history of ovarian cancer   . Gastroesophageal reflux disease 09/20/2008   no problems since gallbladder taken out!! 2007  . Genetic testing of female 09/2016   TC=8.8% PER [facility]  . Genital herpes simplex 01/02/2015  . Headache    migraines, require a visit to the emergency room  . Spontaneous abortion   . Unspecified blood type, rh negative     Patient Active Problem List   Diagnosis Date Noted  . Status post laparoscopic hysterectomy 09/01/2018  . Urinary tract infection 08/21/2017  . Endometriosis 07/02/2017  . Pelvic pain 06/23/2017  . Dyspareunia, female 06/23/2017  . Menorrhagia with irregular cycle 06/23/2017  . Genital herpes simplex 01/02/2015  . Neck sprain 04/02/2012  . Acne vulgaris 06/14/2009  . Headache 06/14/2009  . Gastroesophageal reflux disease 09/20/2008    Past Surgical History:  Procedure Laterality Date   . CHOLECYSTECTOMY  2007  . CYSTOSCOPY N/A 09/01/2018   Procedure: CYSTOSCOPY;  Surgeon: Will Bonnet, MD;  Location: [facility] ORS;  Service: Gynecology;  Laterality: N/A;  . DIAGNOSTIC LAPAROSCOPY  04/26/2010   IUD EXPULSION/PELVIC PAIN  . DILATION AND CURETTAGE OF UTERUS  08/10/10; 08/06/07   HYDATIIDIFORM MOLE/POC, POC  . gallstones  sept 18, 2007  . HYSTEROSCOPY  04/26/2010   IUD EXPULSION /PELVIC PAIN  . LAPAROSCOPIC HYSTERECTOMY N/A 09/01/2018   Procedure: HYSTERECTOMY TOTAL LAPAROSCOPIC BILATERAL SALPINGECTOMY;  Surgeon: Will Bonnet, MD;  Location: [facility] ORS;  Service: Gynecology;  Laterality: N/A;  . LAPAROSCOPY N/A 08/21/2017   Procedure: LAPAROSCOPY DIAGNOSTIC;  Surgeon: Will Bonnet, MD;  Location: [facility] ORS;  Service: Gynecology;  Laterality: N/A;  . LAPAROSCOPY N/A 07/30/2018   Procedure: LAPAROSCOPY DIAGNOSTIC WITH BILATERAL SALPINGECTOMY;  Surgeon: Will Bonnet, MD;  Location: [facility] ORS;  Service: Gynecology;  Laterality: N/A;  . TUBAL LIGATION  01/2012   SDJ    Prior to Admission medications   Medication Sig Start Date End Date Taking? Authorizing Provider  HYDROmorphone (DILAUDID) 2 MG tablet Take 1 tablet (2 mg total) by mouth every 6 (six) hours as needed (breakthrough pain). Patient not taking: Reported on 03/03/2019 09/04/18   Will Bonnet, MD  ibuprofen (ADVIL) 600 MG tablet Take 1 tablet (600 mg total) by mouth every 8 (eight) hours as needed (breakthrough pain). Patient not taking: Reported on 04/02/2019 03/10/19   Will Bonnet, MD  ketorolac (TORADOL) 10 MG tablet Take 1 tablet (10  mg total) by mouth every 6 (six) hours as needed. Patient not taking: Reported on 03/03/2019 12/15/18   Gregor Hams, MD  ondansetron (ZOFRAN ODT) 8 MG disintegrating tablet Take 1 tablet (8 mg total) by mouth every 8 (eight) hours as needed for nausea or vomiting. Patient not taking: Reported on 03/03/2019 09/02/18   Will Bonnet, MD   sulfamethoxazole-trimethoprim (BACTRIM DS) 800-160 MG tablet Take 1 tablet by mouth 2 (two) times daily. Patient not taking: Reported on 04/02/2019 03/10/19   Will Bonnet, MD    Allergies Eicosapentaenoic acid (epa); Fish-derived products; Iodine; and Tramadol  Family History  Problem Relation Age of Onset  . Diabetes Mother   . Hypertension Mother   . Cancer Mother 37       UTERINE  . Colon cancer Mother   . Cancer Sister 20       CX  . Diabetes Maternal Uncle   . Diabetes Maternal Grandmother   . Hypertension Maternal Grandmother   . Lung disease Maternal Grandfather   . Cancer Maternal Grandfather        LUNG  . Heart disease Paternal Grandmother   . Hypertension Paternal Grandmother     Social History Social History   Tobacco Use  . Smoking status: Never Smoker  . Smokeless tobacco: Never Used  Substance Use Topics  . Alcohol use: No  . Drug use: No    Review of Systems Limited review of systems secondary to patient's desire to leave AMA.   ____________________________________________   PHYSICAL EXAM:  VITAL SIGNS: ED Triage Vitals  Enc Vitals Group     BP 04/02/19 1833 98/68     Pulse Rate 04/02/19 1328 75     Resp 04/02/19 1328 14     Temp 04/02/19 1328 98.1 F (36.7 C)     Temp Source 04/02/19 1328 Oral     SpO2 04/02/19 1328 98 %     Weight 04/02/19 1329 135 lb (61.2 kg)     Height 04/02/19 1329 '5\' 4"'$  (1.626 m)     Head Circumference --      Peak Flow --      Pain Score 04/02/19 1328 10     Pain Loc --      Pain Edu? --      Excl. in [place]? --     Constitutional: Alert and oriented. Well appearing and in no acute distress. Eyes: Conjunctivae are normal Patient declines physical exam, she states she needs to leave right now Neurologic:  Normal speech and language. No gross focal neurologic deficits are appreciated.  Skin:  Skin is warm, dry and intact. No rash noted. Psychiatric: Mood and affect are normal. Speech and behavior are  normal.  ____________________________________________   LABS (all labs ordered are listed, but only abnormal results are displayed)  Labs Reviewed  URINALYSIS, COMPLETE (UACMP) WITH MICROSCOPIC - Abnormal; Notable for the following components:      Result Value   Color, Urine YELLOW (*)    APPearance HAZY (*)    Leukocytes,Ua TRACE (*)    Bacteria, UA FEW (*)    All other components within normal limits  URINE CULTURE  COMPREHENSIVE METABOLIC PANEL  CBC WITH DIFFERENTIAL/PLATELET  POC URINE PREG, ED  POCT PREGNANCY, URINE    Pertinent labs  results that were available during my care of the patient were reviewed by me and considered in my medical decision making (see chart for details). ____________________________________________  EKG  I personally interpreted any  EKGs ordered by me or triage  ____________________________________________  RADIOLOGY  Pertinent labs & imaging results that were available during my care of the patient were reviewed by me and considered in my medical decision making (see chart for details). If possible, patient and/or family made aware of any abnormal findings.  No results found. ____________________________________________    PROCEDURES  Procedure(s) performed: None  Procedures  Critical Care performed: None  ____________________________________________   INITIAL IMPRESSION / ASSESSMENT AND PLAN / ED COURSE  Pertinent labs & imaging results that were available during my care of the patient were reviewed by me and considered in my medical decision making (see chart for details).  Seen in no acute distress she is requesting discharge, apparently she has had a febrile illness, she looks quite well, I think she understands the risk benefits and alternatives of departure, she understands she can come back at any time but at her election to leave AMA because she needs to go get her daughter.  I did send a urine culture she does not have  any urinary symptoms however so we will forego antibiotics pending that.  Return precautions and follow-up given and understood.    ____________________________________________   FINAL CLINICAL IMPRESSION(S) / ED DIAGNOSES  Final diagnoses:  None      This chart was dictated using voice recognition software.  Despite best efforts to proofread,  errors can occur which can change meaning.      Schuyler Amor, MD 04/02/19 [phone]

## 2019-04-02 NOTE — ED Notes (Signed)
Pt reports headache and spots in vision since Sunday and fever starting Tuesday- reports she has been tested for covid before and it came back negative

## 2019-04-02 NOTE — Discharge Instructions (Addendum)
You have elected to leave [facility], if you change your mind or you feel worse in any significant way as we discussed please return to the emergency department.

## 2019-04-05 LAB — URINE CULTURE: Culture: >=100000 — AB

## 2019-04-07 NOTE — Progress Notes (Signed)
Pharmacist-Physician Communication  Patient contacted about positive urine culture. She states that her PCP had sent her an antibiotic into her pharmacy to pick up later today. She was unsure of the name but thinks it was for 3 days.  Will defer sending in another antibiotic for E. coli UTI. Patient was informed to call her PCP again if she did not feel that she had improved.  Tawnya Crook, PharmD Pharmacy Resident  04/07/2019 3:11 PM

## 2019-08-17 ENCOUNTER — Other Ambulatory Visit: Payer: Self-pay

## 2019-08-17 DIAGNOSIS — G8918 Other acute postprocedural pain: Secondary | ICD-10-CM

## 2019-08-17 DIAGNOSIS — N921 Excessive and frequent menstruation with irregular cycle: Secondary | ICD-10-CM

## 2019-08-17 DIAGNOSIS — Z90710 Acquired absence of both cervix and uterus: Secondary | ICD-10-CM

## 2019-08-17 NOTE — Telephone Encounter (Signed)
Please advise 

## 2019-08-18 MED ORDER — IBUPROFEN 600 MG PO TABS: 600.0000 mg | ORAL_TABLET | Freq: Three times a day (TID) | ORAL | 0 refills | Status: DC | PRN

## 2019-09-14 ENCOUNTER — Ambulatory Visit: Payer: Medicaid Other | Attending: Student in an Organized Health Care Education/Training Program

## 2019-11-19 HISTORY — PX: SPINAL FUSION: SHX223

## 2020-05-21 ENCOUNTER — Other Ambulatory Visit: Payer: Self-pay

## 2020-05-21 DIAGNOSIS — H9191 Unspecified hearing loss, right ear: Secondary | ICD-10-CM | POA: Diagnosis not present

## 2020-05-21 DIAGNOSIS — Z5321 Procedure and treatment not carried out due to patient leaving prior to being seen by health care provider: Secondary | ICD-10-CM | POA: Insufficient documentation

## 2020-05-21 DIAGNOSIS — C719 Malignant neoplasm of brain, unspecified: Secondary | ICD-10-CM | POA: Diagnosis not present

## 2020-05-21 DIAGNOSIS — H9201 Otalgia, right ear: Secondary | ICD-10-CM | POA: Diagnosis present

## 2020-05-21 NOTE — ED Triage Notes (Signed)
Patient reports received radiation 5 days a week for a brain tumor.  Since Friday has had decreased hearing and pain to right ear.

## 2020-05-21 NOTE — ED Notes (Signed)
First nurse note: pt has brain tumor and has been treated with radiation. Lost hearing in right ear 3 days ago. Tried to get wax out, pop ear, drops, etc with no relief.

## 2020-05-22 ENCOUNTER — Emergency Department
Admission: EM | Admit: 2020-05-22 | Discharge: 2020-05-22 | Disposition: A | Discharge status: Home / Self Care | Payer: Medicaid Other | Attending: Emergency Medicine | Admitting: Emergency Medicine

## 2020-06-08 ENCOUNTER — Other Ambulatory Visit: Payer: Self-pay | Admitting: Neurosurgery

## 2020-06-28 ENCOUNTER — Ambulatory Visit: Admit: 2020-06-28 | Payer: Self-pay | Admitting: Neurosurgery

## 2020-06-28 SURGERY — ANTERIOR CERVICAL DECOMPRESSION/DISCECTOMY FUSION 3 LEVELS
Anesthesia: General

## 2020-10-06 ENCOUNTER — Other Ambulatory Visit: Payer: Self-pay

## 2020-10-06 ENCOUNTER — Other Ambulatory Visit
Admission: RE | Admit: 2020-10-06 | Discharge: 2020-10-06 | Disposition: A | Discharge status: Home / Self Care | Payer: Medicaid Other | Source: Ambulatory Visit | Attending: Otolaryngology | Admitting: Otolaryngology

## 2020-10-06 HISTORY — DX: Anxiety disorder, unspecified: F41.9

## 2020-10-06 HISTORY — DX: Dyspnea, unspecified: R06.00

## 2020-10-06 NOTE — Patient Instructions (Signed)
Your procedure is scheduled on: Tuesday October 10, 2020. Report to Day Surgery inside [facility] 2nd floor (stop at Registration Desk first). To find out your arrival time please call [phone] between 1PM - 3PM on Monday October 09, 2020.  Remember: Instructions that are not followed completely may result in serious medical risk,  up to and including death, or upon the discretion of your surgeon and anesthesiologist your  surgery may need to be rescheduled.     _X__ 1. Do not eat food after midnight the night before your procedure.                 No chewing gum or hard candies. You may drink clear liquids up to 2 hours                 before you are scheduled to arrive for your surgery- DO not drink clear                 liquids within 2 hours of the start of your surgery.                 Clear Liquids include:  water, apple juice without pulp, clear Gatorade, G2 or                  Gatorade Zero (avoid Red/Purple/Blue), Black Coffee or Tea (Do not add                 anything to coffee or tea).  __X__2.  On the morning of surgery brush your teeth with toothpaste and water, you                may rinse your mouth with mouthwash if you wish.  Do not swallow any toothpaste of mouthwash.     _X__ 3.  No Alcohol for 24 hours before or after surgery.   _X__ 4.  Do Not Smoke or use e-cigarettes For 24 Hours Prior to Your Surgery.                 Do not use any chewable tobacco products for at least 6 hours prior to                 Surgery.  _X__  5.  Do not use any recreational drugs (marijuana, cocaine, heroin, ecstasy, MDMA or other)                For at least one week prior to your surgery.  Combination of these drugs with anesthesia                May have life threatening results.   __X__6.  Notify your doctor if there is any change in your medical condition      (cold, fever, infections).     Do not wear jewelry, make-up, hairpins, clips or nail  polish. Do not wear lotions, powders, or perfumes. You may wear deodorant. Do not shave 48 hours prior to surgery. Men may shave face and neck. Do not bring valuables to the hospital.    [facility] is not responsible for any belongings or valuables.  Contacts, dentures or bridgework may not be worn into surgery. Leave your suitcase in the car. After surgery it may be brought to your room. For patients admitted to the hospital, discharge time is determined by your treatment team.   Patients discharged the day of surgery will not be allowed to drive home.   Make  arrangements for someone to be with you for the first 24 hours of your Same Day Discharge.   __X__ Take these medicines the morning of surgery with A SIP OF WATER:    1. None   ____ Fleet Enema (as directed)   ____ Use CHG Soap (or wipes) as directed  ____ Use Benzoyl Peroxide Gel as instructed  ____ Use inhalers on the day of surgery  ____ Stop metformin 2 days prior to surgery    ____ Take 1/2 of usual insulin dose the night before surgery. No insulin the morning          of surgery.   ____ Stop Coumadin/Plavix/aspirin   __X__ Stop Anti-inflammatories on such ibuprofen (ADVIL),  ketorolac (TORADOL) 10 MG     __X__ Stop supplements until after surgery. Melatonin 10 MG   __X__ Do not start any herbal supplements before your procedure.    If you have any questions regarding your pre-procedure instructions,  Please call Pre-admit Testing at [phone].

## 2020-10-09 ENCOUNTER — Other Ambulatory Visit
Admission: RE | Admit: 2020-10-09 | Discharge: 2020-10-09 | Disposition: A | Discharge status: Home / Self Care | Payer: Medicaid Other | Source: Ambulatory Visit | Attending: Otolaryngology | Admitting: Otolaryngology

## 2020-10-09 ENCOUNTER — Other Ambulatory Visit: Payer: Self-pay

## 2020-10-09 DIAGNOSIS — Z01812 Encounter for preprocedural laboratory examination: Secondary | ICD-10-CM | POA: Insufficient documentation

## 2020-10-09 DIAGNOSIS — Z20822 Contact with and (suspected) exposure to covid-19: Secondary | ICD-10-CM | POA: Insufficient documentation

## 2020-10-09 LAB — SARS CORONAVIRUS 2 (TAT 6-24 HRS): SARS Coronavirus 2: NEGATIVE

## 2020-10-10 ENCOUNTER — Other Ambulatory Visit: Payer: Self-pay

## 2020-10-10 ENCOUNTER — Ambulatory Visit
Admission: RE | Admit: 2020-10-10 | Discharge: 2020-10-10 | Disposition: A | Discharge status: Home / Self Care | Payer: Medicaid Other | Attending: Otolaryngology | Admitting: Otolaryngology

## 2020-10-10 ENCOUNTER — Ambulatory Visit: Payer: Medicaid Other | Admitting: Certified Registered"

## 2020-10-10 ENCOUNTER — Encounter
Admission: RE | Disposition: A | Discharge status: Home / Self Care | Payer: Self-pay | Source: Home / Self Care | Attending: Otolaryngology

## 2020-10-10 ENCOUNTER — Encounter: Payer: Self-pay | Admitting: Otolaryngology

## 2020-10-10 DIAGNOSIS — Z79899 Other long term (current) drug therapy: Secondary | ICD-10-CM | POA: Insufficient documentation

## 2020-10-10 DIAGNOSIS — Z79891 Long term (current) use of opiate analgesic: Secondary | ICD-10-CM | POA: Diagnosis not present

## 2020-10-10 DIAGNOSIS — J3801 Paralysis of vocal cords and larynx, unilateral: Secondary | ICD-10-CM | POA: Diagnosis present

## 2020-10-10 HISTORY — PX: DIRECT LARYNGOSCOPY: SHX5326

## 2020-10-10 HISTORY — DX: Myoneural disorder, unspecified: G70.9

## 2020-10-10 SURGERY — LARYNGOSCOPY, DIRECT
Anesthesia: General

## 2020-10-10 MED ORDER — SUCCINYLCHOLINE CHLORIDE 20 MG/ML IJ SOLN
INTRAMUSCULAR | Status: DC | PRN
  Administered 2020-10-10: 80 mg via INTRAVENOUS

## 2020-10-10 MED ORDER — MIDAZOLAM HCL 2 MG/2ML IJ SOLN
INTRAMUSCULAR | Status: DC | PRN
  Administered 2020-10-10: 2 mg via INTRAVENOUS

## 2020-10-10 MED ORDER — SUGAMMADEX SODIUM 200 MG/2ML IV SOLN
INTRAVENOUS | Status: DC | PRN
  Administered 2020-10-10: 200 mg via INTRAVENOUS

## 2020-10-10 MED ORDER — PROPOFOL 10 MG/ML IV BOLUS
INTRAVENOUS | Status: DC | PRN
  Administered 2020-10-10: 150 mg via INTRAVENOUS

## 2020-10-10 MED ORDER — MIDAZOLAM HCL 2 MG/2ML IJ SOLN
INTRAMUSCULAR | Status: AC
  Filled 2020-10-10: qty 2

## 2020-10-10 MED ORDER — LIDOCAINE HCL (PF) 4 % IJ SOLN
INTRAMUSCULAR | Status: DC | PRN
  Administered 2020-10-10: 4 mL via INTRADERMAL

## 2020-10-10 MED ORDER — CHLORHEXIDINE GLUCONATE 0.12 % MT SOLN
OROMUCOSAL | Status: AC
  Administered 2020-10-10: 15 mL via OROMUCOSAL
  Filled 2020-10-10: qty 15

## 2020-10-10 MED ORDER — LACTATED RINGERS IV SOLN: INTRAVENOUS | Status: DC | PRN

## 2020-10-10 MED ORDER — DEXAMETHASONE SODIUM PHOSPHATE 10 MG/ML IJ SOLN
INTRAMUSCULAR | Status: DC | PRN
  Administered 2020-10-10: 10 mg via INTRAVENOUS

## 2020-10-10 MED ORDER — FENTANYL CITRATE (PF) 100 MCG/2ML IJ SOLN
INTRAMUSCULAR | Status: AC
  Administered 2020-10-10: 25 ug via INTRAVENOUS
  Filled 2020-10-10: qty 2

## 2020-10-10 MED ORDER — ONDANSETRON HCL 4 MG/2ML IJ SOLN: 4.0000 mg | Freq: Once | INTRAMUSCULAR | Status: DC | PRN

## 2020-10-10 MED ORDER — ROCURONIUM BROMIDE 100 MG/10ML IV SOLN
INTRAVENOUS | Status: DC | PRN
  Administered 2020-10-10: 30 mg via INTRAVENOUS

## 2020-10-10 MED ORDER — OXYCODONE-ACETAMINOPHEN 5-325 MG PO TABS
1.0000 | ORAL_TABLET | ORAL | 0 refills | Status: DC | PRN
Start: 2020-10-10 — End: 2021-08-29

## 2020-10-10 MED ORDER — FENTANYL CITRATE (PF) 100 MCG/2ML IJ SOLN
25.0000 ug | INTRAMUSCULAR | Status: DC | PRN
  Administered 2020-10-10 (×3): 25 ug via INTRAVENOUS

## 2020-10-10 MED ORDER — LACTATED RINGERS IV SOLN: INTRAVENOUS | Status: DC

## 2020-10-10 MED ORDER — REMIFENTANIL HCL 1 MG IV SOLR
INTRAVENOUS | Status: AC
  Filled 2020-10-10: qty 1000

## 2020-10-10 MED ORDER — FAMOTIDINE 20 MG PO TABS
ORAL_TABLET | ORAL | Status: AC
  Administered 2020-10-10: 20 mg via ORAL
  Filled 2020-10-10: qty 1

## 2020-10-10 MED ORDER — ONDANSETRON HCL 4 MG/2ML IJ SOLN
INTRAMUSCULAR | Status: DC | PRN
  Administered 2020-10-10: 4 mg via INTRAVENOUS

## 2020-10-10 MED ORDER — FENTANYL CITRATE (PF) 100 MCG/2ML IJ SOLN
INTRAMUSCULAR | Status: DC | PRN
  Administered 2020-10-10 (×2): 50 ug via INTRAVENOUS

## 2020-10-10 MED ORDER — FENTANYL CITRATE (PF) 100 MCG/2ML IJ SOLN
INTRAMUSCULAR | Status: AC
  Filled 2020-10-10: qty 2

## 2020-10-10 MED ORDER — OXYCODONE-ACETAMINOPHEN 5-325 MG PO TABS: 1.0000 | ORAL_TABLET | Freq: Once | ORAL | Status: AC

## 2020-10-10 MED ORDER — OXYMETAZOLINE HCL 0.05 % NA SOLN
NASAL | Status: AC
  Filled 2020-10-10: qty 30

## 2020-10-10 MED ORDER — ORAL CARE MOUTH RINSE: 15.0000 mL | Freq: Once | OROMUCOSAL | Status: AC

## 2020-10-10 MED ORDER — OXYCODONE-ACETAMINOPHEN 5-325 MG PO TABS
ORAL_TABLET | ORAL | Status: AC
  Administered 2020-10-10: 1 via ORAL
  Filled 2020-10-10: qty 1

## 2020-10-10 MED ORDER — PHENYLEPHRINE HCL (PRESSORS) 10 MG/ML IV SOLN
INTRAVENOUS | Status: DC | PRN
  Administered 2020-10-10 (×2): 100 ug via INTRAVENOUS

## 2020-10-10 MED ORDER — FAMOTIDINE 20 MG PO TABS: 20.0000 mg | ORAL_TABLET | Freq: Once | ORAL | Status: AC

## 2020-10-10 MED ORDER — GLYCOPYRROLATE 0.2 MG/ML IJ SOLN
INTRAMUSCULAR | Status: DC | PRN
  Administered 2020-10-10: .2 mg via INTRAVENOUS

## 2020-10-10 MED ORDER — CHLORHEXIDINE GLUCONATE 0.12 % MT SOLN: 15.0000 mL | Freq: Once | OROMUCOSAL | Status: AC

## 2020-10-10 MED ORDER — DEXMEDETOMIDINE (PRECEDEX) IN NS 20 MCG/5ML (4 MCG/ML) IV SYRINGE
PREFILLED_SYRINGE | INTRAVENOUS | Status: DC | PRN
  Administered 2020-10-10: 8 ug via INTRAVENOUS

## 2020-10-10 MED ORDER — LIDOCAINE HCL (CARDIAC) PF 100 MG/5ML IV SOSY
PREFILLED_SYRINGE | INTRAVENOUS | Status: DC | PRN
  Administered 2020-10-10: 100 mg via INTRAVENOUS

## 2020-10-10 MED ORDER — EPINEPHRINE PF 1 MG/ML IJ SOLN
INTRAMUSCULAR | Status: AC
  Filled 2020-10-10: qty 1

## 2020-10-10 MED ORDER — ONDANSETRON HCL 4 MG PO TABS: 4.0000 mg | ORAL_TABLET | Freq: Three times a day (TID) | ORAL | 0 refills | Status: DC | PRN

## 2020-10-10 SURGICAL SUPPLY — 26 items
ATOMIZER TRACHEAL (MISCELLANEOUS) IMPLANT
BASIN GRAD PLASTIC 32OZ STRL (MISCELLANEOUS) ×2 IMPLANT
BNDG EYE OVAL (GAUZE/BANDAGES/DRESSINGS) ×2 IMPLANT
COVER BACK TABLE REUSABLE LG (DRAPES) ×2 IMPLANT
COVER WAND RF STERILE (DRAPES) ×2 IMPLANT
CUP MEDICINE 2OZ PLAST GRAD ST (MISCELLANEOUS) ×4 IMPLANT
DRSG TELFA 4X3 1S NADH ST (GAUZE/BANDAGES/DRESSINGS) ×2 IMPLANT
GAUZE 4X4 16PLY RFD (DISPOSABLE) IMPLANT
GLOVE BIO SURGEON STRL SZ7.5 (GLOVE) ×2 IMPLANT
GOWN STRL REUS W/ TWL LRG LVL3 (GOWN DISPOSABLE) ×2 IMPLANT
GOWN STRL REUS W/TWL LRG LVL3 (GOWN DISPOSABLE) ×4
KIT PROLARN GEL W/NDL (Miscellaneous) ×2 IMPLANT
LABEL OR SOLS (LABEL) ×2 IMPLANT
MANIFOLD NEPTUNE II (INSTRUMENTS) ×2 IMPLANT
MARKER SKIN DUAL TIP RULER LAB (MISCELLANEOUS) ×2 IMPLANT
NDL SAFETY ECLIPSE 18X1.5 (NEEDLE) ×1 IMPLANT
NEEDLE FILTER BLUNT 18X 1/2SAF (NEEDLE)
NEEDLE FILTER BLUNT 18X1 1/2 (NEEDLE) IMPLANT
NEEDLE HYPO 18GX1.5 SHARP (NEEDLE) ×2
PATTIES SURGICAL .5 X.5 (GAUZE/BANDAGES/DRESSINGS) IMPLANT
SOL ANTI-FOG 6CC FOG-OUT (MISCELLANEOUS) ×1 IMPLANT
SOL FOG-OUT ANTI-FOG 6CC (MISCELLANEOUS) ×1
SYR 10ML LL (SYRINGE) IMPLANT
SYR 3ML LL SCALE MARK (SYRINGE) IMPLANT
TOWEL OR 17X26 4PK STRL BLUE (TOWEL DISPOSABLE) ×2 IMPLANT
TUBING CONNECTING 10 (TUBING) ×2 IMPLANT

## 2020-10-10 NOTE — Discharge Instructions (Signed)

## 2020-10-10 NOTE — Anesthesia Procedure Notes (Signed)
Procedure Name: Intubation Performed by: Kelton Pillar, CRNA Pre-anesthesia Checklist: Patient identified, Emergency Drugs available, Suction available and Patient being monitored Patient Re-evaluated:Patient Re-evaluated prior to induction Oxygen Delivery Method: Circle system utilized Preoxygenation: Pre-oxygenation with 100% oxygen Induction Type: IV induction Ventilation: Mask ventilation without difficulty Laryngoscope Size: McGraph and 3 Grade View: Grade I Tube type: MLT Tube size: 5.0 mm Number of attempts: 1 Airway Equipment and Method: Stylet and Oral airway Placement Confirmation: ETT inserted through vocal cords under direct vision,  positive ETCO2,  breath sounds checked- equal and bilateral and CO2 detector Secured at: 22 cm Tube secured with: Tape Dental Injury: Teeth and Oropharynx as per pre-operative assessment

## 2020-10-10 NOTE — Op Note (Signed)
....  10/10/2020  10:00 AM    Denise Bryan  [id number]   Pre-Op Dx:  Left vocal fold paralysis, dysphonia  Post-op Dx: same  Proc: Suspension Microlaryngoscopy with Left vocal fold injection laryngoplasty  Surg:  Denise Bryan  Anes:  GOT  EBL:  0  Comp:  none  Findings:  Left bowing of vocal fold.  Successful injection laryngoplasty with 0.62ml of Prolaryn voice GEL  Procedure: After the patient was identified in holding and the history and physical and consent was reviewed, the patient was taken to the operating room and placed in a supine position. General endotracheal anesthesia was induced in the normal fashion.  At this time, the patient was rotated 90 degrees and a shoulder roll was placed as well as well as a mouth guard.   At this time, a anterior commisure laryngoscope was inserted into the patient's oral cavity.  Visualization of the patient's oropharynx, pharynx, and larynx was made.  The left vocal fold was noted to be bowed and slightly atrophic compared to contralateral vocal fold.  At this time the patient was placed into suspension.  Using a zero degree Hopkins rod, under microscopic visualization Prolaryn voice gel was injected at the appropriate depth just anterior and lateral to the vocal process.  This was repeated slightly anteriorly for a good medialization of the vibratory margin of the left vocal fold.  Slight extravasation of the gel was removed with suction.  The patient's entire airway was next evaluated for hemostasis and meticulous suctioning was performed. 0.25ccs of 4% lidocaine was sprayed onto the larynx. The patient was released from suspension and the mouth gag and laryngoscope was removed from the patient's oral cavity without injury to teeth, lips, or gums.    Dispo:   To PACU in good condition  Plan:  Voice rest for 48 hours.  Follow up in 2 weeks.  [name]  10/10/2020 10:00 AM

## 2020-10-10 NOTE — H&P (Signed)
..  History and Physical paper copy reviewed and updated date of procedure and will be scanned into system.  Patient seen and examined and marked.  

## 2020-10-10 NOTE — Op Note (Deleted)
..  History and Physical paper copy reviewed and updated date of procedure and will be scanned into system.  Patient seen and examined and marked.  

## 2020-10-10 NOTE — Transfer of Care (Signed)
Immediate Anesthesia Transfer of Care Note  Patient: Denise Bryan  Procedure(s) Performed: INJECTION LARYNGOPLASTY C VOICE GEL, LEFT ONE VOCAL FOLD (N/A )  Patient Location: PACU  Anesthesia Type:General  Level of Consciousness: awake, drowsy, patient cooperative and responds to stimulation  Airway & Oxygen Therapy: Patient Spontanous Breathing  Post-op Assessment: Report given to RN and Post -op Vital signs reviewed and stable  Post vital signs: Reviewed and stable  Last Vitals:  Vitals Value Taken Time  BP 112/55 10/10/20 1013  Temp 36.3 C 10/10/20 1013  Pulse 68 10/10/20 1018  Resp 0 10/10/20 1018  SpO2 97 % 10/10/20 1018  Vitals shown include unvalidated device data.  Last Pain:  Vitals:   10/10/20 1013  TempSrc:   PainSc: Asleep         Complications: No complications documented.

## 2020-10-10 NOTE — Anesthesia Preprocedure Evaluation (Signed)
Anesthesia Evaluation  Patient identified by MRN, date of birth, ID band Patient awake    Reviewed: Allergy & Precautions, H&P , NPO status , Patient's Chart, lab work & pertinent test results, reviewed documented beta blocker date and time   Airway Mallampati: II  TM Distance: >3 FB Neck ROM: full    Dental  (+) Teeth Intact   Pulmonary neg pulmonary ROS, shortness of breath and with exertion,    Pulmonary exam normal        Cardiovascular Exercise Tolerance: Good negative cardio ROS Normal cardiovascular exam Rhythm:regular Rate:Normal     Neuro/Psych  Headaches, Anxiety  Neuromuscular disease negative neurological ROS  negative psych ROS   GI/Hepatic negative GI ROS, Neg liver ROS, GERD  Medicated,  Endo/Other  negative endocrine ROS  Renal/GU negative Renal ROS  negative genitourinary   Musculoskeletal   Abdominal   Peds  Hematology negative hematology ROS (+)   Anesthesia Other Findings Past Medical History: No date: Anxiety No date: Dyspnea No date: Endometriosis No date: Family history of ovarian cancer 09/20/2008: Gastroesophageal reflux disease     Comment:  no problems since gallbladder taken out!! 2007 09/2016: Genetic testing of female     Comment:  TC=8.8% PER [facility] 01/02/2015: Genital herpes simplex No date: Headache     Comment:  migraines, require a visit to the emergency room No date: Neuromuscular disorder ([place]) No date: Spontaneous abortion No date: Unspecified blood type, rh negative Past Surgical History: 2007: CHOLECYSTECTOMY 09/01/2018: CYSTOSCOPY; N/A     Comment:  Procedure: CYSTOSCOPY;  Surgeon: Will Bonnet, MD;              Location: [facility] ORS;  Service: Gynecology;  Laterality:               N/A; 04/26/2010: DIAGNOSTIC LAPAROSCOPY     Comment:  IUD EXPULSION/PELVIC PAIN 08/10/10; 08/06/07: DILATION AND CURETTAGE OF UTERUS     Comment:  HYDATIIDIFORM MOLE/POC, POC sept  18, 2007: gallstones 04/26/2010: HYSTEROSCOPY     Comment:  IUD EXPULSION [place] PAIN 09/01/2018: LAPAROSCOPIC HYSTERECTOMY; N/A     Comment:  Procedure: HYSTERECTOMY TOTAL LAPAROSCOPIC BILATERAL               SALPINGECTOMY;  Surgeon: Will Bonnet, MD;                Location: [facility] ORS;  Service: Gynecology;  Laterality:               N/A; 08/21/2017: LAPAROSCOPY; N/A     Comment:  Procedure: LAPAROSCOPY DIAGNOSTIC;  Surgeon: Will Bonnet, MD;  Location: [facility] OR[facility];  Service: Gynecology;              Laterality: N/A; 07/30/2018: LAPAROSCOPY; N/A     Comment:  Procedure: LAPAROSCOPY DIAGNOSTIC WITH BILATERAL               SALPINGECTOMY;  Surgeon: Will Bonnet, MD;                Location: [facility] ORS;  Service: Gynecology;  Laterality:               N/A; 2021: SPINAL FUSION 01/2012: TUBAL LIGATION     Comment:  SDJ BMI    Body Mass Index: 21.28 kg/m     Reproductive/Obstetrics negative OB ROS  Anesthesia Physical Anesthesia Plan  ASA: II  Anesthesia Plan: General ETT   Post-op Pain Management:    Induction:   PONV Risk Score and Plan:   Airway Management Planned:   Additional Equipment:   Intra-op Plan:   Post-operative Plan:   Informed Consent: I have reviewed the patients History and Physical, chart, labs and discussed the procedure including the risks, benefits and alternatives for the proposed anesthesia with the patient or authorized representative who has indicated his/her understanding and acceptance.     Dental Advisory Given  Plan Discussed with: CRNA  Anesthesia Plan Comments:         Anesthesia Quick Evaluation

## 2020-10-11 NOTE — Anesthesia Postprocedure Evaluation (Signed)
Anesthesia Post Note  Patient: MERIS REEDE  Procedure(s) Performed: INJECTION LARYNGOPLASTY C VOICE GEL, LEFT ONE VOCAL FOLD (N/A )  Patient location during evaluation: PACU Anesthesia Type: General Level of consciousness: awake and alert Pain management: pain level controlled Vital Signs Assessment: post-procedure vital signs reviewed and stable Respiratory status: spontaneous breathing, nonlabored ventilation, respiratory function stable and patient connected to nasal cannula oxygen Cardiovascular status: blood pressure returned to baseline and stable Postop Assessment: no apparent nausea or vomiting Anesthetic complications: no   No complications documented.   Last Vitals:  Vitals:   10/10/20 1131 10/10/20 1220  BP: 117/68 98/61  Pulse: 83 68  Resp: 18 16  Temp: (!) 36.3 C   SpO2: 100% 100%    Last Pain:  Vitals:   10/10/20 1220  TempSrc:   PainSc: 0-No pain                 Molli Barrows

## 2021-08-10 ENCOUNTER — Ambulatory Visit: Payer: Self-pay | Admitting: Obstetrics and Gynecology

## 2021-08-13 ENCOUNTER — Other Ambulatory Visit: Payer: Self-pay

## 2021-08-13 ENCOUNTER — Ambulatory Visit (INDEPENDENT_AMBULATORY_CARE_PROVIDER_SITE_OTHER): Payer: Medicaid Other | Admitting: Obstetrics & Gynecology

## 2021-08-13 ENCOUNTER — Encounter: Payer: Self-pay | Admitting: Obstetrics & Gynecology

## 2021-08-13 VITALS — BP 120/80 | Ht 64.0 in

## 2021-08-13 DIAGNOSIS — N809 Endometriosis, unspecified: Secondary | ICD-10-CM | POA: Diagnosis not present

## 2021-08-13 DIAGNOSIS — R1032 Left lower quadrant pain: Secondary | ICD-10-CM

## 2021-08-13 NOTE — Progress Notes (Signed)
Gynecology Pelvic Pain Evaluation   Chief Complaint  Patient presents with   Pelvic Pain    Left sided     History of Present Illness:   Patient is a 35 y.o. [id number] who LMP was Patient's last menstrual period was 08/18/2018., presents today for a problem visit.  She complains of pain.   Her pain is localized to the LLQ area, described as constant, sharp, and stabbing, began several weeks ago and its severity is described as moderate. The pain radiates to the  Non-radiating. She has these associated symptoms which include none. Patient has these modifiers which include relaxation and pain medication that make it better and unable to associate with any factor that make it worse.  Context includes: prior h/o endometriosis; has always had more left sided pain when she has had pain; prior TLH for endometriosis 2019.  No recent US.  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 Also,  has a past surgical history that includes gallstones (08/05/2006); Diagnostic laparoscopy (04/26/2010); Dilation and curettage of uterus (08/10/10; 08/06/07); Hysteroscopy (04/26/2010); laparoscopy (N/A, 08/21/2017); Cholecystectomy (2007); Tubal ligation (01/2012); laparoscopy (N/A, 07/30/2018); Laparoscopic hysterectomy (N/A, 09/01/2018); Cystoscopy (N/A, 09/01/2018); Spinal fusion (2021); Direct laryngoscopy (N/A, 10/10/2020); and Brain tumor excision., family history includes Cancer in her maternal grandfather; Cancer (age of onset: 106) in her sister; Cancer (age of onset: 50) in her mother; Colon cancer in her mother; Diabetes in her maternal grandmother, maternal uncle, and mother;  Heart disease in her paternal grandmother; Hypertension in her maternal grandmother, mother, and paternal grandmother; Lung disease in her maternal grandfather.,  reports that she has never smoked. She has never used smokeless tobacco. She reports that she does not drink alcohol and does not use drugs.  She has a current medication list which includes the following prescription(s): zolpidem, melatonin, ondansetron, oxycodone, and oxycodone-acetaminophen. Also, is allergic to eicosapentaenoic acid (epa), fish-derived products, iodine, and tramadol.  Review of Systems  Constitutional:  Negative for chills, fever and malaise/fatigue.  HENT:  Negative for congestion, sinus pain and sore throat.   Eyes:  Negative for blurred vision and pain.  Respiratory:  Negative for cough and wheezing.   Cardiovascular:  Negative for chest pain and leg swelling.  Gastrointestinal:  Positive for abdominal pain. Negative for constipation, diarrhea, heartburn, nausea and vomiting.  Genitourinary:  Negative for dysuria, frequency, hematuria and urgency.  Musculoskeletal:  Negative for back pain, joint pain, myalgias and neck pain.  Skin:  Negative for itching and rash.  Neurological:  Negative for dizziness, tremors and weakness.  Endo/Heme/Allergies:  Does not bruise/bleed easily.  Psychiatric/Behavioral:  Negative for depression. The patient is not nervous/anxious and does not have insomnia.    Objective: BP 120/80   Ht 5\' 4"  (1.626 m)   LMP 08/18/2018   BMI 21.28 kg/m  Physical Exam Constitutional:      General: She is not in acute distress.    Appearance: She is well-developed.  Genitourinary:     Bladder and urethral meatus normal.     Genitourinary Comments: Cuff intact/ no lesions  Absent uterus and cervix     Vaginal cuff intact.    No vaginal erythema or bleeding.      Right Adnexa: not tender and no  mass present.    Left Adnexa: tender, full and mass present.    Adnexa exam comments: 5-6 cm  fullness c/w endometrioma, dermoid, or other cyst.     Cervix is absent.     Uterus is absent.     Pelvic exam was performed with patient in the lithotomy position.  HENT:     Head: Normocephalic and atraumatic.     Nose: Nose normal.  Abdominal:     General: There is no distension.     Palpations: Abdomen is soft.     Tenderness: There is no abdominal tenderness.  Musculoskeletal:        General: Normal range of motion.  Neurological:     Mental Status: She is alert and oriented to person, place, and time.     Cranial Nerves: No cranial nerve deficit.  Skin:    General: Skin is warm and dry.  Psychiatric:        Attention and Perception: Attention normal.        Mood and Affect: Mood normal.        Speech: Speech normal.        Behavior: Behavior normal.        Cognition and Memory: Cognition normal.        Judgment: Judgment normal.    Female chaperone present for pelvic portion of the physical exam  Assessment: 35 y.o. [id number] with  pelvic pain .  1. LLQ pain 2. Endometriosis - Will assess by [place]. Treatment based on results, or persistence of pain May benefit from left ovarian cystectomy or oophorectomy    Pt would desire LSO, as has never had right sided pain, always on left, and h/o endometriosis on that side.  BSO not recommended based on young age, although would be the curative tx for endometriosis - US PELVIC COMPLETE WITH TRANSVAGINAL; Future  Barnett Applebaum, MD, [facility], [facility] 08/13/2021  1:54 PM

## 2021-08-27 ENCOUNTER — Other Ambulatory Visit: Payer: Self-pay

## 2021-08-27 ENCOUNTER — Ambulatory Visit
Admission: RE | Admit: 2021-08-27 | Discharge: 2021-08-27 | Disposition: A | Discharge status: Home / Self Care | Payer: Medicaid Other | Source: Ambulatory Visit | Attending: Obstetrics & Gynecology | Admitting: Obstetrics & Gynecology

## 2021-08-27 DIAGNOSIS — N809 Endometriosis, unspecified: Secondary | ICD-10-CM | POA: Insufficient documentation

## 2021-08-27 DIAGNOSIS — R1032 Left lower quadrant pain: Secondary | ICD-10-CM | POA: Diagnosis present

## 2021-08-29 ENCOUNTER — Ambulatory Visit: Payer: Self-pay | Admitting: Obstetrics and Gynecology

## 2021-08-29 ENCOUNTER — Ambulatory Visit: Payer: Medicaid Other | Admitting: Obstetrics and Gynecology

## 2021-08-29 ENCOUNTER — Encounter: Payer: Self-pay | Admitting: Obstetrics and Gynecology

## 2021-08-29 ENCOUNTER — Other Ambulatory Visit: Payer: Self-pay

## 2021-08-29 ENCOUNTER — Ambulatory Visit (INDEPENDENT_AMBULATORY_CARE_PROVIDER_SITE_OTHER): Payer: Medicaid Other | Admitting: Obstetrics and Gynecology

## 2021-08-29 ENCOUNTER — Ambulatory Visit: Payer: Medicaid Other

## 2021-08-29 VITALS — BP 112/66 | HR 73 | Ht 64.0 in | Wt 150.0 lb

## 2021-08-29 DIAGNOSIS — Z01419 Encounter for gynecological examination (general) (routine) without abnormal findings: Secondary | ICD-10-CM | POA: Diagnosis not present

## 2021-08-29 DIAGNOSIS — N83202 Unspecified ovarian cyst, left side: Secondary | ICD-10-CM

## 2021-08-29 DIAGNOSIS — G8929 Other chronic pain: Secondary | ICD-10-CM | POA: Diagnosis not present

## 2021-08-29 DIAGNOSIS — Z8041 Family history of malignant neoplasm of ovary: Secondary | ICD-10-CM

## 2021-08-29 DIAGNOSIS — R102 Pelvic and perineal pain: Secondary | ICD-10-CM | POA: Diagnosis not present

## 2021-08-29 DIAGNOSIS — Z1239 Encounter for other screening for malignant neoplasm of breast: Secondary | ICD-10-CM

## 2021-08-29 DIAGNOSIS — R1032 Left lower quadrant pain: Secondary | ICD-10-CM

## 2021-08-29 DIAGNOSIS — N809 Endometriosis, unspecified: Secondary | ICD-10-CM

## 2021-08-29 NOTE — Progress Notes (Signed)
Gynecology Annual Exam   [facility]P: [facility] [place]  Chief Complaint:  Chief Complaint  Patient presents with   Gynecologic Exam    Annual -  F/U ultrasound, no concerns.    History of Present Illness: Patient is a 35 y.o. [id number] presents for annual exam. The patient has no complaints today.   LMP: Patient's last menstrual period was 08/18/2018. Amenorrhea status post prior TLH, BS 2019  Patient with a history of chronic pain, she is being followed by pain management at [facility].  Currently on gabapentin, cymbalta, topomax, and oxycodone.  She has had spinal fusion at C4-C7 and C3-T1 with neurosurgery.  The patient underwent hysterectomy for chronic pelvic pain with Dr. Glennon Mac on 09/01/2018, final pathology showing CIN I, adenomyosis, and NO EVIDENCE OF ENDOMETRIOSIS.    The patient is sexually active. She currently uses status post hysterectomy for contraception. She has dyspareunia.   There is notable family history of breast or ovarian cancer in her family.  The patient wears seatbelts: yes.   The patient has regular exercise: not asked.    The patient denies current symptoms of depression.    Patient is a 35 y.o. female who presents today for ultrasound evaluation of pelvic pain .  Ultrasound demonstrates the following findgins Adnexa: LEFT ovary 3.6 x 2.9 x 3.5 cm, containing a small amount of hypoechoic fluid as well as a large area of fairly homogeneous internal echogenicity, potentially representing an endometrioma in a patient with a history of endometriosis though dermoid tumor not entirely excluded Uterus: surgically absent Additional: no free fluid   Review of Systems: ROS  Past Medical History:  Patient Active Problem List   Diagnosis Date Noted   Status post laparoscopic hysterectomy 09/01/2018   Urinary tract infection 08/21/2017   Endometriosis 07/02/2017   Pelvic pain 06/23/2017   Dyspareunia, female 06/23/2017   Menorrhagia with  irregular cycle 06/23/2017   Genital herpes simplex 01/02/2015   Neck sprain 04/02/2012   Acne vulgaris 06/14/2009   Headache 06/14/2009   Gastroesophageal reflux disease 09/20/2008    Past Surgical History:  Past Surgical History:  Procedure Laterality Date   BRAIN TUMOR EXCISION     CHOLECYSTECTOMY  2007   CYSTOSCOPY N/A 09/01/2018   Procedure: CYSTOSCOPY;  Surgeon: Will Bonnet, MD;  Location: [facility];  Service: Gynecology;  Laterality: N/A;   DIAGNOSTIC LAPAROSCOPY  04/26/2010   IUD EXPULSION/PELVIC PAIN   DILATION AND CURETTAGE OF UTERUS  08/10/10; 08/06/07   HYDATIIDIFORM MOLE/POC, POC   DIRECT LARYNGOSCOPY N/A 10/10/2020   Procedure: INJECTION LARYNGOPLASTY C VOICE GEL, LEFT ONE VOCAL FOLD;  Surgeon: Carloyn Manner, MD;  Location: [facility];  Service: ENT;  Laterality: N/A;   gallstones  08/05/2006   HYSTEROSCOPY  04/26/2010   IUD EXPULSION [place] N/A 09/01/2018   Procedure: HYSTERECTOMY TOTAL LAPAROSCOPIC BILATERAL SALPINGECTOMY;  Surgeon: Will Bonnet, MD;  Location: [facility];  Service: Gynecology;  Laterality: N/A;   LAPAROSCOPY N/A 08/21/2017   Procedure: LAPAROSCOPY DIAGNOSTIC;  Surgeon: Will Bonnet, MD;  Location: [facility];  Service: Gynecology;  Laterality: N/A;   LAPAROSCOPY N/A 07/30/2018   Procedure: LAPAROSCOPY DIAGNOSTIC WITH BILATERAL SALPINGECTOMY;  Surgeon: Will Bonnet, MD;  Location: [facility];  Service: Gynecology;  Laterality: N/A;   SPINAL FUSION  2021   TUBAL LIGATION  01/2012   SDJ    Gynecologic History:  Patient's last menstrual period was 08/18/2018. Contraception: status post hysterectomy Last Pap:  Results were: 07/13/2018  AGUS and HPV negative  , 08/06/2018 Colposcopy CIN I negative endometrial Bx  Obstetric History: [id number]  Family History:  Family History  Problem Relation Age of Onset   Diabetes Mother    Hypertension Mother    Cancer Mother 89       UTERINE   Colon cancer  Mother    Cancer Sister 70       CX   Diabetes Maternal Uncle    Diabetes Maternal Grandmother    Hypertension Maternal Grandmother    Lung disease Maternal Grandfather    Cancer Maternal Grandfather        LUNG   Heart disease Paternal Grandmother    Hypertension Paternal Grandmother     Social History:  Social History   Socioeconomic History   Marital status: Single    Spouse name: Not on file   Number of children: 3   Years of education: 12   Highest education level: Not on file  Occupational History   Occupation: hostess    Comment: works at [employer] Use   Smoking status: Never   Smokeless tobacco: Never  Scientific laboratory technician Use: Never used  Substance and Sexual Activity   Alcohol use: No   Drug use: No   Sexual activity: Not Currently    Birth control/protection: Surgical    Comment: Hysterectomy  Other Topics Concern   Not on file  Social History Narrative   Not on file   Social Determinants of Health   Financial Resource Strain: Not on file  Food Insecurity: Not on file  Transportation Needs: Not on file  Physical Activity: Not on file  Stress: Not on file  Social Connections: Not on file  Intimate Partner Violence: Not on file    Allergies:  Allergies  Allergen Reactions   Eicosapentaenoic Acid (Epa) Anaphylaxis   Fish-Derived Products Anaphylaxis   Iodine Anaphylaxis   Tramadol Anaphylaxis and Hives    Medications: Prior to Admission medications   Medication Sig Start Date End Date Taking? Authorizing Provider  zolpidem (AMBIEN) 5 MG tablet Take by mouth. 06/06/21  Yes [provider]  Melatonin 10 MG TABS Take 10 mg by mouth. Patient not taking: Reported on 08/13/2021    [provider]  ondansetron (ZOFRAN) 4 MG tablet Take 1 tablet (4 mg total) by mouth every 8 (eight) hours as needed for up to 10 doses for nausea or vomiting. Patient not taking: Reported on 08/13/2021 10/10/20   Carloyn Manner, MD  oxyCODONE  (OXY IR/ROXICODONE) 5 MG immediate release tablet Take by mouth. Patient not taking: Reported on 08/13/2021 07/16/21   [provider]  oxyCODONE-acetaminophen (PERCOCET) 5-325 MG tablet Take 1 tablet by mouth every 4 (four) hours as needed for up to 30 doses for severe pain. Patient not taking: Reported on 08/13/2021 10/10/20   Carloyn Manner, MD    Physical Exam Vitals: Blood pressure 112/66, pulse 73, height '5\' 4"'  (1.626 m), weight 150 lb (68 kg), last menstrual period 08/18/2018, unknown if currently breastfeeding.  General: NAD HEENT: normocephalic, anicteric Pulmonary: No increased work of breathing, CTAB Breast symmetrical, no tenderness, no palpable nodules or masses, no skin or nipple retraction present, no nipple discharge.  No axillary or supraclavicular lymphadenopathy. Abdomen: soft, non-tender, non-distended.  Umbilicus without lesions.  No hepatomegaly, splenomegaly or masses palpable. No evidence of hernia  Genitourinary:  External: Normal external female genitalia.  Normal urethral meatus, normal Bartholin's and Skene's glands.  Vagina: Normal vaginal mucosa, no evidence of prolapse.    Cervix: surgically absent  Uterus: surgically absent  Adnexa: ovaries non-enlarged, no adnexal masses  Rectal: deferred  Lymphatic: no evidence of inguinal lymphadenopathy Extremities: no edema, erythema, or tenderness Neurologic: Grossly intact Psychiatric: mood appropriate, affect full  Female chaperone present for pelvic and breast  portions of the physical exam  Prior Diagnostic Laparoscopy Findings 9/13/20219  Intraoperative findings revealed a peritoneal window otherwise not definitive findings of endometriosis.  US PELVIC COMPLETE WITH TRANSVAGINAL  Result Date: 08/28/2021 CLINICAL DATA:  LEFT lower quadrant pain and cyst, post partial hysterectomy, history of endometriosis EXAM: TRANSABDOMINAL AND TRANSVAGINAL ULTRASOUND OF PELVIS TECHNIQUE: Both transabdominal  and transvaginal ultrasound examinations of the pelvis were performed. Transabdominal technique was performed for global imaging of the pelvis including uterus, ovaries, adnexal regions, and pelvic cul-de-sac. It was necessary to proceed with endovaginal exam following the transabdominal exam to characterize a LEFT adnexal lesion COMPARISON:  03/15/2019 FINDINGS: Uterus Not visualized, likely obscured by bowel Endometrium Surgically absent. Right ovary Measurements: 3.1 x 1.8 x 2.2 cm = volume: 6.4 mL. Normal morphology without mass Left ovary Measurements: 4.2 x 3.1 x 3.2 cm = volume: 23 mL. Complex mass arising from LEFT ovary 3.6 x 2.9 x 3.5 cm, containing a small amount of hypoechoic fluid as well as a large area of fairly homogeneous internal echogenicity, potentially representing an endometrioma in a patient with a history of endometriosis though dermoid tumor not entirely excluded. Appearance is less typical for hemorrhagic cyst. Other findings Small amount of nonspecific free pelvic fluid in LEFT adnexa adjacent to LEFT ovary. No other pelvic masses. IMPRESSION: Post hysterectomy with normal appearing RIGHT ovary. Complicated lesion of the LEFT ovary 3.6 cm diameter, favor endometrioma; follow-up ultrasound recommended in 6-12 weeks to reassess. Electronically Signed   By: Lavonia Dana M.D.   On: 08/28/2021 11:10     Assessment: 35 y.o. [id number] routine annual exam  Plan: Problem List Items Addressed This Visit   None Visit Diagnoses     Encounter for gynecological examination without abnormal finding    -  Primary   Breast screening       Family history of ovarian cancer       Relevant Orders   Integrated BRACAnalysis ([facility])   CA 125 (Completed)   Left ovarian cyst       Relevant Orders   US PELVIC COMPLETE WITH TRANSVAGINAL   CA 125 (Completed)       1) STI screening  was notoffered and therefore not obtained  2)  ASCCP guidelines and rational discussed.   Patient opts for discontinue secondary to prior hysterectomy screening interval  3) Contraception - the patient is currently using  status post hysterectomy.  She is not currently in need of contraception secondary to being sterile  4) Routine healthcare maintenance including cholesterol, diabetes screening discussed managed by PCP  5) Pelvic pain - in setting of chronic pain, status post prior hysterectomy, no pathology confirmation of endometriosis.  Her most recent ultrasound revealed a left ovarian cyst, hemorrhagic vs endometrioma.  Based on prior pathology, pathogenesis of endometriosis thought to be retrograde menstruation I favor hemorrhagic cyst.  Will obtain follow up ultrasound in 6-8 weeks.  In the meantime we discussed trial of Freida Busman if she wishes to see if any improvement.  If cyst remains present on follow up imaging we discussed laparoscopic ovarian cystectomy.   - patient related family history of ovarian cancer so CA-125  ordered, we discussed that this is often time also elevated in the setting of endometriosis - BRCA testing obtained to help guide decision making on other ovary - laterality of her current symptoms do fit with laterality of cyst  6)  No follow-ups on file.   Malachy Mood, MD, [facility], [facility] 08/29/2021, 2:45 PM

## 2021-08-30 LAB — CA 125: Cancer Antigen (CA) 125: 8.9 U/mL (ref 0.0–38.1)

## 2021-08-30 NOTE — Progress Notes (Signed)
Sch tele follow up appt to discuss [place] results today or tomorrow

## 2021-08-31 ENCOUNTER — Other Ambulatory Visit: Payer: Self-pay | Admitting: Obstetrics and Gynecology

## 2021-08-31 MED ORDER — ORILISSA 150 MG PO TABS: 1.0000 | ORAL_TABLET | Freq: Every day | ORAL | 5 refills | Status: DC

## 2021-08-31 NOTE — Progress Notes (Signed)
Patient request trial or orilissa while awaiting follow up ultrasound.  Discussed normal Ca-125

## 2021-09-20 ENCOUNTER — Ambulatory Visit: Payer: Medicaid Other | Admitting: Obstetrics and Gynecology

## 2021-10-29 ENCOUNTER — Other Ambulatory Visit: Payer: Self-pay

## 2021-10-29 ENCOUNTER — Ambulatory Visit
Admission: RE | Admit: 2021-10-29 | Discharge: 2021-10-29 | Disposition: A | Discharge status: Home / Self Care | Payer: Medicaid Other | Source: Ambulatory Visit | Attending: Obstetrics and Gynecology | Admitting: Obstetrics and Gynecology

## 2021-10-29 DIAGNOSIS — N83202 Unspecified ovarian cyst, left side: Secondary | ICD-10-CM

## 2021-10-31 ENCOUNTER — Ambulatory Visit: Payer: Medicaid Other | Admitting: Obstetrics and Gynecology

## 2021-10-31 ENCOUNTER — Encounter: Payer: Self-pay | Admitting: Obstetrics and Gynecology

## 2021-10-31 NOTE — Progress Notes (Incomplete)
Gynecology Ultrasound Follow Up  Chief Complaint: No chief complaint on file.    History of Present Illness: Patient is a 35 y.o. female who presents today for ultrasound evaluation of pelvic pain.  Ultrasound demonstrates the following findgins Adnexa: no adnexal masses Uterus: Non-enlarged without evidence of uterine fibroids with endometrial stripe non-thickened, no focal lesions Additional: no free fluid  Review of Systems: ROS  Past Medical History:  Past Medical History:  Diagnosis Date   Anxiety    Brain cancer ([place])    Dyspnea    Endometriosis    Family history of ovarian cancer    Gastroesophageal reflux disease 09/20/2008   no problems since gallbladder taken out!! 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[place])    Neuromuscular disorder ([place])    Spontaneous abortion    Unspecified blood type, rh negative     Past Surgical History:  Past Surgical History:  Procedure Laterality Date   BRAIN TUMOR EXCISION     CHOLECYSTECTOMY  2007   CYSTOSCOPY N/A 09/01/2018   Procedure: CYSTOSCOPY;  Surgeon: Will Bonnet, MD;  Location: [facility];  Service: Gynecology;  Laterality: N/A;   DIAGNOSTIC LAPAROSCOPY  04/26/2010   IUD EXPULSION/PELVIC PAIN   DILATION AND CURETTAGE OF UTERUS  08/10/10; 08/06/07   HYDATIIDIFORM MOLE/POC, POC   DIRECT LARYNGOSCOPY N/A 10/10/2020   Procedure: INJECTION LARYNGOPLASTY C VOICE GEL, LEFT ONE VOCAL FOLD;  Surgeon: Carloyn Manner, MD;  Location: [facility];  Service: ENT;  Laterality: N/A;   gallstones  08/05/2006   HYSTEROSCOPY  04/26/2010   IUD EXPULSION [place] N/A 09/01/2018   Procedure: HYSTERECTOMY TOTAL LAPAROSCOPIC BILATERAL SALPINGECTOMY;  Surgeon: Will Bonnet, MD;  Location: [facility];  Service: Gynecology;   Laterality: N/A;   LAPAROSCOPY N/A 08/21/2017   Procedure: LAPAROSCOPY DIAGNOSTIC;  Surgeon: Will Bonnet, MD;  Location: [facility];  Service: Gynecology;  Laterality: N/A;   LAPAROSCOPY N/A 07/30/2018   Procedure: LAPAROSCOPY DIAGNOSTIC WITH BILATERAL SALPINGECTOMY;  Surgeon: Will Bonnet, MD;  Location: [facility];  Service: Gynecology;  Laterality: N/A;   SPINAL FUSION  2021   TUBAL LIGATION  01/2012   SDJ    Gynecologic History:  Patient's last menstrual period was 08/18/2018. Contraception: {method:5051} Last Pap: ***. Results were: .{Findings; lab pap smear results:[id number]::"no abnormalities"}  Family History:  Family History  Problem Relation Age of Onset   Diabetes Mother    Hypertension Mother    Cancer Mother 63       UTERINE   Colon cancer Mother    Cancer Sister 33       CX   Diabetes Maternal Uncle    Diabetes Maternal Grandmother    Hypertension Maternal Grandmother    Lung disease Maternal Grandfather    Cancer Maternal Grandfather        LUNG   Heart disease Paternal Grandmother    Hypertension Paternal Grandmother     Social History:  Social History   Socioeconomic History   Marital status: Single    Spouse name: Not on file   Number of children: 3   Years of education: 12   Highest education level: Not on file  Occupational History   Occupation: hostess    Comment: works at [employer]  Smoking status: Never   Smokeless tobacco: Never  Vaping Use   Vaping Use: Never used  Substance and Sexual Activity   Alcohol use: No   Drug use: No   Sexual activity: Not Currently    Birth control/protection: Surgical    Comment: Hysterectomy  Other Topics Concern   Not on file  Social History Narrative   Not on file   Social Determinants of Health   Financial Resource Strain: Not on file  Food Insecurity: Not on file  Transportation Needs: Not on file  Physical Activity: Not on file  Stress: Not on  file  Social Connections: Not on file  Intimate Partner Violence: Not on file    Allergies:  Allergies  Allergen Reactions   Eicosapentaenoic Acid (Epa) Anaphylaxis   Fish-Derived Products Anaphylaxis   Iodine Anaphylaxis   Tramadol Anaphylaxis and Hives    Medications: Prior to Admission medications   Medication Sig Start Date End Date Taking? Authorizing Provider  Elagolix Sodium (ORILISSA) 150 MG TABS Take 1 tablet by mouth daily. 08/31/21   Malachy Mood, MD  zolpidem Lorrin Mais) 5 MG tablet Take by mouth. 06/06/21   [provider]    Physical Exam Vitals: Last menstrual period 08/18/2018, unknown if currently breastfeeding.  General: NAD HEENT: normocephalic, anicteric Pulmonary: No increased work of breathing Extremities: no edema, erythema, or tenderness Neurologic: Grossly intact, normal gait Psychiatric: mood appropriate, affect full   Assessment: 35 y.o. [id number] No problem-specific Assessment & Plan notes found for this encounter.   Plan: Problem List Items Addressed This Visit   None   1)    Malachy Mood, MD, [place], [facility] 10/31/2021, 6:03 AM

## 2021-11-19 ENCOUNTER — Other Ambulatory Visit: Payer: Self-pay | Admitting: Obstetrics and Gynecology

## 2021-11-19 DIAGNOSIS — R102 Pelvic and perineal pain: Secondary | ICD-10-CM

## 2021-11-19 DIAGNOSIS — G8929 Other chronic pain: Secondary | ICD-10-CM

## 2022-02-18 ENCOUNTER — Other Ambulatory Visit: Payer: Self-pay

## 2022-02-18 ENCOUNTER — Emergency Department
Admission: EM | Admit: 2022-02-18 | Discharge: 2022-02-18 | Disposition: A | Discharge status: Home / Self Care | Payer: Medicaid Other | Attending: Emergency Medicine | Admitting: Emergency Medicine

## 2022-02-18 ENCOUNTER — Emergency Department: Payer: Medicaid Other

## 2022-02-18 DIAGNOSIS — K625 Hemorrhage of anus and rectum: Secondary | ICD-10-CM | POA: Diagnosis not present

## 2022-02-18 DIAGNOSIS — K921 Melena: Secondary | ICD-10-CM

## 2022-02-18 DIAGNOSIS — Z85841 Personal history of malignant neoplasm of brain: Secondary | ICD-10-CM | POA: Diagnosis not present

## 2022-02-18 DIAGNOSIS — R109 Unspecified abdominal pain: Secondary | ICD-10-CM | POA: Diagnosis present

## 2022-02-18 LAB — COMPREHENSIVE METABOLIC PANEL
ALT: 23 U/L (ref 0–44)
AST: 19 U/L (ref 15–41)
Albumin: 4.7 g/dL (ref 3.5–5.0)
Alkaline Phosphatase: 90 U/L (ref 38–126)
Anion gap: 6 (ref 5–15)
BUN: 14 mg/dL (ref 6–20)
CO2: 25 mmol/L (ref 22–32)
Calcium: 9.3 mg/dL (ref 8.9–10.3)
Chloride: 106 mmol/L (ref 98–111)
Creatinine, Ser: 0.43 mg/dL — ABNORMAL LOW (ref 0.44–1.00)
GFR, Estimated: >60 mL/min (ref >60)
Glucose, Bld: 91 mg/dL (ref 70–99)
Potassium: 4 mmol/L (ref 3.5–5.1)
Sodium: 137 mmol/L (ref 135–145)
Total Bilirubin: 0.7 mg/dL (ref 0.3–1.2)
Total Protein: 8 g/dL (ref 6.5–8.1)

## 2022-02-18 LAB — CBC WITH DIFFERENTIAL/PLATELET
Abs Immature Granulocytes: 0.02 10*3/uL (ref 0.00–0.07)
Basophils Absolute: 0 10*3/uL (ref 0.0–0.1)
Basophils Relative: 1 %
Eosinophils Absolute: 0.2 10*3/uL (ref 0.0–0.5)
Eosinophils Relative: 3 %
HCT: 40.8 % (ref 36.0–46.0)
Hemoglobin: 14 g/dL (ref 12.0–15.0)
Immature Granulocytes: 0 %
Lymphocytes Relative: 17 %
Lymphs Abs: 1 10*3/uL (ref 0.7–4.0)
MCH: 31.1 pg (ref 26.0–34.0)
MCHC: 34.3 g/dL (ref 30.0–36.0)
MCV: 90.7 fL (ref 80.0–100.0)
Monocytes Absolute: 0.5 10*3/uL (ref 0.1–1.0)
Monocytes Relative: 9 %
Neutro Abs: 4.3 10*3/uL (ref 1.7–7.7)
Neutrophils Relative %: 70 %
Platelets: 273 10*3/uL (ref 150–400)
RBC: 4.5 MIL/uL (ref 3.87–5.11)
RDW: 12.3 % (ref 11.5–15.5)
WBC: 6.1 10*3/uL (ref 4.0–10.5)
nRBC: 0 % (ref 0.0–0.2)

## 2022-02-18 LAB — SAMPLE TO BLOOD BANK

## 2022-02-18 LAB — LIPASE, BLOOD: Lipase: 36 U/L (ref 11–51)

## 2022-02-18 MED ORDER — ACETAMINOPHEN 500 MG PO TABS
1000.0000 mg | ORAL_TABLET | Freq: Once | ORAL | Status: AC
  Administered 2022-02-18: 1000 mg via ORAL
  Filled 2022-02-18: qty 2

## 2022-02-18 NOTE — ED Provider Notes (Addendum)
? ?[facility] ?Provider Note ? ? ? Event Date/Time  ? First MD Initiated Contact with Patient 02/18/22 2117   ?  (approximate) ? ? ?History  ? ?Rectal Bleeding ? ? ?HPI ? ?Denise Bryan is a 36 y.o. female   with history of brain cancer status postchemotherapy and radiation not currently on treatment but gets close monitoring who now comes in with concerns for rectal bleeding.  Patient reports having multiple episodes of bright blood per rectum with some clots that have been going on for the past 3 to 4 weeks.  She reports some abdominal cramping when it happens.  Mostly on the left side.  She otherwise denies any fevers, passing out or other concerns.  Never has had a colonoscopy before.  She denies any black tarry stool. ? ?Physical Exam  ? ?Triage Vital Signs: ?ED Triage Vitals  ?Enc Vitals Group  ?   BP 02/18/22 1815 (!) 126/96  ?   Pulse Rate 02/18/22 1815 80  ?   Resp 02/18/22 1815 18  ?   Temp 02/18/22 1815 98.7 ?F (37.1 ?C)  ?   Temp src --   ?   SpO2 02/18/22 1815 97 %  ?   Weight --   ?   Height --   ?   Head Circumference --   ?   Peak Flow --   ?   Pain Score 02/18/22 1814 8  ?   Pain Loc --   ?   Pain Edu? --   ?   Excl. in [place]? --   ? ? ?Most recent vital signs: ?Vitals:  ? 02/18/22 1815  ?BP: (!) 126/96  ?Pulse: 80  ?Resp: 18  ?Temp: 98.7 ?F (37.1 ?C)  ?SpO2: 97%  ? ? ? ?General: Awake, no distress.  ?CV:  Good peripheral perfusion.  ?Resp:  Normal effort.  ?Abd:  No distention.  Soft nontender ?Other:  Rectal exam without any stool noted but no gross blood ? ? ?ED Results / Procedures / Treatments  ? ?Labs ?(all labs ordered are listed, but only abnormal results are displayed) ?Labs Reviewed  ?COMPREHENSIVE METABOLIC PANEL - Abnormal; Notable for the following components:  ?    Result Value  ? Creatinine, Ser 0.43 (*)   ? All other components within normal limits  ?CBC WITH DIFFERENTIAL/PLATELET  ?LIPASE, BLOOD  ?URINALYSIS, ROUTINE W REFLEX MICROSCOPIC  ?PROTIME-INR   ?APTT  ?SAMPLE TO BLOOD BANK  ? ? ?RADIOLOGY ?I have reviewed the CT personally and was negative ? ? ?PROCEDURES: ? ?Critical Care performed: No ? ?Procedures ? ? ?MEDICATIONS ORDERED IN ED: ?Medications - No data to display ? ? ?IMPRESSION / MDM / ASSESSMENT AND PLAN / ED COURSE  ?I reviewed the triage vital signs and the nursing notes. ?             ?               ? ?Patient came in with concern for rectal bleeding and some abdominal pain.  CT ordered from triage.  CBC ordered to evaluate for anemia.  Tylenol ordered ? ?CT reassuring without any evidence of abscess, diverticulitis.  CBC normal with normal hemoglobin no signs of anemia.  CMP normal.  Lipase normal. ? ?Patient had a hysterectomy. ? ?Rectal exam with no gross blood at this time.  Given this been ongoing for a few weeks and her hemoglobin is stable discussed that patient needs close outpatient follow-up with GI  for colonoscopy to rule out a mass versus hemorrhoid versus diverticulosis.  She expressed understanding felt comfortable with discharge home and understands return cautions in regards to worsening bleeding, worsening pain, lightheadedness that she can always return for recheck hemoglobin ? ?Considered admission given patient came in with rectal bleeding but given no active bleeding at this time hemoglobin stable patient can follow-up outpatient ? ?Patient has not given a urine but denies any urinary symptoms and so is okay without having it done. ? ?I discussed the provisional nature of ED diagnosis, the treatment so far, the ongoing plan of care, follow up appointments and return precautions with the patient and any family or support people present. They expressed understanding and agreed with the plan, discharged home. ? ? ? ? ? ? ?FINAL CLINICAL IMPRESSION(S) / ED DIAGNOSES  ? ?Final diagnoses:  ?Hematochezia  ? ? ? ?Rx / DC Orders  ? ?ED Discharge Orders   ? ? None  ? ?  ? ? ? ?Note:  This document was prepared using Dragon voice  recognition software and may include unintentional dictation errors. ?  ?Vanessa Empire, MD ?02/18/22 2154 ? ?  ?Vanessa Lyons, MD ?02/18/22 2155 ? ?

## 2022-02-18 NOTE — Discharge Instructions (Signed)
Take Tylenol 1 g every 8 hours to help with any abdominal pain and call the GI number to get a follow-up or return to the ER for repeat evaluation if things are getting worse, with worsening bleeding.  Given your history you most likely need colonoscopy to evaluate for any hemorrhoids, mass, diverticulosis or other causes of bleeding ?

## 2022-02-18 NOTE — ED Provider Triage Note (Signed)
Emergency Medicine Provider Triage Evaluation Note ? ?Denise Bryan , a 36 y.o. female  was evaluated in triage.  Pt complains of who presents the emergency department complaining of lower abdominal pain and rectal bleeding.  She states that she does have a history of chemo and radiation for brain tumor.  She has been a year without treatment.  Started with rectal bleeding that she states as a clot but is bright bleeding.  No rectal pain but is having lower abdominal pain worse on the left side.  No history of Crohn's, colitis, IBS. ? ?Review of Systems  ?Positive: Abdominal pain and rectal bleeding ?Negative: Urinary changes, fevers or chills, emesis ? ?Physical Exam  ?BP (!) 126/96   Pulse 80   Temp 98.7 ?F (37.1 ?C)   Resp 18   LMP 08/18/2018   SpO2 97%  ?Gen:   Awake, no distress   ?Resp:  Normal effort  ?MSK:   Moves extremities without difficulty  ?Other:  tender to palpation left lower quadrant ? ?Medical Decision Making  ?Medically screening exam initiated at 6:23 PM.  Appropriate orders placed.  Denise Bryan was informed that the remainder of the evaluation will be completed by another provider, this initial triage assessment does not replace that evaluation, and the importance of remaining in the ED until their evaluation is complete. ? ?Patient presents with rectal bleeding with left-sided lower quadrant tenderness to palpation.  Patient will have labs, urinalysis, imaging at this time. ?  ?Darletta Moll, PA-C ?02/18/22 1823 ? ?

## 2022-02-18 NOTE — ED Triage Notes (Addendum)
Pt comes with c/o rectal bleeding for over week now. Pt states large clots and bright red. Pt denies any hx of this.  ? ? ? ?Pt states abdominal pain and some nausea. ? ?Pt states she does have brain tumor. Pt states last chemo year ago. Pt states scheduled MRI every 4 months. ?

## 2023-02-08 ENCOUNTER — Emergency Department: Payer: Medicaid Other

## 2023-02-08 ENCOUNTER — Other Ambulatory Visit: Payer: Self-pay

## 2023-02-08 ENCOUNTER — Observation Stay
Admission: EM | Admit: 2023-02-08 | Discharge: 2023-02-11 | Disposition: A | Discharge status: Home / Self Care | Payer: Medicaid Other | Attending: Internal Medicine | Admitting: Internal Medicine

## 2023-02-08 DIAGNOSIS — M7989 Other specified soft tissue disorders: Secondary | ICD-10-CM | POA: Insufficient documentation

## 2023-02-08 DIAGNOSIS — R0609 Other forms of dyspnea: Principal | ICD-10-CM | POA: Insufficient documentation

## 2023-02-08 DIAGNOSIS — R0600 Dyspnea, unspecified: Secondary | ICD-10-CM | POA: Diagnosis not present

## 2023-02-08 DIAGNOSIS — Z85841 Personal history of malignant neoplasm of brain: Secondary | ICD-10-CM | POA: Insufficient documentation

## 2023-02-08 DIAGNOSIS — R635 Abnormal weight gain: Secondary | ICD-10-CM | POA: Diagnosis not present

## 2023-02-08 DIAGNOSIS — C719 Malignant neoplasm of brain, unspecified: Secondary | ICD-10-CM

## 2023-02-08 DIAGNOSIS — F419 Anxiety disorder, unspecified: Secondary | ICD-10-CM | POA: Diagnosis not present

## 2023-02-08 DIAGNOSIS — R7989 Other specified abnormal findings of blood chemistry: Secondary | ICD-10-CM | POA: Insufficient documentation

## 2023-02-08 LAB — COMPREHENSIVE METABOLIC PANEL
ALT: 33 U/L (ref 0–44)
AST: 27 U/L (ref 15–41)
Albumin: 3.8 g/dL (ref 3.5–5.0)
Alkaline Phosphatase: 85 U/L (ref 38–126)
Anion gap: 5 (ref 5–15)
BUN: 12 mg/dL (ref 6–20)
CO2: 24 mmol/L (ref 22–32)
Calcium: 8.9 mg/dL (ref 8.9–10.3)
Chloride: 110 mmol/L (ref 98–111)
Creatinine, Ser: 0.54 mg/dL (ref 0.44–1.00)
GFR, Estimated: >60 mL/min (ref >60)
Glucose, Bld: 94 mg/dL (ref 70–99)
Potassium: 3.8 mmol/L (ref 3.5–5.1)
Sodium: 139 mmol/L (ref 135–145)
Total Bilirubin: 0.5 mg/dL (ref 0.3–1.2)
Total Protein: 6.8 g/dL (ref 6.5–8.1)

## 2023-02-08 LAB — TROPONIN I (HIGH SENSITIVITY): Troponin I (High Sensitivity): 2 ng/L (ref <18)

## 2023-02-08 LAB — T4, FREE: Free T4: 0.54 ng/dL — ABNORMAL LOW (ref 0.61–1.12)

## 2023-02-08 LAB — BRAIN NATRIURETIC PEPTIDE: B Natriuretic Peptide: 212 pg/mL — ABNORMAL HIGH (ref 0.0–100.0)

## 2023-02-08 LAB — D-DIMER, QUANTITATIVE: D-Dimer, Quant: 0.51 ug/mL-FEU — ABNORMAL HIGH (ref 0.00–0.50)

## 2023-02-08 LAB — TSH: TSH: 2.943 u[IU]/mL (ref 0.350–4.500)

## 2023-02-08 MED ORDER — KETOCONAZOLE 2 % EX SHAM
1.0000 | MEDICATED_SHAMPOO | CUTANEOUS | Status: DC
  Administered 2023-02-10: 1 via TOPICAL
  Filled 2023-02-08: qty 120

## 2023-02-08 MED ORDER — ONDANSETRON HCL 4 MG PO TABS: 4.0000 mg | ORAL_TABLET | Freq: Four times a day (QID) | ORAL | Status: DC | PRN

## 2023-02-08 MED ORDER — OXYCODONE HCL 5 MG PO TABS
5.0000 mg | ORAL_TABLET | Freq: Three times a day (TID) | ORAL | Status: DC | PRN
  Administered 2023-02-10: 5 mg via ORAL
  Filled 2023-02-08 (×2): qty 1

## 2023-02-08 MED ORDER — DIAZEPAM 5 MG PO TABS: 5.0000 mg | ORAL_TABLET | ORAL | Status: DC

## 2023-02-08 MED ORDER — ONDANSETRON HCL 4 MG/2ML IJ SOLN: 4.0000 mg | Freq: Four times a day (QID) | INTRAMUSCULAR | Status: DC | PRN

## 2023-02-08 MED ORDER — TRAZODONE HCL 50 MG PO TABS
25.0000 mg | ORAL_TABLET | Freq: Every evening | ORAL | Status: DC | PRN
  Administered 2023-02-09 – 2023-02-10 (×2): 25 mg via ORAL
  Filled 2023-02-08 (×2): qty 1

## 2023-02-08 MED ORDER — METOLAZONE 5 MG PO TABS: 5.0000 mg | ORAL_TABLET | Freq: Every day | ORAL | Status: DC

## 2023-02-08 MED ORDER — POTASSIUM CHLORIDE CRYS ER 20 MEQ PO TBCR: 20.0000 meq | EXTENDED_RELEASE_TABLET | Freq: Two times a day (BID) | ORAL | Status: DC

## 2023-02-08 MED ORDER — ACETAMINOPHEN 325 MG PO TABS: 650.0000 mg | ORAL_TABLET | Freq: Four times a day (QID) | ORAL | Status: DC | PRN

## 2023-02-08 MED ORDER — MAGNESIUM HYDROXIDE 400 MG/5ML PO SUSP: 30.0000 mL | Freq: Every day | ORAL | Status: DC | PRN

## 2023-02-08 MED ORDER — ACETAMINOPHEN 650 MG RE SUPP: 650.0000 mg | Freq: Four times a day (QID) | RECTAL | Status: DC | PRN

## 2023-02-08 MED ORDER — FUROSEMIDE 40 MG PO TABS: 40.0000 mg | ORAL_TABLET | Freq: Every day | ORAL | Status: DC

## 2023-02-08 MED ORDER — ENOXAPARIN SODIUM 60 MG/0.6ML IJ SOSY
0.5000 mg/kg | PREFILLED_SYRINGE | INTRAMUSCULAR | Status: DC
  Administered 2023-02-09: 45 mg via SUBCUTANEOUS
  Filled 2023-02-08: qty 0.6

## 2023-02-08 NOTE — ED Provider Notes (Signed)
[facility] Provider Note    Event Date/Time   First MD Initiated Contact with Patient 02/08/23 2025     (approximate)   History   Leg Swelling   HPI  Denise Bryan is a 37 y.o. 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  She denies respiratory infectious symptoms.  No history of blood clots.  She has an echocardiogram that is upcoming.  She has tried a number of diuretics and was recently started on Lasix but states that she has allergic reaction to 1 of these medications but she is not sure which.   She denies chest pain.  She has been worked up by her PCP and she tells me that they have echocardiogram upcoming as well as wanting to scan her for pulmonary embolism    External Medical Documents Reviewed:  Oncology note 02/04/2023-- Denotes Oncologic history w no current treatment as well as description of new symptoms of swelling- New symptoms - overall swelling in body and face, chest pain, SOB, heart palpitations - had work up with PCP; Elmo Putt RN will reach out to PCP for results of work up that was completed - recently saw [place] [facility] - prescribed pt lasix 40mg  daily, which she is currently taking - prescribed KCl and metolazone but pt said these worsened her sxs, and since she couldn't tell which one actually did it, she stopped both - pending cardiac echo this month and BLE VD next month - will also place referral for endocrinology to assist in evaluation of patient       Physical Exam   Triage Vital Signs: ED Triage Vitals [02/08/23 2001]  Enc Vitals Group     BP 139/88     Pulse Rate 74     Resp 18     Temp 98.1 F (36.7 C)     Temp Source Oral     SpO2 99 %     Weight 202 lb  (91.6 kg)     Height 5\' 3"  (1.6 m)     Head Circumference      Peak Flow      Pain Score 0     Pain Loc      Pain Edu?      Excl. in [place]?     Most recent vital signs: Vitals:   02/08/23 2001 02/08/23 2031  BP: 139/88 94/71  Pulse: 74 66  Resp: 18 16  Temp: 98.1 F (36.7 C) 98.1 F (36.7 C)  SpO2: 99% 100%    General: Awake, no distress.  CV:  Good peripheral perfusion.  Resp:  Normal effort.  Abd:  No distention.  Other:  She has swelling diffusely, she states which has been progressively worsening over 1 year.  Pedal pulses intact abdomen soft nontender lungs clear without obvious rales or focalities.   ED Results / Procedures / Treatments   Labs (all labs ordered are listed, but only abnormal results are displayed) Labs Reviewed  BRAIN NATRIURETIC PEPTIDE - Abnormal; Notable for the following components:      Result Value   B Natriuretic Peptide 212.0 (*)    All other components within normal limits  D-DIMER, QUANTITATIVE - Abnormal; Notable for the following components:   D-Dimer, Quant 0.51 (*)    All other  components within normal limits  COMPREHENSIVE METABOLIC PANEL  CBC WITH DIFFERENTIAL/PLATELET  TSH  T4, FREE  TROPONIN I (HIGH SENSITIVITY)     I ordered and reviewed the above labs they are notable for negative initial troponin and her BNP is 212  EKG  ED ECG REPORT I, Lucillie Garfinkel, the attending physician, personally viewed and interpreted this ECG.   Date: 02/08/2023  EKG Time: 2004  Rate: 72  Rhythm: NSR  Axis: nl  Intervals: none  ST&T Change: no acute ischemic changes    RADIOLOGY I independently reviewed and interpreted CXR and see no obvious focality or pneumothorax   PROCEDURES:  Critical Care performed: No  Procedures   MEDICATIONS ORDERED IN ED: Medications - No data to display  External physician / consultants:  I spoke with hospitalist for admission and regarding care plan for this patient.   IMPRESSION / MDM /  ASSESSMENT AND PLAN / ED COURSE  I reviewed the triage vital signs and the nursing notes.                                Patient's presentation is most consistent with acute presentation with potential threat to life or bodily function.  Differential diagnosis includes, but is not limited to, heart failure exacerbation, PE, electrolyte disturbance, thyroid dysfunction   The patient is on the cardiac monitor to evaluate for evidence of arrhythmia and/or significant heart rate changes.  MDM: This patient with 1 year of chronic worsening edema to her whole body but more importantly she has been having some exertional dyspnea worsening over the last several days.  With her history of cancer now not actively treated but undergoing monitoring, wonder if she has a blood clot.  Her primary doctor has been trying to workup for this as well.  Her dimer is slightly elevated and her DVT scan shows no clots in the legs.  She has a anaphylaxis reaction to iodine and now avoid IV contrast for CT angiogram instead admit her for VQ scan.         FINAL CLINICAL IMPRESSION(S) / ED DIAGNOSES   Final diagnoses:  Leg swelling  Exertional dyspnea     Rx / DC Orders   ED Discharge Orders     None        Note:  This document was prepared using Dragon voice recognition software and may include unintentional dictation errors.    Lucillie Garfinkel, MD 02/08/23 2239

## 2023-02-08 NOTE — ED Notes (Addendum)
Called to rec RN Levada Dy. Questions answered. Ok for pt to come to floor.

## 2023-02-08 NOTE — ED Notes (Signed)
ED Provider at bedside. 

## 2023-02-08 NOTE — ED Triage Notes (Signed)
Pt c/o swelling in legs face hands, and difficulty breathing for over a month. Pt is AOX4, significant non pitting edema in legs bilaterally noted, hands and face appears swollen. Pt denies CP, dizziness n/v- states she has a heart ultrasound scheduled next week but cannot wait. Respirations even and unlabored- pt reports orthopnea.

## 2023-02-08 NOTE — H&P (Signed)
[place]   PATIENT NAME: Denise Bryan    MR#:  [id number]  DATE OF BIRTH:  [birth year]  DATE OF ADMISSION:  02/08/2023  PRIMARY CARE PHYSICIAN: [facility] [place]   Patient is coming from: Home  REQUESTING/REFERRING PHYSICIAN: Lucillie Garfinkel, MD  CHIEF COMPLAINT:   Chief Complaint  Patient presents with   Leg Swelling    HISTORY OF PRESENT ILLNESS:  Denise Bryan is a 37 y.o. Caucasian female with medical history significant for medullary ependymoma, anxiety, endometriosis, and recent abnormal weight gain, who presented to the emergency room with acute onset of dyspnea on exertion and whole body swelling.  She gained about 64 pounds over the last 3 months.  She had outpatient work By her PCP.  She was initially given a prescription for Lasix 40 mg p.o. daily as it was later seen by her [facility] clinic cardiologist who added Zaroxolyn and potassium chloride.  After taking them she experienced dyspnea and presyncope and pallor and was advised to stop them.  She was then prescribed p.o. torsemide that she has not started yet as it was not available at her pharmacy.  She talked to her doctor at [facility] today and given her symptoms she was advised to come to the ER.  No cough or wheezing or hemoptysis.  No leg pain however she has been having worsening lower extremity edema.  No fever or chills.  She admits to chest tightness that increases with deep breathing.  No radiation of her pain or nausea or vomiting or diaphoresis.  ED Course: Upon presentation to the ER, vital signs were within normal with pulse oximetry 99% on room air.  Labs revealed unremarkable CMP.  BNP was 212 with high sensitive troponin I of 2.  CBC was within normal.  TSH was 2.94 and free T4 was 0.54 with normal ranging from 0.61-1.12.  D-dimer came back 0.51.  EKG as reviewed by me : EKG showed normal sinus rhythm with a rate of 72.. Imaging: Two-view chest x-ray showed no  acute cardiopulmonary disease.  Bilateral lower extremity venous Doppler came back negative for DVT.  The patient will be admitted to a medical telemetry observation bed for further evaluation and management. PAST MEDICAL HISTORY:   Past Medical History:  Diagnosis Date   Anxiety    Brain cancer ([place])    Dyspnea    Endometriosis    Family history of ovarian cancer    Gastroesophageal reflux disease 09/20/2008   no problems since gallbladder taken out!! 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[place])    Neuromuscular disorder ([place])    Spontaneous abortion    Unspecified blood type, rh negative   -Medullary ependymoma  PAST SURGICAL HISTORY:   Past Surgical History:  Procedure Laterality Date   BRAIN TUMOR EXCISION     CHOLECYSTECTOMY  2007   CYSTOSCOPY N/A 09/01/2018   Procedure: CYSTOSCOPY;  Surgeon: Will Bonnet, MD;  Location: [facility] ORS;  Service: Gynecology;  Laterality: N/A;   DIAGNOSTIC LAPAROSCOPY  04/26/2010   IUD EXPULSION/PELVIC PAIN   DILATION AND CURETTAGE OF UTERUS  08/10/10; 08/06/07   HYDATIIDIFORM MOLE/POC, POC   DIRECT LARYNGOSCOPY N/A 10/10/2020   Procedure: INJECTION LARYNGOPLASTY C VOICE GEL, LEFT ONE VOCAL FOLD;  Surgeon: Carloyn Manner,  MD;  Location: [facility] ORS;  Service: ENT;  Laterality: N/A;   gallstones  08/05/2006   HYSTEROSCOPY  04/26/2010   IUD EXPULSION [place] N/A 09/01/2018   Procedure: HYSTERECTOMY TOTAL LAPAROSCOPIC BILATERAL SALPINGECTOMY;  Surgeon: Will Bonnet, MD;  Location: [facility] ORS;  Service: Gynecology;  Laterality: N/A;   LAPAROSCOPY N/A 08/21/2017   Procedure: LAPAROSCOPY DIAGNOSTIC;  Surgeon: Will Bonnet, MD;  Location: [facility] ORS;  Service: Gynecology;  Laterality: N/A;   LAPAROSCOPY N/A 07/30/2018   Procedure: LAPAROSCOPY  DIAGNOSTIC WITH BILATERAL SALPINGECTOMY;  Surgeon: Will Bonnet, MD;  Location: [facility] ORS;  Service: Gynecology;  Laterality: N/A;   SPINAL FUSION  2021   TUBAL LIGATION  01/2012   SDJ    SOCIAL HISTORY:   Social History   Tobacco Use   Smoking status: Never   Smokeless tobacco: Never  Substance Use Topics   Alcohol use: No    FAMILY HISTORY:   Family History  Problem Relation Age of Onset   Diabetes Mother    Hypertension Mother    Cancer Mother 15       UTERINE   Colon cancer Mother    Cancer Sister 39       CX   Diabetes Maternal Uncle    Diabetes Maternal Grandmother    Hypertension Maternal Grandmother    Lung disease Maternal Grandfather    Cancer Maternal Grandfather        LUNG   Heart disease Paternal Grandmother    Hypertension Paternal Grandmother     DRUG ALLERGIES:   Allergies  Allergen Reactions   Eicosapentaenoic Acid (Epa) Anaphylaxis   Fish-Derived Products Anaphylaxis   Iodine Anaphylaxis   Tramadol Anaphylaxis and Hives    REVIEW OF SYSTEMS:   ROS As per history of present illness. All pertinent systems were reviewed above. Constitutional, HEENT, cardiovascular, respiratory, GI, GU, musculoskeletal, neuro, psychiatric, endocrine, integumentary and hematologic systems were reviewed and are otherwise negative/unremarkable except for positive findings mentioned above in the HPI.   MEDICATIONS AT HOME:   Prior to Admission medications   Medication Sig Start Date End Date Taking? Authorizing Provider  furosemide (LASIX) 40 MG tablet Take 40 mg by mouth daily. 01/22/23  Yes [provider]  ketoconazole (NIZORAL) 2 % shampoo Apply 1 Application topically 2 (two) times a week. 01/22/23  Yes [provider]  oxyCODONE (OXY IR/ROXICODONE) 5 MG immediate release tablet Take 5 mg by mouth every 8 (eight) hours as needed. 01/22/23  Yes [provider]  torsemide (DEMADEX) 20 MG tablet Take 20 mg by mouth daily. 02/06/23  02/06/24 Yes [provider]  diazepam (VALIUM) 5 MG tablet Take one tablet (5mg ) 30-60 minutes prior to MRI. If still anxious immediately prior to MRI, then you can take a second tablet (5mg ). Do not drive or operate heavy machinery for a minimum of 5-7 hours after taking diazepam (valium). Patient not taking: Reported on 02/08/2023 02/03/23   [provider]  metolazone (ZAROXOLYN) 5 MG tablet Take 5 mg by mouth daily. Patient not taking: Reported on 02/08/2023 01/29/23   [provider]  potassium chloride SA (KLOR-CON M) 20 MEQ tablet Take 1 tablet by mouth 2 (two) times daily. Patient not taking: Reported on 02/08/2023 01/28/23 01/28/24  [provider]      VITAL SIGNS:  Blood pressure (!) 141/78, pulse 70, temperature 98.8 F (37.1 C), temperature source Oral, resp. rate 20, height 5\' 3"  (1.6 m), weight 91.6 kg,  last menstrual period 08/18/2018, SpO2 99 %, unknown if currently breastfeeding.  PHYSICAL EXAMINATION:  Physical Exam  GENERAL:  37 y.o.-year-old patient lying in the bed with no acute distress.  EYES: Pupils equal, round, reactive to light and accommodation. No scleral icterus. Extraocular muscles intact.  HEENT: Head atraumatic, normocephalic. Oropharynx and nasopharynx clear.  NECK:  Supple, no jugular venous distention. No thyroid enlargement, no tenderness.  LUNGS: Normal breath sounds bilaterally, no wheezing, rales,rhonchi or crepitation. No use of accessory muscles of respiration.  CARDIOVASCULAR: Regular rate and rhythm, S1, S2 normal. No murmurs, rubs, or gallops.  ABDOMEN: Soft, nondistended, nontender. Bowel sounds present. No organomegaly or mass.  EXTREMITIES: 1+ lower extremity pitting edema, with no cyanosis, or clubbing.  NEUROLOGIC: Cranial nerves II through XII are intact. Muscle strength 5/5 in all extremities. Sensation intact. Gait not checked.  PSYCHIATRIC: The patient is alert and oriented x 3.  Normal affect and good eye  contact. SKIN: No obvious rash, lesion, or ulcer.   LABORATORY PANEL:   CBC showed WBC of 7, hemoglobin 13.4 and hematocrit 38 with platelets of 264.  ------------------------------------------------------------------------------------------------------------------  Chemistries  Recent Labs  Lab 02/08/23 2008  NA 139  K 3.8  CL 110  CO2 24  GLUCOSE 94  BUN 12  CREATININE 0.54  CALCIUM 8.9  AST 27  ALT 33  ALKPHOS 85  BILITOT 0.5   ------------------------------------------------------------------------------------------------------------------  Cardiac Enzymes No results for input(s): "TROPONINI" in the last 168 hours. ------------------------------------------------------------------------------------------------------------------  RADIOLOGY:  US Venous Img Lower Bilateral (DVT)  Result Date: 02/08/2023 CLINICAL DATA:  Lower extremity swelling EXAM: BILATERAL LOWER EXTREMITY VENOUS DOPPLER ULTRASOUND TECHNIQUE: Gray-scale sonography with graded compression, as well as color Doppler and duplex ultrasound were performed to evaluate the lower extremity deep venous systems from the level of the common femoral vein and including the common femoral, femoral, profunda femoral, popliteal and calf veins including the posterior tibial, peroneal and gastrocnemius veins when visible. The superficial great saphenous vein was also interrogated. Spectral Doppler was utilized to evaluate flow at rest and with distal augmentation maneuvers in the common femoral, femoral and popliteal veins. COMPARISON:  04/01/2015 FINDINGS: RIGHT LOWER EXTREMITY Common Femoral Vein: No evidence of thrombus. Normal compressibility, respiratory phasicity and response to augmentation. Saphenofemoral Junction: No evidence of thrombus. Normal compressibility and flow on color Doppler imaging. Profunda Femoral Vein: No evidence of thrombus. Normal compressibility and flow on color Doppler imaging. Femoral Vein: No  evidence of thrombus. Normal compressibility, respiratory phasicity and response to augmentation. Popliteal Vein: No evidence of thrombus. Normal compressibility, respiratory phasicity and response to augmentation. Calf Veins: Not well visualized Superficial Great Saphenous Vein: No evidence of thrombus. Normal compressibility. Venous Reflux:  None. Other Findings:  None. LEFT LOWER EXTREMITY Common Femoral Vein: No evidence of thrombus. Normal compressibility, respiratory phasicity and response to augmentation. Saphenofemoral Junction: No evidence of thrombus. Normal compressibility and flow on color Doppler imaging. Profunda Femoral Vein: No evidence of thrombus. Normal compressibility and flow on color Doppler imaging. Femoral Vein: No evidence of thrombus. Normal compressibility, respiratory phasicity and response to augmentation. Popliteal Vein: No evidence of thrombus. Normal compressibility, respiratory phasicity and response to augmentation. Calf Veins: Not well visualized Superficial Great Saphenous Vein: No evidence of thrombus. Normal compressibility. Venous Reflux:  None. Other Findings:  2.8 cm popliteal fossa collection is noted. IMPRESSION: No evidence of deep venous thrombosis in either lower extremity. Calf veins were not well seen. Left popliteal cyst. Electronically Signed   By: Linus Mako.D.  On: 02/08/2023 22:28   DG Chest 2 View  Result Date: 02/08/2023 CLINICAL DATA:  Shortness of breath. Swelling in the legs, face, and hands. Difficulty breathing for a month. EXAM: CHEST - 2 VIEW COMPARISON:  11/05/2016 FINDINGS: Heart size and pulmonary vascularity are normal. Lungs are clear. No pleural effusions. No pneumothorax. Mediastinal contours appear intact. Postoperative changes in the cervical spine. Surgical clips in the right upper quadrant. IMPRESSION: No active cardiopulmonary disease. Electronically Signed   By: Lucienne Capers M.D.   On: 02/08/2023 20:22      IMPRESSION AND  PLAN:  Assessment and Plan: * Dyspnea - She has slightly elevated D-dimer.  Will need to rule out acute pulmonary embolism. - The patient will be admitted to a medical telemetry observation bed. - Will obtain a VQ scan to rule out PE.  The patient has anaphylactic reaction to iodine. - She has no current O2 requirement.  Pulse oximetry will be followed while she is here. - I will hold off IV heparin given her negative bilateral lower extremity venous Doppler.  Abnormal weight gain - She has a slightly elevated BNP however no pulmonary edema on chest x-ray. - We will hold off on diuretic therapy for now. - She stated that she had a 2D echo at [place] [facility] on Wednesday. - Cardiology consult to be obtained as mentioned above  [facility] - This involves her medulla and spinal cord. - She is followed at [facility] for it.  No current neurological deficits and no worsening headaches.  Anxiety - We will continue her Valium.    DVT prophylaxis: Lovenox.  Advanced Care Planning:  Code Status: full code.  Family Communication:  The plan of care was discussed in details with the patient (and family). I answered all questions. The patient agreed to proceed with the above mentioned plan. Further management will depend upon hospital course. Disposition Plan: Back to previous home environment Consults called: Cardiology.  All the records are reviewed and case discussed with ED provider.  Status is: Observation  I certify that at the time of admission, it is my clinical judgment that the patient will require  hospital care extending last than 2 midnights.                            Dispo: The patient is from: Home              Anticipated d/c is to: Home              Patient currently is not medically stable to d/c.              Difficult to place patient: No  Christel Mormon M.D on 02/09/2023 at 1:14 AM  [facility]   From 7 PM-7 AM, contact  night-coverage [email or web address]  CC: Primary care physician; [facility] [place]

## 2023-02-08 NOTE — Progress Notes (Signed)
PHARMACIST - PHYSICIAN COMMUNICATION  CONCERNING:  Enoxaparin (Lovenox) for DVT Prophylaxis    RECOMMENDATION: Patient was prescribed enoxaprin 40mg  q24 hours for VTE prophylaxis.   Filed Weights   02/08/23 2001  Weight: 91.6 kg (202 lb)    Body mass index is 35.78 kg/m.  Estimated Creatinine Clearance: 106.5 mL/min (by C-G formula based on SCr of 0.54 mg/dL).   Based on [place] patient is candidate for enoxaparin 0.5mg /kg TBW SQ every 24 hours based on BMI being >30.  DESCRIPTION: Pharmacy has adjusted enoxaparin dose per [facility] policy.  Patient is now receiving enoxaparin 0.5 mg/kg every 24 hours   Renda Rolls, PharmD, [facility] 02/08/2023 11:09 PM

## 2023-02-09 ENCOUNTER — Observation Stay
Admit: 2023-02-09 | Discharge: 2023-02-09 | Disposition: A | Discharge status: Home / Self Care | Payer: Medicaid Other | Attending: Cardiology | Admitting: Cardiology

## 2023-02-09 DIAGNOSIS — F419 Anxiety disorder, unspecified: Secondary | ICD-10-CM | POA: Insufficient documentation

## 2023-02-09 DIAGNOSIS — C719 Malignant neoplasm of brain, unspecified: Secondary | ICD-10-CM | POA: Diagnosis not present

## 2023-02-09 DIAGNOSIS — R0600 Dyspnea, unspecified: Secondary | ICD-10-CM | POA: Diagnosis not present

## 2023-02-09 DIAGNOSIS — R635 Abnormal weight gain: Secondary | ICD-10-CM | POA: Diagnosis not present

## 2023-02-09 LAB — CBC
HCT: 35.8 % — ABNORMAL LOW (ref 36.0–46.0)
Hemoglobin: 12.6 g/dL (ref 12.0–15.0)
MCH: 31.6 pg (ref 26.0–34.0)
MCHC: 35.2 g/dL (ref 30.0–36.0)
MCV: 89.7 fL (ref 80.0–100.0)
Platelets: 252 10*3/uL (ref 150–400)
RBC: 3.99 MIL/uL (ref 3.87–5.11)
RDW: 12.3 % (ref 11.5–15.5)
WBC: 5.1 10*3/uL (ref 4.0–10.5)
nRBC: 0 % (ref 0.0–0.2)

## 2023-02-09 LAB — CBC WITH DIFFERENTIAL/PLATELET
Abs Immature Granulocytes: 0.01 10*3/uL (ref 0.00–0.07)
Basophils Absolute: 0 10*3/uL (ref 0.0–0.1)
Basophils Relative: 0 %
Eosinophils Absolute: 0.2 10*3/uL (ref 0.0–0.5)
Eosinophils Relative: 3 %
HCT: 38 % (ref 36.0–46.0)
Hemoglobin: 13.4 g/dL (ref 12.0–15.0)
Immature Granulocytes: 0 %
Lymphocytes Relative: 24 %
Lymphs Abs: 1.7 10*3/uL (ref 0.7–4.0)
MCH: 31.6 pg (ref 26.0–34.0)
MCHC: 35.3 g/dL (ref 30.0–36.0)
MCV: 89.6 fL (ref 80.0–100.0)
Monocytes Absolute: 0.5 10*3/uL (ref 0.1–1.0)
Monocytes Relative: 8 %
Neutro Abs: 4.6 10*3/uL (ref 1.7–7.7)
Neutrophils Relative %: 65 %
Platelets: 264 10*3/uL (ref 150–400)
RBC: 4.24 MIL/uL (ref 3.87–5.11)
RDW: 12.2 % (ref 11.5–15.5)
WBC: 7 10*3/uL (ref 4.0–10.5)
nRBC: 0 % (ref 0.0–0.2)

## 2023-02-09 LAB — BASIC METABOLIC PANEL
Anion gap: 6 (ref 5–15)
BUN: 11 mg/dL (ref 6–20)
CO2: 22 mmol/L (ref 22–32)
Calcium: 8.4 mg/dL — ABNORMAL LOW (ref 8.9–10.3)
Chloride: 110 mmol/L (ref 98–111)
Creatinine, Ser: 0.56 mg/dL (ref 0.44–1.00)
GFR, Estimated: >60 mL/min (ref >60)
Glucose, Bld: 101 mg/dL — ABNORMAL HIGH (ref 70–99)
Potassium: 3.6 mmol/L (ref 3.5–5.1)
Sodium: 138 mmol/L (ref 135–145)

## 2023-02-09 LAB — ECHOCARDIOGRAM COMPLETE
AR max vel: 2.01 cm2
AV Peak grad: 6 mmHg
Ao pk vel: 1.22 m/s
Area-P 1/2: 3.93 cm2
Height: 63 in
S' Lateral: 3 cm
Weight: 3232 oz

## 2023-02-09 LAB — PROTIME-INR
INR: 1.1 (ref 0.8–1.2)
Prothrombin Time: 13.6 seconds (ref 11.4–15.2)

## 2023-02-09 LAB — HIV ANTIBODY (ROUTINE TESTING W REFLEX): HIV Screen 4th Generation wRfx: NONREACTIVE

## 2023-02-09 MED ORDER — ENOXAPARIN SODIUM 100 MG/ML IJ SOSY
1.0000 mg/kg | PREFILLED_SYRINGE | Freq: Two times a day (BID) | INTRAMUSCULAR | Status: DC
  Administered 2023-02-10: 92.5 mg via SUBCUTANEOUS
  Filled 2023-02-09: qty 0.93

## 2023-02-09 MED ORDER — ENOXAPARIN SODIUM 100 MG/ML IJ SOSY
1.0000 mg/kg | PREFILLED_SYRINGE | Freq: Two times a day (BID) | INTRAMUSCULAR | Status: DC
  Filled 2023-02-09: qty 0.93

## 2023-02-09 MED ORDER — ENOXAPARIN SODIUM 60 MG/0.6ML IJ SOSY
0.5000 mg/kg | PREFILLED_SYRINGE | Freq: Once | INTRAMUSCULAR | Status: AC
  Administered 2023-02-09: 45 mg via SUBCUTANEOUS
  Filled 2023-02-09: qty 0.6

## 2023-02-09 MED ORDER — IBUPROFEN 400 MG PO TABS
400.0000 mg | ORAL_TABLET | Freq: Four times a day (QID) | ORAL | Status: DC | PRN
  Administered 2023-02-09: 400 mg via ORAL

## 2023-02-09 MED ORDER — DM-GUAIFENESIN ER 30-600 MG PO TB12
1.0000 | ORAL_TABLET | Freq: Two times a day (BID) | ORAL | Status: DC
  Administered 2023-02-09 – 2023-02-11 (×5): 1 via ORAL
  Filled 2023-02-09 (×5): qty 1

## 2023-02-09 MED ORDER — ENOXAPARIN SODIUM 60 MG/0.6ML IJ SOSY: 0.5000 mg/kg | PREFILLED_SYRINGE | Freq: Two times a day (BID) | INTRAMUSCULAR | Status: DC

## 2023-02-09 MED ORDER — TORSEMIDE 20 MG PO TABS
20.0000 mg | ORAL_TABLET | Freq: Every day | ORAL | Status: DC
  Administered 2023-02-09 – 2023-02-10 (×2): 20 mg via ORAL
  Filled 2023-02-09 (×2): qty 1

## 2023-02-09 MED ORDER — IBUPROFEN 400 MG PO TABS
200.0000 mg | ORAL_TABLET | Freq: Four times a day (QID) | ORAL | Status: DC | PRN
  Filled 2023-02-09: qty 1

## 2023-02-09 NOTE — Assessment & Plan Note (Addendum)
-   This involves her medulla and spinal cord. - She is followed at [facility] for it.  No current neurological deficits and no worsening headaches.

## 2023-02-09 NOTE — Consult Note (Signed)
ANTICOAGULATION CONSULT NOTE - Initial Consult  Pharmacy Consult for enoxaparin Indication: pulmonary embolus  Allergies  Allergen Reactions   Eicosapentaenoic Acid (Epa) Anaphylaxis   Fish-Derived Products Anaphylaxis   Iodine Anaphylaxis   Tramadol Anaphylaxis and Hives    Patient Measurements: Height: 5\' 3"  (160 cm) Weight: 91.6 kg (202 lb) IBW/kg (Calculated) : 52.4  Vital Signs: Temp: 97.9 F (36.6 C) (03/24 0419) Temp Source: Oral (03/24 0419) BP: 94/51 (03/24 0419) Pulse Rate: 75 (03/24 0419)  Labs: Recent Labs    02/08/23 2008 02/08/23 2357 02/09/23 0641  HGB  --  13.4 12.6  HCT  --  38.0 35.8*  PLT  --  264 252  CREATININE 0.54  --  0.56  TROPONINIHS 2  --   --     Estimated Creatinine Clearance: 106.5 mL/min (by C-G formula based on SCr of 0.56 mg/dL).   Medical History: Past Medical History:  Diagnosis Date   Anxiety    Brain cancer ([place])    Dyspnea    Endometriosis    Family history of ovarian cancer    Gastroesophageal reflux disease 09/20/2008   no problems since gallbladder taken out!! 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 Endorses recent wt gain of 64 lbs over past 3 months. Her cardiologist started her on Lasix, Zaroxolyn, and Kcl which caused dyspnea and presyncope and so she was switched to torsemide which she has not started. Due to persistent symptoms, pt now presenting to ED. Pharmacy has been consulted to initiate therapeutic enoxaparin until PE can be ruled out.  Baseline  Labs: INR - ordered; Hgb - 12.6; Plts - 252  Goal of Therapy:  Monitor platelets by anticoagulation protocol: Yes   Plan:  Patient received enoxaparin 0.5 mg/kg subQ x 1 this AM @ 1040 Give enoxaparin 0.5 mg/kg subQ x 1 @ 1600 to equal 1 mg/kg for daytime dose today Starting tomorrow 3/25 @ 0400, initiate enoxaparin 1 mg/kg subQ q12H q12H dosing interval preferred for BMI >30 CBC at least every 72 hrs while on enoxaparin  Will M. Ouida Sills, PharmD PGY-1 Pharmacy Resident 02/09/2023 2:52 PM

## 2023-02-09 NOTE — Assessment & Plan Note (Signed)
-   We will continue her Valium. 

## 2023-02-09 NOTE — Assessment & Plan Note (Signed)
-   She has a slightly elevated BNP however no pulmonary edema on chest x-ray. - We will hold off on diuretic therapy for now. - She stated that she had a 2D echo at [place] [facility] on Wednesday. - Cardiology consult to be obtained as mentioned above

## 2023-02-09 NOTE — Progress Notes (Signed)
[facility]                                                                               Denise Bryan, is a 37 y.o. female, DOB - 1985/12/31, [id number] Admit date - 02/08/2023    Outpatient Primary MD for the patient is [facility], [place]  LOS - 0  days    Brief summary   37 y.o. Caucasian female with medical history significant for medullary ependymoma, anxiety, endometriosis, and recent abnormal weight gain, who presented to the emergency room with acute onset of dyspnea on exertion and whole body swelling.  She gained about 64 pounds over the last 3 months.  She had outpatient work By her PCP.  She was initially given a prescription for Lasix 40 mg p.o. daily as it was later seen by her [facility] clinic cardiologist who added Zaroxolyn and potassium chloride.  After taking them she experienced dyspnea and presyncope and pallor and was advised to stop them.  She was then prescribed p.o. torsemide that she has not started. In view of her persistent symptoms, she was asked to come to ED for further evaluation.    Assessment & Plan    Assessment and Plan:  Dyspnea on exertion, elevated d dimer and abnormal weight gain:  - rule out PE.  V/Q scan ordered as she has anaphylactic reaction to iodine.  - will start her on therapeutic lovenox.  - restart her on torsemide.  - echocardiogram ordered.  - CXR does not show any pulm edema.  Cardiology consulted and on board.    Ependymoma ([place]) - This involves her medulla and spinal cord. - She is followed at [facility] for it.  -  No current neurological deficits and no worsening headaches.   Anxiety - We will continue her Valium.    Estimated body mass index is 35.78 kg/m as calculated from the following:   Height as of this encounter: 5\' 3"  (1.6 m).   Weight as of this encounter: 91.6 kg.  Code Status: full code.  DVT Prophylaxis:  lovenox.    Level of Care: Level of care:  Telemetry Medical Family Communication: none at bedside.   Disposition Plan:     Remains inpatient appropriate:  pending further evaluation.   Procedures:  V/Q scan.  Venous duplex of the lower extremities.  Echocardiogram.   Consultants:   Cardiology.   Antimicrobials:   Anti-infectives (From admission, onward)    None        Medications  Scheduled Meds:  dextromethorphan-guaiFENesin  1 tablet Oral BID   enoxaparin (LOVENOX) injection  0.5 mg/kg Subcutaneous Q12H   [START ON 02/10/2023] ketoconazole  1 Application Topical Once per day on Mon Thu   torsemide  20 mg Oral Daily   Continuous Infusions: PRN Meds:.acetaminophen **OR** acetaminophen, magnesium hydroxide, ondansetron **OR** ondansetron (ZOFRAN) IV, oxyCODONE, traZODone    Subjective:   Melayah Lusky was seen and examined today. Chest pressure on deep breathing. On RA.   Objective:   Vitals:   02/08/23 2001 02/08/23 2031 02/08/23 2346 02/09/23 0419  BP: 139/88 94/71 (!) 141/78 (!) 94/51  Pulse: 74 66 70 75  Resp: 18 16 20 20   Temp: 98.1 F (36.7 C) 98.1 F (36.7 C) 98.8 F (37.1 C) 97.9 F (36.6 C)  TempSrc: Oral Oral Oral Oral  SpO2: 99% 100% 99% 99%  Weight: 91.6 kg     Height: 5\' 3"  (1.6 m)       Intake/Output Summary (Last 24 hours) at 02/09/2023 1420 Last data filed at 02/09/2023 1418 Gross per 24 hour  Intake 180 ml  Output 0 ml  Net 180 ml   Filed Weights   02/08/23 2001  Weight: 91.6 kg     Exam General exam: Appears calm and comfortable  Respiratory system: Clear to auscultation. Respiratory effort normal. Cardiovascular system: S1 & S2 heard, RRR. No JVD,  Gastrointestinal system: Abdomen is nondistended, soft and nontender.  Central nervous system: Alert and oriented. No focal neurological deficits. Extremities: Symmetric 5 x 5 power. Skin: No rashes, lesions or ulcers Psychiatry:mood is appropriate.    Data Reviewed:  I have personally reviewed following labs and  imaging studies   CBC Lab Results  Component Value Date   WBC 5.1 02/09/2023   RBC 3.99 02/09/2023   HGB 12.6 02/09/2023   HCT 35.8 (L) 02/09/2023   MCV 89.7 02/09/2023   MCH 31.6 02/09/2023   PLT 252 02/09/2023   MCHC 35.2 02/09/2023   RDW 12.3 02/09/2023   LYMPHSABS 1.7 02/08/2023   MONOABS 0.5 02/08/2023   EOSABS 0.2 02/08/2023   BASOSABS 0.0 99991111     Last metabolic panel Lab Results  Component Value Date   NA 138 02/09/2023   K 3.6 02/09/2023   CL 110 02/09/2023   CO2 22 02/09/2023   BUN 11 02/09/2023   CREATININE 0.56 02/09/2023   GLUCOSE 101 (H) 02/09/2023   GFRNONAA >60 02/09/2023   GFRAA >60 04/02/2019   CALCIUM 8.4 (L) 02/09/2023   PROT 6.8 02/08/2023   ALBUMIN 3.8 02/08/2023   BILITOT 0.5 02/08/2023   ALKPHOS 85 02/08/2023   AST 27 02/08/2023   ALT 33 02/08/2023   ANIONGAP 6 02/09/2023    CBG (last 3)  No results for input(s): "GLUCAP" in the last 72 hours.    Coagulation Profile: No results for input(s): "INR", "PROTIME" in the last 168 hours.   Radiology Studies: ECHOCARDIOGRAM COMPLETE  Result Date: 02/09/2023    ECHOCARDIOGRAM REPORT   Patient Name:   LAURABETH DAYAN Kreps Date of Exam: 02/09/2023 Medical Rec #:  [id number]           Height:       63.0 in Accession #:    [id number]          Weight:       202.0 lb Date of Birth:  Sep 24, 1986           BSA:          1.942 m Patient Age:    36 years            BP:           94/51 mmHg Patient Gender: F                   HR:           83 bpm. Exam Location:  [facility] Procedure: 2D Echo Indications:     Dyspnea R06.00  History:         Patient has no prior history of Echocardiogram examinations.  Sonographer:     Kathlen Brunswick RDCS Referring Phys:  [place] Diagnosing  Phys: Isaias Cowman MD IMPRESSIONS  1. Left ventricular ejection fraction, by estimation, is 60 to 65%. The left ventricle has normal function. The left ventricle has no regional wall motion abnormalities. Left  ventricular diastolic parameters were normal.  2. Right ventricular systolic function is normal. The right ventricular size is normal.  3. The mitral valve is normal in structure. Trivial mitral valve regurgitation. No evidence of mitral stenosis.  4. The aortic valve is normal in structure. Aortic valve regurgitation is not visualized. No aortic stenosis is present.  5. The inferior vena cava is normal in size with greater than 50% respiratory variability, suggesting right atrial pressure of 3 mmHg. FINDINGS  Left Ventricle: Left ventricular ejection fraction, by estimation, is 60 to 65%. The left ventricle has normal function. The left ventricle has no regional wall motion abnormalities. The left ventricular internal cavity size was normal in size. There is  no left ventricular hypertrophy. Left ventricular diastolic parameters were normal. Right Ventricle: The right ventricular size is normal. No increase in right ventricular wall thickness. Right ventricular systolic function is normal. Left Atrium: Left atrial size was normal in size. Right Atrium: Right atrial size was normal in size. Pericardium: There is no evidence of pericardial effusion. Mitral Valve: The mitral valve is normal in structure. Trivial mitral valve regurgitation. No evidence of mitral valve stenosis. Tricuspid Valve: The tricuspid valve is normal in structure. Tricuspid valve regurgitation is trivial. No evidence of tricuspid stenosis. Aortic Valve: The aortic valve is normal in structure. Aortic valve regurgitation is not visualized. No aortic stenosis is present. Aortic valve peak gradient measures 6.0 mmHg. Pulmonic Valve: The pulmonic valve was normal in structure. Pulmonic valve regurgitation is not visualized. No evidence of pulmonic stenosis. Aorta: The aortic root is normal in size and structure. Venous: The inferior vena cava is normal in size with greater than 50% respiratory variability, suggesting right atrial pressure of 3 mmHg.  IAS/Shunts: No atrial level shunt detected by color flow Doppler.  LEFT VENTRICLE PLAX 2D LVIDd:         4.50 cm   Diastology LVIDs:         3.00 cm   LV e' medial:    14.90 cm/s LV PW:         0.90 cm   LV E/e' medial:  7.1 LV IVS:        1.00 cm   LV e' lateral:   16.50 cm/s LVOT diam:     1.80 cm   LV E/e' lateral: 6.4 LV SV:         48 LV SV Index:   25 LVOT Area:     2.54 cm  RIGHT VENTRICLE RV Basal diam:  2.70 cm RV S prime:     13.30 cm/s TAPSE (M-mode): 1.8 cm LEFT ATRIUM             Index        RIGHT ATRIUM           Index LA diam:        3.30 cm 1.70 cm/m   RA Area:     11.00 cm LA Vol (A2C):   33.4 ml 17.20 ml/m  RA Volume:   22.20 ml  11.43 ml/m LA Vol (A4C):   16.4 ml 8.45 ml/m LA Biplane Vol: 23.9 ml 12.31 ml/m  AORTIC VALVE                 PULMONIC VALVE AV Area (Vmax): 2.01 cm  PV Vmax:        0.91 m/s AV Vmax:        122.00 cm/s  PV Peak grad:   3.3 mmHg AV Peak Grad:   6.0 mmHg     RVOT Peak grad: 2 mmHg LVOT Vmax:      96.60 cm/s LVOT Vmean:     59.400 cm/s LVOT VTI:       0.187 m  AORTA Ao Root diam: 3.00 cm Ao Asc diam:  2.30 cm MITRAL VALVE                TRICUSPID VALVE MV Area (PHT): 3.93 cm     TV Peak grad:   22.5 mmHg MV Decel Time: 193 msec     TV Vmax:        2.37 m/s MV E velocity: 106.00 cm/s MV A velocity: 59.20 cm/s   SHUNTS MV E/A ratio:  1.79         Systemic VTI:  0.19 m                             Systemic Diam: 1.80 cm Isaias Cowman MD Electronically signed by Isaias Cowman MD Signature Date/Time: 02/09/2023/12:02:20 PM    Final    US Venous Img Lower Bilateral (DVT)  Result Date: 02/08/2023 CLINICAL DATA:  Lower extremity swelling EXAM: BILATERAL LOWER EXTREMITY VENOUS DOPPLER ULTRASOUND TECHNIQUE: Gray-scale sonography with graded compression, as well as color Doppler and duplex ultrasound were performed to evaluate the lower extremity deep venous systems from the level of the common femoral vein and including the common femoral, femoral, profunda  femoral, popliteal and calf veins including the posterior tibial, peroneal and gastrocnemius veins when visible. The superficial great saphenous vein was also interrogated. Spectral Doppler was utilized to evaluate flow at rest and with distal augmentation maneuvers in the common femoral, femoral and popliteal veins. COMPARISON:  04/01/2015 FINDINGS: RIGHT LOWER EXTREMITY Common Femoral Vein: No evidence of thrombus. Normal compressibility, respiratory phasicity and response to augmentation. Saphenofemoral Junction: No evidence of thrombus. Normal compressibility and flow on color Doppler imaging. Profunda Femoral Vein: No evidence of thrombus. Normal compressibility and flow on color Doppler imaging. Femoral Vein: No evidence of thrombus. Normal compressibility, respiratory phasicity and response to augmentation. Popliteal Vein: No evidence of thrombus. Normal compressibility, respiratory phasicity and response to augmentation. Calf Veins: Not well visualized Superficial Great Saphenous Vein: No evidence of thrombus. Normal compressibility. Venous Reflux:  None. Other Findings:  None. LEFT LOWER EXTREMITY Common Femoral Vein: No evidence of thrombus. Normal compressibility, respiratory phasicity and response to augmentation. Saphenofemoral Junction: No evidence of thrombus. Normal compressibility and flow on color Doppler imaging. Profunda Femoral Vein: No evidence of thrombus. Normal compressibility and flow on color Doppler imaging. Femoral Vein: No evidence of thrombus. Normal compressibility, respiratory phasicity and response to augmentation. Popliteal Vein: No evidence of thrombus. Normal compressibility, respiratory phasicity and response to augmentation. Calf Veins: Not well visualized Superficial Great Saphenous Vein: No evidence of thrombus. Normal compressibility. Venous Reflux:  None. Other Findings:  2.8 cm popliteal fossa collection is noted. IMPRESSION: No evidence of deep venous thrombosis in either  lower extremity. Calf veins were not well seen. Left popliteal cyst. Electronically Signed   By: Inez Catalina M.D.   On: 02/08/2023 22:28   DG Chest 2 View  Result Date: 02/08/2023 CLINICAL DATA:  Shortness of breath. Swelling in the legs, face, and hands. Difficulty breathing for a  month. EXAM: CHEST - 2 VIEW COMPARISON:  11/05/2016 FINDINGS: Heart size and pulmonary vascularity are normal. Lungs are clear. No pleural effusions. No pneumothorax. Mediastinal contours appear intact. Postoperative changes in the cervical spine. Surgical clips in the right upper quadrant. IMPRESSION: No active cardiopulmonary disease. Electronically Signed   By: Lucienne Capers M.D.   On: 02/08/2023 20:22       Hosie Poisson M.D. [facility] 02/09/2023, 2:20 PM  Available via Epic secure chat 7am-7pm After 7 pm, please refer to night coverage provider listed on amion.

## 2023-02-09 NOTE — Assessment & Plan Note (Signed)
-   She has slightly elevated D-dimer.  Will need to rule out acute pulmonary embolism. - The patient will be admitted to a medical telemetry observation bed. - Will obtain a VQ scan to rule out PE.  The patient has anaphylactic reaction to iodine. - She has no current O2 requirement.  Pulse oximetry will be followed while she is here. - I will hold off IV heparin given her negative bilateral lower extremity venous Doppler.

## 2023-02-09 NOTE — Consult Note (Signed)
[facility] Cardiology  CARDIOLOGY CONSULT NOTE  Patient ID: Denise Bryan MRN: [id number] DOB/AGE: 1986-11-10 37 y.o.  Admit date: 02/08/2023 Referring Physician [place] Primary Physician [facility] Primary Cardiologist Roberta Angell Reason for Consultation dyspnea  HPI: 37 year old female referred for evaluation of dyspnea.  Patient reports 50-60 pound weight gain over the last 3 months with associated peripheral edema.  She was treated with furosemide 40 mg daily without clinical improvement.  She was started on Zaroxolyn started to experience dyspnea.  Furosemide was changed to torsemide as unavailable at her pharmacy.  The patient had persistent shortness of breath and presented to [facility] ED.  ECG revealed normal sinus rhythm at 72 bpm.  Chest x-ray did not reveal any active cardiopulmonary disease.  On room was 2.  BNP 212.  Extremity ultrasound was negative for DVT.  Oxygen saturation was 99% on room air.  Patient scheduled for outpatient 2D echocardiogram next week.  Patient has a history of ependymoma of the medulla and spinal cord followed at [facility].  Review of systems complete and found to be negative unless listed above     Past Medical History:  Diagnosis Date   Anxiety    Brain cancer ([place])    Dyspnea    Endometriosis    Family history of ovarian cancer    Gastroesophageal reflux disease 09/20/2008   no problems since gallbladder taken out!! 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[place])    Neuromuscular disorder ([place])    Spontaneous abortion    Unspecified blood type, rh negative     Past Surgical History:  Procedure Laterality Date   BRAIN TUMOR EXCISION     CHOLECYSTECTOMY  2007   CYSTOSCOPY N/A 09/01/2018   Procedure: CYSTOSCOPY;  Surgeon: Will Bonnet, MD;  Location: [facility] ORS;  Service:  Gynecology;  Laterality: N/A;   DIAGNOSTIC LAPAROSCOPY  04/26/2010   IUD EXPULSION/PELVIC PAIN   DILATION AND CURETTAGE OF UTERUS  08/10/10; 08/06/07   HYDATIIDIFORM MOLE/POC, POC   DIRECT LARYNGOSCOPY N/A 10/10/2020   Procedure: INJECTION LARYNGOPLASTY C VOICE GEL, LEFT ONE VOCAL FOLD;  Surgeon: Carloyn Manner, MD;  Location: [facility] ORS;  Service: ENT;  Laterality: N/A;   gallstones  08/05/2006   HYSTEROSCOPY  04/26/2010   IUD EXPULSION [place] N/A 09/01/2018   Procedure: HYSTERECTOMY TOTAL LAPAROSCOPIC BILATERAL SALPINGECTOMY;  Surgeon: Will Bonnet, MD;  Location: [facility] ORS;  Service: Gynecology;  Laterality: N/A;   LAPAROSCOPY N/A 08/21/2017   Procedure: LAPAROSCOPY DIAGNOSTIC;  Surgeon: Will Bonnet, MD;  Location: [facility] ORS;  Service: Gynecology;  Laterality: N/A;   LAPAROSCOPY N/A 07/30/2018   Procedure: LAPAROSCOPY DIAGNOSTIC WITH BILATERAL SALPINGECTOMY;  Surgeon: Will Bonnet, MD;  Location: [facility] ORS;  Service: Gynecology;  Laterality: N/A;   SPINAL FUSION  2021   TUBAL LIGATION  01/2012   SDJ    Medications Prior to Admission  Medication Sig Dispense Refill Last Dose   furosemide (LASIX) 40 MG tablet Take 40 mg by mouth daily.   Past Week   ketoconazole (NIZORAL) 2 % shampoo Apply 1 Application topically 2 (two) times a week.   Past Month   oxyCODONE (OXY IR/ROXICODONE) 5 MG immediate release tablet Take 5 mg by mouth every 8 (eight) hours as needed.   02/08/2023  torsemide (DEMADEX) 20 MG tablet Take 20 mg by mouth daily.   Past Week   diazepam (VALIUM) 5 MG tablet Take one tablet (5mg ) 30-60 minutes prior to MRI. If still anxious immediately prior to MRI, then you can take a second tablet (5mg ). Do not drive or operate heavy machinery for a minimum of 5-7 hours after taking diazepam (valium). (Patient not taking: Reported on 02/08/2023)   Not Taking at prn   metolazone (ZAROXOLYN) 5 MG tablet Take 5 mg by mouth daily. (Patient not  taking: Reported on 02/08/2023)   Not Taking   potassium chloride SA (KLOR-CON M) 20 MEQ tablet Take 1 tablet by mouth 2 (two) times daily. (Patient not taking: Reported on 02/08/2023)   Not Taking   Social History   Socioeconomic History   Marital status: Single    Spouse name: Not on file   Number of children: 3   Years of education: 12   Highest education level: Not on file  Occupational History   Occupation: hostess    Comment: works at [employer] Use   Smoking status: Never   Smokeless tobacco: Never  Scientific laboratory technician Use: Never used  Substance and Sexual Activity   Alcohol use: No   Drug use: No   Sexual activity: Not Currently    Birth control/protection: Surgical    Comment: Hysterectomy  Other Topics Concern   Not on file  Social History Narrative   Not on file   Social Determinants of Health   Financial Resource Strain: Not on file  Food Insecurity: No Food Insecurity (02/09/2023)   Hunger Vital Sign    Worried About Running Out of Food in the Last Year: Never true    Ran Out of Food in the Last Year: Never true  Transportation Needs: No Transportation Needs (02/09/2023)   PRAPARE - Hydrologist (Medical): No    Lack of Transportation (Non-Medical): No  Physical Activity: Not on file  Stress: Not on file  Social Connections: Not on file  Intimate Partner Violence: Not At Risk (02/09/2023)   Humiliation, Afraid, Rape, and Kick questionnaire    Fear of Current or Ex-Partner: No    Emotionally Abused: No    Physically Abused: No    Sexually Abused: No    Family History  Problem Relation Age of Onset   Diabetes Mother    Hypertension Mother    Cancer Mother 40       UTERINE   Colon cancer Mother    Cancer Sister 20       CX   Diabetes Maternal Uncle    Diabetes Maternal Grandmother    Hypertension Maternal Grandmother    Lung disease Maternal Grandfather    Cancer Maternal Grandfather        LUNG   Heart disease  Paternal Grandmother    Hypertension Paternal Grandmother       Review of systems complete and found to be negative unless listed above      PHYSICAL EXAM  General: Well developed, well nourished, in no acute distress HEENT:  Normocephalic and atramatic Neck:  No JVD.  Lungs: Clear bilaterally to auscultation and percussion. Heart: HRRR . Normal S1 and S2 without gallops or murmurs.  Abdomen: Bowel sounds are positive, abdomen soft and non-tender  Msk:  Back normal, normal gait. Normal strength and tone for age. Extremities: No clubbing, cyanosis or edema.   Neuro: Alert and oriented X 3. Psych:  Good affect, responds appropriately  Labs:   Lab Results  Component Value Date   WBC 5.1 02/09/2023   HGB 12.6 02/09/2023   HCT 35.8 (L) 02/09/2023   MCV 89.7 02/09/2023   PLT 252 02/09/2023    Recent Labs  Lab 02/08/23 2008 02/09/23 0641  NA 139 138  K 3.8 3.6  CL 110 110  CO2 24 22  BUN 12 11  CREATININE 0.54 0.56  CALCIUM 8.9 8.4*  PROT 6.8  --   BILITOT 0.5  --   ALKPHOS 85  --   ALT 33  --   AST 27  --   GLUCOSE 94 101*   No results found for: "CKTOTAL", "CKMB", "CKMBINDEX", "TROPONINI" No results found for: "CHOL" No results found for: "HDL" No results found for: "LDLCALC" No results found for: "TRIG" No results found for: "CHOLHDL" No results found for: "LDLDIRECT"    Radiology: US Venous Img Lower Bilateral (DVT)  Result Date: 02/08/2023 CLINICAL DATA:  Lower extremity swelling EXAM: BILATERAL LOWER EXTREMITY VENOUS DOPPLER ULTRASOUND TECHNIQUE: Gray-scale sonography with graded compression, as well as color Doppler and duplex ultrasound were performed to evaluate the lower extremity deep venous systems from the level of the common femoral vein and including the common femoral, femoral, profunda femoral, popliteal and calf veins including the posterior tibial, peroneal and gastrocnemius veins when visible. The superficial great saphenous vein was also  interrogated. Spectral Doppler was utilized to evaluate flow at rest and with distal augmentation maneuvers in the common femoral, femoral and popliteal veins. COMPARISON:  04/01/2015 FINDINGS: RIGHT LOWER EXTREMITY Common Femoral Vein: No evidence of thrombus. Normal compressibility, respiratory phasicity and response to augmentation. Saphenofemoral Junction: No evidence of thrombus. Normal compressibility and flow on color Doppler imaging. Profunda Femoral Vein: No evidence of thrombus. Normal compressibility and flow on color Doppler imaging. Femoral Vein: No evidence of thrombus. Normal compressibility, respiratory phasicity and response to augmentation. Popliteal Vein: No evidence of thrombus. Normal compressibility, respiratory phasicity and response to augmentation. Calf Veins: Not well visualized Superficial Great Saphenous Vein: No evidence of thrombus. Normal compressibility. Venous Reflux:  None. Other Findings:  None. LEFT LOWER EXTREMITY Common Femoral Vein: No evidence of thrombus. Normal compressibility, respiratory phasicity and response to augmentation. Saphenofemoral Junction: No evidence of thrombus. Normal compressibility and flow on color Doppler imaging. Profunda Femoral Vein: No evidence of thrombus. Normal compressibility and flow on color Doppler imaging. Femoral Vein: No evidence of thrombus. Normal compressibility, respiratory phasicity and response to augmentation. Popliteal Vein: No evidence of thrombus. Normal compressibility, respiratory phasicity and response to augmentation. Calf Veins: Not well visualized Superficial Great Saphenous Vein: No evidence of thrombus. Normal compressibility. Venous Reflux:  None. Other Findings:  2.8 cm popliteal fossa collection is noted. IMPRESSION: No evidence of deep venous thrombosis in either lower extremity. Calf veins were not well seen. Left popliteal cyst. Electronically Signed   By: Inez Catalina M.D.   On: 02/08/2023 22:28   DG Chest 2  View  Result Date: 02/08/2023 CLINICAL DATA:  Shortness of breath. Swelling in the legs, face, and hands. Difficulty breathing for a month. EXAM: CHEST - 2 VIEW COMPARISON:  11/05/2016 FINDINGS: Heart size and pulmonary vascularity are normal. Lungs are clear. No pleural effusions. No pneumothorax. Mediastinal contours appear intact. Postoperative changes in the cervical spine. Surgical clips in the right upper quadrant. IMPRESSION: No active cardiopulmonary disease. Electronically Signed   By: Lucienne Capers M.D.   On: 02/08/2023 20:22    EKG: Normal sinus  rhythm 82 bpm  ASSESSMENT AND PLAN:   1.  Dyspnea, with associated 50-60 pound weight gain over 3 months, with 1+ peripheral edema, with normal ECG, negative chest x-ray, negative lower extremity ultrasound, normal troponin, mildly elevated BNP 2.  Ependymoma involving medulla and spinal cord  Recommendations  1.  Agree with current therapy 2.  Await results of VQ scan 3.  Review 2D echocardiogram 4.  Further recommendations pending 2D echocardiogram results  Signed: Isaias Cowman MD,PhD, [facility] 02/09/2023, 8:50 AM

## 2023-02-09 NOTE — Progress Notes (Signed)
  Echocardiogram 2D Echocardiogram has been performed.  Claretta Fraise 02/09/2023, 11:30 AM

## 2023-02-10 ENCOUNTER — Observation Stay: Payer: Medicaid Other

## 2023-02-10 DIAGNOSIS — F419 Anxiety disorder, unspecified: Secondary | ICD-10-CM | POA: Diagnosis not present

## 2023-02-10 DIAGNOSIS — R635 Abnormal weight gain: Secondary | ICD-10-CM | POA: Diagnosis not present

## 2023-02-10 DIAGNOSIS — C719 Malignant neoplasm of brain, unspecified: Secondary | ICD-10-CM | POA: Diagnosis not present

## 2023-02-10 DIAGNOSIS — R0600 Dyspnea, unspecified: Secondary | ICD-10-CM | POA: Diagnosis not present

## 2023-02-10 LAB — BASIC METABOLIC PANEL
Anion gap: 12 (ref 5–15)
BUN: 10 mg/dL (ref 6–20)
CO2: 26 mmol/L (ref 22–32)
Calcium: 9.6 mg/dL (ref 8.9–10.3)
Chloride: 100 mmol/L (ref 98–111)
Creatinine, Ser: 0.65 mg/dL (ref 0.44–1.00)
GFR, Estimated: >60 mL/min (ref >60)
Glucose, Bld: 124 mg/dL — ABNORMAL HIGH (ref 70–99)
Potassium: 4 mmol/L (ref 3.5–5.1)
Sodium: 138 mmol/L (ref 135–145)

## 2023-02-10 LAB — URINALYSIS, COMPLETE (UACMP) WITH MICROSCOPIC
Bilirubin Urine: NEGATIVE
Glucose, UA: NEGATIVE mg/dL
Hgb urine dipstick: NEGATIVE
Ketones, ur: NEGATIVE mg/dL
Nitrite: NEGATIVE
Protein, ur: NEGATIVE mg/dL
Specific Gravity, Urine: 1.014 (ref 1.005–1.030)
pH: 5 (ref 5.0–8.0)

## 2023-02-10 MED ORDER — TORSEMIDE 20 MG PO TABS
20.0000 mg | ORAL_TABLET | Freq: Two times a day (BID) | ORAL | Status: DC
  Administered 2023-02-10 – 2023-02-11 (×2): 20 mg via ORAL
  Filled 2023-02-10 (×2): qty 1

## 2023-02-10 MED ORDER — ENOXAPARIN SODIUM 60 MG/0.6ML IJ SOSY: 45.0000 mg | PREFILLED_SYRINGE | INTRAMUSCULAR | Status: DC

## 2023-02-10 MED ORDER — TECHNETIUM TO 99M ALBUMIN AGGREGATED
4.3700 | Freq: Once | INTRAVENOUS | Status: AC | PRN
  Administered 2023-02-10: 4.37 via INTRAVENOUS

## 2023-02-10 NOTE — Progress Notes (Signed)
[facility]                                                                               Denise Bryan, is a 37 y.o. female, DOB - 1986-01-29, [id number] Admit date - 02/08/2023    Outpatient Primary MD for the patient is [facility] [place]  LOS - 0  days    Brief summary   37 y.o. Caucasian female with medical history significant for medullary ependymoma, anxiety, endometriosis, and recent abnormal weight gain, who presented to the emergency room with acute onset of dyspnea on exertion and whole body swelling.  She gained about 64 pounds over the last 3 months.  She had outpatient work By her PCP.  She was initially given a prescription for Lasix 40 mg p.o. daily as it was later seen by her [facility] clinic cardiologist who added Zaroxolyn and potassium chloride.  After taking them she experienced dyspnea and presyncope and pallor and was advised to stop them.  She was then prescribed p.o. torsemide that she has not started. In view of her persistent symptoms, she was asked to come to ED for further evaluation.    Assessment & Plan    Assessment and Plan:  Dyspnea on exertion, elevated d dimer and abnormal weight gain:  Rule out PE with neg V/Q SCAN.  Echocardiogram wnl.  Troponins negative.  Albumin wnl. TSH wnl.  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 - She is followed at [facility] for it.  -  No current neurological deficits and no worsening headaches.   Anxiety - We will continue her Valium. Add Atarax for anxiety.     Estimated body mass index is 35.78 kg/m as calculated from the following:   Height as of this encounter: 5\' 3"  (1.6 m).   Weight as of this encounter: 91.6 kg.  Code Status: full code.  DVT Prophylaxis:  lovenox.    Level of Care: Level of care: Telemetry Medical Family  Communication: none at bedside.   Disposition Plan:     Remains inpatient appropriate:  pending further evaluation for dyspnea  Procedures:  V/Q scan.  Venous duplex of the lower extremities.  Echocardiogram.   Consultants:   Cardiology.   Antimicrobials:   Anti-infectives (From admission, onward)    None        Medications  Scheduled Meds:  dextromethorphan-guaiFENesin  1 tablet Oral BID   [START ON 02/11/2023] enoxaparin (LOVENOX) injection  45 mg Subcutaneous Q24H   ketoconazole  1 Application Topical Once per day on Mon Thu   torsemide  20 mg Oral BID   Continuous Infusions: PRN Meds:.acetaminophen **OR** acetaminophen, ibuprofen, magnesium hydroxide, ondansetron **OR** ondansetron (ZOFRAN) IV, oxyCODONE, traZODone    Subjective:   Denise Bryan was seen and examined today. On RA.   Objective:   Vitals:   02/09/23 0419 02/09/23 1955 02/10/23 0459 02/10/23 0736  BP: (!) 94/51 (!) 101/59 (!) 91/55 (!) 97/49  Pulse: 75 81 80 76  Resp: 20 18 18 18   Temp: 97.9 F (  36.6 C) 97.9 F (36.6 C) 98.3 F (36.8 C) 98.1 F (36.7 C)  TempSrc: Oral Oral  Oral  SpO2: 99% 99% 100% 97%  Weight:      Height:        Intake/Output Summary (Last 24 hours) at 02/10/2023 1448 Last data filed at 02/09/2023 1921 Gross per 24 hour  Intake 120 ml  Output --  Net 120 ml    Filed Weights   02/08/23 2001  Weight: 91.6 kg     Exam General exam: Appears calm and comfortable  Respiratory system: Clear to auscultation. Respiratory effort normal. Cardiovascular system: S1 & S2 heard, RRR. No JVD, Gastrointestinal system: Abdomen is nondistended, soft and nontender. Central nervous system: Alert and oriented. No focal neurological deficits. Extremities: Symmetric 5 x 5 power. Skin: No rashes Psychiatry: Mood & affect appropriate.   Data Reviewed:  I have personally reviewed following labs and imaging studies   CBC Lab Results  Component Value Date   WBC 5.1  02/09/2023   RBC 3.99 02/09/2023   HGB 12.6 02/09/2023   HCT 35.8 (L) 02/09/2023   MCV 89.7 02/09/2023   MCH 31.6 02/09/2023   PLT 252 02/09/2023   MCHC 35.2 02/09/2023   RDW 12.3 02/09/2023   LYMPHSABS 1.7 02/08/2023   MONOABS 0.5 02/08/2023   EOSABS 0.2 02/08/2023   BASOSABS 0.0 [id number]     Last metabolic panel Lab Results  Component Value Date   NA 138 02/10/2023   K 4.0 02/10/2023   CL 100 02/10/2023   CO2 26 02/10/2023   BUN 10 02/10/2023   CREATININE 0.65 02/10/2023   GLUCOSE 124 (H) 02/10/2023   GFRNONAA >60 02/10/2023   GFRAA >60 04/02/2019   CALCIUM 9.6 02/10/2023   PROT 6.8 02/08/2023   ALBUMIN 3.8 02/08/2023   BILITOT 0.5 02/08/2023   ALKPHOS 85 02/08/2023   AST 27 02/08/2023   ALT 33 02/08/2023   ANIONGAP 12 02/10/2023    CBG (last 3)  No results for input(s): "GLUCAP" in the last 72 hours.    Coagulation Profile: Recent Labs  Lab 02/09/23 1537  INR 1.1     Radiology Studies: NM Pulmonary Perfusion  Result Date: 02/10/2023 CLINICAL DATA:  High clinical suspicion for pulmonary embolism EXAM: NUCLEAR MEDICINE PERFUSION LUNG SCAN TECHNIQUE: Perfusion images were obtained in multiple projections after intravenous injection of radiopharmaceutical. Ventilation scans intentionally deferred if perfusion scan and chest x-ray adequate for interpretation during COVID 19 epidemic. RADIOPHARMACEUTICALS:  4.37 mCi Tc-45m MAA IV COMPARISON:  None Available. FINDINGS: There are no wedge-shaped perfusion defects. IMPRESSION: No focal abnormalities are seen in perfusion lung scan. Electronically Signed   By: Elmer Picker M.D.   On: 02/10/2023 10:11   DG Chest Port 1 View  Result Date: 02/10/2023 CLINICAL DATA:  Dyspnea. EXAM: PORTABLE CHEST 1 VIEW COMPARISON:  February 08, 2023. FINDINGS: The heart size and mediastinal contours are within normal limits. Both lungs are clear. The visualized skeletal structures are unremarkable. IMPRESSION: No active disease.  Electronically Signed   By: Marijo Conception M.D.   On: 02/10/2023 09:38   ECHOCARDIOGRAM COMPLETE  Result Date: 02/09/2023    ECHOCARDIOGRAM REPORT   Patient Name:   Denise Bryan Aceituno Date of Exam: 02/09/2023 Medical Rec #:  [id number]           Height:       63.0 in Accession #:    [id number]          Weight:  202.0 lb Date of Birth:  24-Dec-1985           BSA:          1.942 m Patient Age:    36 years            BP:           94/51 mmHg Patient Gender: F                   HR:           83 bpm. Exam Location:  [facility] Procedure: 2D Echo Indications:     Dyspnea R06.00  History:         Patient has no prior history of Echocardiogram examinations.  Sonographer:     Kathlen Brunswick RDCS Referring Phys:  [place] Diagnosing Phys: Isaias Cowman MD IMPRESSIONS  1. Left ventricular ejection fraction, by estimation, is 60 to 65%. The left ventricle has normal function. The left ventricle has no regional wall motion abnormalities. Left ventricular diastolic parameters were normal.  2. Right ventricular systolic function is normal. The right ventricular size is normal.  3. The mitral valve is normal in structure. Trivial mitral valve regurgitation. No evidence of mitral stenosis.  4. The aortic valve is normal in structure. Aortic valve regurgitation is not visualized. No aortic stenosis is present.  5. The inferior vena cava is normal in size with greater than 50% respiratory variability, suggesting right atrial pressure of 3 mmHg. FINDINGS  Left Ventricle: Left ventricular ejection fraction, by estimation, is 60 to 65%. The left ventricle has normal function. The left ventricle has no regional wall motion abnormalities. The left ventricular internal cavity size was normal in size. There is  no left ventricular hypertrophy. Left ventricular diastolic parameters were normal. Right Ventricle: The right ventricular size is normal. No increase in right ventricular wall thickness. Right ventricular  systolic function is normal. Left Atrium: Left atrial size was normal in size. Right Atrium: Right atrial size was normal in size. Pericardium: There is no evidence of pericardial effusion. Mitral Valve: The mitral valve is normal in structure. Trivial mitral valve regurgitation. No evidence of mitral valve stenosis. Tricuspid Valve: The tricuspid valve is normal in structure. Tricuspid valve regurgitation is trivial. No evidence of tricuspid stenosis. Aortic Valve: The aortic valve is normal in structure. Aortic valve regurgitation is not visualized. No aortic stenosis is present. Aortic valve peak gradient measures 6.0 mmHg. Pulmonic Valve: The pulmonic valve was normal in structure. Pulmonic valve regurgitation is not visualized. No evidence of pulmonic stenosis. Aorta: The aortic root is normal in size and structure. Venous: The inferior vena cava is normal in size with greater than 50% respiratory variability, suggesting right atrial pressure of 3 mmHg. IAS/Shunts: No atrial level shunt detected by color flow Doppler.  LEFT VENTRICLE PLAX 2D LVIDd:         4.50 cm   Diastology LVIDs:         3.00 cm   LV e' medial:    14.90 cm/s LV PW:         0.90 cm   LV E/e' medial:  7.1 LV IVS:        1.00 cm   LV e' lateral:   16.50 cm/s LVOT diam:     1.80 cm   LV E/e' lateral: 6.4 LV SV:         48 LV SV Index:   25 LVOT Area:     2.54 cm  RIGHT VENTRICLE RV Basal diam:  2.70 cm RV S prime:     13.30 cm/s TAPSE (M-mode): 1.8 cm LEFT ATRIUM             Index        RIGHT ATRIUM           Index LA diam:        3.30 cm 1.70 cm/m   RA Area:     11.00 cm LA Vol (A2C):   33.4 ml 17.20 ml/m  RA Volume:   22.20 ml  11.43 ml/m LA Vol (A4C):   16.4 ml 8.45 ml/m LA Biplane Vol: 23.9 ml 12.31 ml/m  AORTIC VALVE                 PULMONIC VALVE AV Area (Vmax): 2.01 cm     PV Vmax:        0.91 m/s AV Vmax:        122.00 cm/s  PV Peak grad:   3.3 mmHg AV Peak Grad:   6.0 mmHg     RVOT Peak grad: 2 mmHg LVOT Vmax:      96.60 cm/s  LVOT Vmean:     59.400 cm/s LVOT VTI:       0.187 m  AORTA Ao Root diam: 3.00 cm Ao Asc diam:  2.30 cm MITRAL VALVE                TRICUSPID VALVE MV Area (PHT): 3.93 cm     TV Peak grad:   22.5 mmHg MV Decel Time: 193 msec     TV Vmax:        2.37 m/s MV E velocity: 106.00 cm/s MV A velocity: 59.20 cm/s   SHUNTS MV E/A ratio:  1.79         Systemic VTI:  0.19 m                             Systemic Diam: 1.80 cm Isaias Cowman MD Electronically signed by Isaias Cowman MD Signature Date/Time: 02/09/2023/12:02:20 PM    Final    US Venous Img Lower Bilateral (DVT)  Result Date: 02/08/2023 CLINICAL DATA:  Lower extremity swelling EXAM: BILATERAL LOWER EXTREMITY VENOUS DOPPLER ULTRASOUND TECHNIQUE: Gray-scale sonography with graded compression, as well as color Doppler and duplex ultrasound were performed to evaluate the lower extremity deep venous systems from the level of the common femoral vein and including the common femoral, femoral, profunda femoral, popliteal and calf veins including the posterior tibial, peroneal and gastrocnemius veins when visible. The superficial great saphenous vein was also interrogated. Spectral Doppler was utilized to evaluate flow at rest and with distal augmentation maneuvers in the common femoral, femoral and popliteal veins. COMPARISON:  04/01/2015 FINDINGS: RIGHT LOWER EXTREMITY Common Femoral Vein: No evidence of thrombus. Normal compressibility, respiratory phasicity and response to augmentation. Saphenofemoral Junction: No evidence of thrombus. Normal compressibility and flow on color Doppler imaging. Profunda Femoral Vein: No evidence of thrombus. Normal compressibility and flow on color Doppler imaging. Femoral Vein: No evidence of thrombus. Normal compressibility, respiratory phasicity and response to augmentation. Popliteal Vein: No evidence of thrombus. Normal compressibility, respiratory phasicity and response to augmentation. Calf Veins: Not well visualized  Superficial Great Saphenous Vein: No evidence of thrombus. Normal compressibility. Venous Reflux:  None. Other Findings:  None. LEFT LOWER EXTREMITY Common Femoral Vein: No evidence of thrombus. Normal compressibility, respiratory phasicity and response to augmentation. Saphenofemoral Junction: No  evidence of thrombus. Normal compressibility and flow on color Doppler imaging. Profunda Femoral Vein: No evidence of thrombus. Normal compressibility and flow on color Doppler imaging. Femoral Vein: No evidence of thrombus. Normal compressibility, respiratory phasicity and response to augmentation. Popliteal Vein: No evidence of thrombus. Normal compressibility, respiratory phasicity and response to augmentation. Calf Veins: Not well visualized Superficial Great Saphenous Vein: No evidence of thrombus. Normal compressibility. Venous Reflux:  None. Other Findings:  2.8 cm popliteal fossa collection is noted. IMPRESSION: No evidence of deep venous thrombosis in either lower extremity. Calf veins were not well seen. Left popliteal cyst. Electronically Signed   By: Inez Catalina M.D.   On: 02/08/2023 22:28   DG Chest 2 View  Result Date: 02/08/2023 CLINICAL DATA:  Shortness of breath. Swelling in the legs, face, and hands. Difficulty breathing for a month. EXAM: CHEST - 2 VIEW COMPARISON:  11/05/2016 FINDINGS: Heart size and pulmonary vascularity are normal. Lungs are clear. No pleural effusions. No pneumothorax. Mediastinal contours appear intact. Postoperative changes in the cervical spine. Surgical clips in the right upper quadrant. IMPRESSION: No active cardiopulmonary disease. Electronically Signed   By: Lucienne Capers M.D.   On: 02/08/2023 20:22       Hosie Poisson M.D. [facility] 02/10/2023, 2:48 PM  Available via Epic secure chat 7am-7pm After 7 pm, please refer to night coverage provider listed on amion.

## 2023-02-10 NOTE — Progress Notes (Signed)
[facility] Cardiology  CARDIOLOGY CONSULT NOTE  Patient ID: Denise Bryan MRN: [id number] DOB/AGE: 1986-10-07 37 y.o.  Admit date: 02/08/2023 Referring Physician [place] Primary Physician Princella [facility] Primary Cardiologist [place] Reason for Consultation dyspnea  HPI: 37 year old female referred for evaluation of dyspnea.  Patient reports 50-60 pound weight gain over the last 3 months with associated peripheral edema.  She was treated with furosemide 40 mg daily without clinical improvement.  She was started on Zaroxolyn started to experience dyspnea.  Furosemide was changed to torsemide as unavailable at her pharmacy.  The patient had persistent shortness of breath and presented to [facility] ED.  ECG revealed normal sinus rhythm at 72 bpm.  Chest x-ray did not reveal any active cardiopulmonary disease.  Troponin was 2.  BNP 212.  Extremity ultrasound was negative for DVT.  Oxygen saturation was 99% on room air.  Patient scheduled for outpatient 2D echocardiogram next week.  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  Interval History:  - echo resulted with preserved EF, normal systolic and diastolic function - the patient doesn't feel much relief in her dyspnea with the change to torsemide. Remains on room air without hypoxia. Feels short of breath at rest and sitting upright, walking, or laying down.  - peripheral edema is a little better today, but patient states she still feels swollen in her upper thighs and has to change positions often d/t the discomfort (cannot get comfortable sitting, standing, or laying down for too long)   Review of systems complete and found to be negative unless listed above     Past Medical History:  Diagnosis Date   Anxiety    Brain cancer ([place])    Dyspnea    Endometriosis    Family history of ovarian cancer    Gastroesophageal reflux disease 09/20/2008   no problems since gallbladder taken out!! 2007    Genetic testing of female 09/2016   TC=8.8% PER [facility]   Genital herpes simplex 01/02/2015   Headache    migraines, require a visit to the emergency room   Malignant tumor of spinal cord ([place])    Neuromuscular disorder ([place])    Spontaneous abortion    Unspecified blood type, rh negative     Past Surgical History:  Procedure Laterality Date   BRAIN TUMOR EXCISION     CHOLECYSTECTOMY  2007   CYSTOSCOPY N/A 09/01/2018   Procedure: CYSTOSCOPY;  Surgeon: Will Bonnet, MD;  Location: [facility] ORS;  Service: Gynecology;  Laterality: N/A;   DIAGNOSTIC LAPAROSCOPY  04/26/2010   IUD EXPULSION/PELVIC PAIN   DILATION AND CURETTAGE OF UTERUS  08/10/10; 08/06/07   HYDATIIDIFORM MOLE/POC, POC   DIRECT LARYNGOSCOPY N/A 10/10/2020   Procedure: INJECTION LARYNGOPLASTY C VOICE GEL, LEFT ONE VOCAL FOLD;  Surgeon: Carloyn Manner, MD;  Location: [facility] ORS;  Service: ENT;  Laterality: N/A;   gallstones  08/05/2006   HYSTEROSCOPY  04/26/2010   IUD EXPULSION [place] N/A 09/01/2018   Procedure: HYSTERECTOMY TOTAL LAPAROSCOPIC BILATERAL SALPINGECTOMY;  Surgeon: Will Bonnet, MD;  Location: [facility] ORS;  Service: Gynecology;  Laterality: N/A;   LAPAROSCOPY N/A 08/21/2017   Procedure: LAPAROSCOPY DIAGNOSTIC;  Surgeon: Will Bonnet, MD;  Location: [facility] ORS;  Service: Gynecology;  Laterality: N/A;   LAPAROSCOPY N/A 07/30/2018   Procedure: LAPAROSCOPY DIAGNOSTIC WITH BILATERAL SALPINGECTOMY;  Surgeon: Will Bonnet, MD;  Location: [facility] ORS;  Service: Gynecology;  Laterality: N/A;   SPINAL  FUSION  2021   TUBAL LIGATION  01/2012   SDJ    Medications Prior to Admission  Medication Sig Dispense Refill Last Dose   furosemide (LASIX) 40 MG tablet Take 40 mg by mouth daily.   Past Week   ketoconazole (NIZORAL) 2 % shampoo Apply 1 Application topically 2 (two) times a week.   Past Month   oxyCODONE (OXY IR/ROXICODONE) 5 MG immediate release tablet Take 5 mg by mouth  every 8 (eight) hours as needed.   02/08/2023   torsemide (DEMADEX) 20 MG tablet Take 20 mg by mouth daily.   Past Week   diazepam (VALIUM) 5 MG tablet Take one tablet (5mg ) 30-60 minutes prior to MRI. If still anxious immediately prior to MRI, then you can take a second tablet (5mg ). Do not drive or operate heavy machinery for a minimum of 5-7 hours after taking diazepam (valium). (Patient not taking: Reported on 02/08/2023)   Not Taking at prn   metolazone (ZAROXOLYN) 5 MG tablet Take 5 mg by mouth daily. (Patient not taking: Reported on 02/08/2023)   Not Taking   potassium chloride SA (KLOR-CON M) 20 MEQ tablet Take 1 tablet by mouth 2 (two) times daily. (Patient not taking: Reported on 02/08/2023)   Not Taking   Social History   Socioeconomic History   Marital status: Single    Spouse name: Not on file   Number of children: 3   Years of education: 12   Highest education level: Not on file  Occupational History   Occupation: hostess    Comment: works at [employer] Use   Smoking status: Never   Smokeless tobacco: Never  Scientific laboratory technician Use: Never used  Substance and Sexual Activity   Alcohol use: No   Drug use: No   Sexual activity: Not Currently    Birth control/protection: Surgical    Comment: Hysterectomy  Other Topics Concern   Not on file  Social History Narrative   Not on file   Social Determinants of Health   Financial Resource Strain: Not on file  Food Insecurity: No Food Insecurity (02/09/2023)   Hunger Vital Sign    Worried About Running Out of Food in the Last Year: Never true    Ran Out of Food in the Last Year: Never true  Transportation Needs: No Transportation Needs (02/09/2023)   PRAPARE - Hydrologist (Medical): No    Lack of Transportation (Non-Medical): No  Physical Activity: Not on file  Stress: Not on file  Social Connections: Not on file  Intimate Partner Violence: Not At Risk (02/09/2023)   Humiliation, Afraid,  Rape, and Kick questionnaire    Fear of Current or Ex-Partner: No    Emotionally Abused: No    Physically Abused: No    Sexually Abused: No    Family History  Problem Relation Age of Onset   Diabetes Mother    Hypertension Mother    Cancer Mother 52       UTERINE   Colon cancer Mother    Cancer Sister 20       CX   Diabetes Maternal Uncle    Diabetes Maternal Grandmother    Hypertension Maternal Grandmother    Lung disease Maternal Grandfather    Cancer Maternal Grandfather        LUNG   Heart disease Paternal Grandmother    Hypertension Paternal Grandmother      PHYSICAL EXAM General: Well developed young caucasian  female, well nourished, in no acute distress. Sitting up in recliner.  HEENT:  Normocephalic and atraumatic Neck:  No JVD.  Lungs: normal respiratory effort on room air. Clear bilaterally to auscultation  Heart: HRRR . Normal S1 and S2 without gallops or murmurs.  Abdomen: nondistended appearing  Msk: Normal strength and tone for age. Extremities: No clubbing, cyanosis or edema.   Neuro: Alert and oriented X 3. Psych:  Good affect, responds appropriately  Labs:   Lab Results  Component Value Date   WBC 5.1 02/09/2023   HGB 12.6 02/09/2023   HCT 35.8 (L) 02/09/2023   MCV 89.7 02/09/2023   PLT 252 02/09/2023    Recent Labs  Lab 02/08/23 2008 02/09/23 0641  NA 139 138  K 3.8 3.6  CL 110 110  CO2 24 22  BUN 12 11  CREATININE 0.54 0.56  CALCIUM 8.9 8.4*  PROT 6.8  --   BILITOT 0.5  --   ALKPHOS 85  --   ALT 33  --   AST 27  --   GLUCOSE 94 101*    No results found for: "CKTOTAL", "CKMB", "CKMBINDEX", "TROPONINI" No results found for: "CHOL" No results found for: "HDL" No results found for: "LDLCALC" No results found for: "TRIG" No results found for: "CHOLHDL" No results found for: "LDLDIRECT"    Radiology: [facility] Chest Port 1 View  Result Date: 02/10/2023 CLINICAL DATA:  Dyspnea. EXAM: PORTABLE CHEST 1 VIEW COMPARISON:  February 08, 2023.  FINDINGS: The heart size and mediastinal contours are within normal limits. Both lungs are clear. The visualized skeletal structures are unremarkable. IMPRESSION: No active disease. Electronically Signed   By: Marijo Conception M.D.   On: 02/10/2023 09:38   ECHOCARDIOGRAM COMPLETE  Result Date: 02/09/2023    ECHOCARDIOGRAM REPORT   Patient Name:   Denise Bryan Date of Exam: 02/09/2023 Medical Rec #:  [id number]           Height:       63.0 in Accession #:    [id number]          Weight:       202.0 lb Date of Birth:  [birth year]           BSA:          1.942 m Patient Age:    36 years            BP:           94/51 mmHg Patient Gender: F                   HR:           83 bpm. Exam Location:  [facility] Procedure: 2D Echo Indications:     Dyspnea R06.00  History:         Patient has no prior history of Echocardiogram examinations.  Sonographer:     Kathlen Brunswick RDCS Referring Phys:  [place] Diagnosing Phys: Isaias Cowman MD IMPRESSIONS  1. Left ventricular ejection fraction, by estimation, is 60 to 65%. The left ventricle has normal function. The left ventricle has no regional wall motion abnormalities. Left ventricular diastolic parameters were normal.  2. Right ventricular systolic function is normal. The right ventricular size is normal.  3. The mitral valve is normal in structure. Trivial mitral valve regurgitation. No evidence of mitral stenosis.  4. The aortic valve is normal in structure. Aortic valve regurgitation is not visualized. No aortic stenosis is present.  5. The inferior vena cava is normal in size with greater than 50% respiratory variability, suggesting right atrial pressure of 3 mmHg. FINDINGS  Left Ventricle: Left ventricular ejection fraction, by estimation, is 60 to 65%. The left ventricle has normal function. The left ventricle has no regional wall motion abnormalities. The left ventricular internal cavity size was normal in size. There is  no left ventricular  hypertrophy. Left ventricular diastolic parameters were normal. Right Ventricle: The right ventricular size is normal. No increase in right ventricular wall thickness. Right ventricular systolic function is normal. Left Atrium: Left atrial size was normal in size. Right Atrium: Right atrial size was normal in size. Pericardium: There is no evidence of pericardial effusion. Mitral Valve: The mitral valve is normal in structure. Trivial mitral valve regurgitation. No evidence of mitral valve stenosis. Tricuspid Valve: The tricuspid valve is normal in structure. Tricuspid valve regurgitation is trivial. No evidence of tricuspid stenosis. Aortic Valve: The aortic valve is normal in structure. Aortic valve regurgitation is not visualized. No aortic stenosis is present. Aortic valve peak gradient measures 6.0 mmHg. Pulmonic Valve: The pulmonic valve was normal in structure. Pulmonic valve regurgitation is not visualized. No evidence of pulmonic stenosis. Aorta: The aortic root is normal in size and structure. Venous: The inferior vena cava is normal in size with greater than 50% respiratory variability, suggesting right atrial pressure of 3 mmHg. IAS/Shunts: No atrial level shunt detected by color flow Doppler.  LEFT VENTRICLE PLAX 2D LVIDd:         4.50 cm   Diastology LVIDs:         3.00 cm   LV e' medial:    14.90 cm/s LV PW:         0.90 cm   LV E/e' medial:  7.1 LV IVS:        1.00 cm   LV e' lateral:   16.50 cm/s LVOT diam:     1.80 cm   LV E/e' lateral: 6.4 LV SV:         48 LV SV Index:   25 LVOT Area:     2.54 cm  RIGHT VENTRICLE RV Basal diam:  2.70 cm RV S prime:     13.30 cm/s TAPSE (M-mode): 1.8 cm LEFT ATRIUM             Index        RIGHT ATRIUM           Index LA diam:        3.30 cm 1.70 cm/m   RA Area:     11.00 cm LA Vol (A2C):   33.4 ml 17.20 ml/m  RA Volume:   22.20 ml  11.43 ml/m LA Vol (A4C):   16.4 ml 8.45 ml/m LA Biplane Vol: 23.9 ml 12.31 ml/m  AORTIC VALVE                 PULMONIC VALVE  AV Area (Vmax): 2.01 cm     PV Vmax:        0.91 m/s AV Vmax:        122.00 cm/s  PV Peak grad:   3.3 mmHg AV Peak Grad:   6.0 mmHg     RVOT Peak grad: 2 mmHg LVOT Vmax:      96.60 cm/s LVOT Vmean:     59.400 cm/s LVOT VTI:       0.187 m  AORTA Ao Root diam: 3.00 cm Ao Asc diam:  2.30 cm MITRAL  VALVE                TRICUSPID VALVE MV Area (PHT): 3.93 cm     TV Peak grad:   22.5 mmHg MV Decel Time: 193 msec     TV Vmax:        2.37 m/s MV E velocity: 106.00 cm/s MV A velocity: 59.20 cm/s   SHUNTS MV E/A ratio:  1.79         Systemic VTI:  0.19 m                             Systemic Diam: 1.80 cm Isaias Cowman MD Electronically signed by Isaias Cowman MD Signature Date/Time: 02/09/2023/12:02:20 PM    Final    US Venous Img Lower Bilateral (DVT)  Result Date: 02/08/2023 CLINICAL DATA:  Lower extremity swelling EXAM: BILATERAL LOWER EXTREMITY VENOUS DOPPLER ULTRASOUND TECHNIQUE: Gray-scale sonography with graded compression, as well as color Doppler and duplex ultrasound were performed to evaluate the lower extremity deep venous systems from the level of the common femoral vein and including the common femoral, femoral, profunda femoral, popliteal and calf veins including the posterior tibial, peroneal and gastrocnemius veins when visible. The superficial great saphenous vein was also interrogated. Spectral Doppler was utilized to evaluate flow at rest and with distal augmentation maneuvers in the common femoral, femoral and popliteal veins. COMPARISON:  04/01/2015 FINDINGS: RIGHT LOWER EXTREMITY Common Femoral Vein: No evidence of thrombus. Normal compressibility, respiratory phasicity and response to augmentation. Saphenofemoral Junction: No evidence of thrombus. Normal compressibility and flow on color Doppler imaging. Profunda Femoral Vein: No evidence of thrombus. Normal compressibility and flow on color Doppler imaging. Femoral Vein: No evidence of thrombus. Normal compressibility, respiratory  phasicity and response to augmentation. Popliteal Vein: No evidence of thrombus. Normal compressibility, respiratory phasicity and response to augmentation. Calf Veins: Not well visualized Superficial Great Saphenous Vein: No evidence of thrombus. Normal compressibility. Venous Reflux:  None. Other Findings:  None. LEFT LOWER EXTREMITY Common Femoral Vein: No evidence of thrombus. Normal compressibility, respiratory phasicity and response to augmentation. Saphenofemoral Junction: No evidence of thrombus. Normal compressibility and flow on color Doppler imaging. Profunda Femoral Vein: No evidence of thrombus. Normal compressibility and flow on color Doppler imaging. Femoral Vein: No evidence of thrombus. Normal compressibility, respiratory phasicity and response to augmentation. Popliteal Vein: No evidence of thrombus. Normal compressibility, respiratory phasicity and response to augmentation. Calf Veins: Not well visualized Superficial Great Saphenous Vein: No evidence of thrombus. Normal compressibility. Venous Reflux:  None. Other Findings:  2.8 cm popliteal fossa collection is noted. IMPRESSION: No evidence of deep venous thrombosis in either lower extremity. Calf veins were not well seen. Left popliteal cyst. Electronically Signed   By: Inez Catalina M.D.   On: 02/08/2023 22:28   DG Chest 2 View  Result Date: 02/08/2023 CLINICAL DATA:  Shortness of breath. Swelling in the legs, face, and hands. Difficulty breathing for a month. EXAM: CHEST - 2 VIEW COMPARISON:  11/05/2016 FINDINGS: Heart size and pulmonary vascularity are normal. Lungs are clear. No pleural effusions. No pneumothorax. Mediastinal contours appear intact. Postoperative changes in the cervical spine. Surgical clips in the right upper quadrant. IMPRESSION: No active cardiopulmonary disease. Electronically Signed   By: Lucienne Capers M.D.   On: 02/08/2023 20:22    EKG: Normal sinus rhythm 82 bpm  ASSESSMENT AND PLAN:   1.  Dyspnea, with  associated 50-60 pound weight gain over 3  months, with 1+ peripheral edema, with normal ECG, negative chest x-ray, negative lower extremity ultrasound, normal troponin, mildly elevated BNP. No clinical improvement after addition of torsemide. 2.  Ependymoma involving medulla and spinal cord  Recommendations  1.  Agree with current therapy 2.  VQ scan without focal abnormalities or wedge shaped defects 3.  2D echocardiogram with normal systolic and diastolic function, EF [id number].  4.  Continue torsemide 20mg  daily 5.  No additional cardiac diagnostics recommended, consider noncardiac causes of the patients dyspnea.   Cardiology will sign off. Please haiku with questions or re-engage if needed.    This patient's plan of care was discussed and created with Dr. Clayborn Bigness and he is in agreement.    Signed: Alanson Puls Jontavius Rabalais PA-C 02/10/2023, 10:01 AM

## 2023-02-11 DIAGNOSIS — R0600 Dyspnea, unspecified: Secondary | ICD-10-CM | POA: Diagnosis not present

## 2023-02-11 DIAGNOSIS — R635 Abnormal weight gain: Secondary | ICD-10-CM | POA: Diagnosis not present

## 2023-02-11 DIAGNOSIS — F419 Anxiety disorder, unspecified: Secondary | ICD-10-CM | POA: Diagnosis not present

## 2023-02-11 DIAGNOSIS — C719 Malignant neoplasm of brain, unspecified: Secondary | ICD-10-CM | POA: Diagnosis not present

## 2023-02-11 LAB — CORTISOL-AM, BLOOD: Cortisol - AM: 8.7 ug/dL (ref 6.7–22.6)

## 2023-02-11 LAB — SEDIMENTATION RATE: Sed Rate: 7 mm/hr (ref 0–20)

## 2023-02-11 MED ORDER — HYDROXYZINE HCL 10 MG PO TABS: 10.0000 mg | ORAL_TABLET | Freq: Three times a day (TID) | ORAL | 0 refills | Status: AC | PRN

## 2023-02-11 MED ORDER — TORSEMIDE 20 MG PO TABS: 20.0000 mg | ORAL_TABLET | Freq: Every day | ORAL | 11 refills | Status: AC

## 2023-02-11 MED ORDER — HYDROXYZINE HCL 10 MG PO TABS: 10.0000 mg | ORAL_TABLET | Freq: Three times a day (TID) | ORAL | Status: DC | PRN

## 2023-02-11 NOTE — TOC CM/SW Note (Signed)
  Transition of Care [facility]) Screening Note   Patient Details  Name: Denise Bryan Date of Birth: Jul 23, 1986   Transition of Care [facility]) CM/SW Contact:    Candie Chroman, LCSW Phone Number: 02/11/2023, 1:43 PM    Transition of Care Department [facility] has reviewed patient and no TOC needs have been identified at this time. We will continue to monitor patient advancement through interdisciplinary progression rounds. If new patient transition needs arise, please place a TOC consult.

## 2023-02-11 NOTE — Progress Notes (Signed)
Mobility Specialist - Progress Note   02/11/23 1117  Mobility  Activity Ambulated independently in room  Level of Assistance Independent  Assistive Device None  Distance Ambulated (ft) 340 ft  Activity Response Tolerated well  $Mobility charge 1 Mobility   Pt amb in the bathroom upon entry no AD, utilizing RA. Pt amb two laps around NS, tolerated well. No LOB, dizziness or SOB expressed during amb. Pt returned to the room, left sitting in bed with needs within reach.   Candie Mile Mobility Specialist 02/11/23 11:21 AM

## 2023-02-12 ENCOUNTER — Other Ambulatory Visit (INDEPENDENT_AMBULATORY_CARE_PROVIDER_SITE_OTHER): Payer: Self-pay | Admitting: Nurse Practitioner

## 2023-02-12 DIAGNOSIS — R600 Localized edema: Secondary | ICD-10-CM

## 2023-02-13 NOTE — Discharge Summary (Signed)
Physician Discharge Summary   Patient: Denise Bryan MRN: [id number] DOB: Jul 13, 1986  Admit date:     02/08/2023  Discharge date: 02/11/2023  Discharge Physician: Denise Bryan   PCP: [facility] [place]   Recommendations at discharge:  Please follow up with PCP In one week.  Please follow up cbc and bmp in one week.   Discharge Diagnoses: Principal Problem:   Dyspnea Active Problems:   Abnormal weight gain   Ependymoma (HCC)   Anxiety  Resolved Problems:   * No resolved hospital problems. *  Hospital Course: 37 y.o. Caucasian female with medical history significant for medullary ependymoma, anxiety, endometriosis, and recent abnormal weight gain, who presented to the emergency room with acute onset of dyspnea on exertion and whole body swelling.  She gained about 64 pounds over the last 3 months.  She had outpatient work By her PCP.  She was initially given a prescription for Lasix 40 mg p.o. daily as it was later seen by her [facility] clinic cardiologist who added Zaroxolyn and potassium chloride.  After taking them she experienced dyspnea and presyncope and pallor and was advised to stop them.  She was then prescribed p.o. torsemide that she has not started. In view of her persistent symptoms, she was asked to come to ED for further evaluation.       Assessment and Plan:   Dyspnea on exertion, elevated d dimer and abnormal weight gain:  Ruled out PE with neg V/Q SCAN.  Echocardiogram wnl.  Troponins negative.  Albumin wnl. TSH wnl.  UA and urine protein is negative. Am cortisol is negative. ANA ordered . ESR are negative.  Her symptoms have improved with an extra dose of torsemide.  Cardiology consulted and ruled out cardiac etiology.      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 - She is followed at [facility] for it.  -  No current neurological deficits and no worsening headaches.   Anxiety Resume home meds.       Consultants: cardiology.  Procedures performed: echocardiorgam.   Disposition: Home Diet recommendation:  Discharge Diet Orders (From admission, onward)     Start     Ordered   02/11/23 0000  Diet - low sodium heart healthy        02/11/23 1225           Regular diet DISCHARGE MEDICATION: Allergies as of 02/11/2023       Reactions   Eicosapentaenoic Acid (epa) Anaphylaxis   Fish-derived Products Anaphylaxis   Iodine Anaphylaxis   Tramadol Anaphylaxis, Hives        Medication List     STOP taking these medications    diazepam 5 MG tablet Commonly known as: VALIUM   furosemide 40 MG tablet Commonly known as: LASIX   metolazone 5 MG tablet Commonly known as: ZAROXOLYN   potassium chloride SA 20 MEQ tablet Commonly known as: KLOR-CON M       TAKE these medications    hydrOXYzine 10 MG tablet Commonly known as: ATARAX Take 1 tablet (10 mg total) by mouth 3 (three) times daily as needed for anxiety.   ketoconazole 2 % shampoo Commonly known as: NIZORAL Apply 1 Application topically 2 (two) times a week.   oxyCODONE 5 MG immediate release tablet Commonly known as: Oxy IR/ROXICODONE Take 5 mg by mouth every 8 (eight) hours as needed.   torsemide 20 MG tablet Commonly known as: DEMADEX Take 1 tablet (20 mg  total) by mouth daily.        Follow-up Information     Denise Cowman, MD Follow up on 02/26/2023.   Specialty: Cardiology Why: You already have a appointment 02/26/23 at 9:30am. Contact information: [facility] [place] [address] [phone]                Discharge Exam: Danley Danker Weights   02/08/23 2001  Weight: 91.6 kg   General exam: Appears calm and comfortable  Respiratory system: Clear to auscultation. Respiratory effort normal. Cardiovascular system: S1 & S2 heard, RRR. No JVD, murmurs, rubs, gallops or clicks. No pedal edema. Gastrointestinal system: Abdomen is  nondistended, soft and nontender. No organomegaly or masses felt. Normal bowel sounds heard. Central nervous system: Alert and oriented. No focal neurological deficits. Extremities: Symmetric 5 x 5 power. Skin: No rashes, lesions or ulcers Psychiatry: Judgement and insight appear normal. Mood & affect appropriate.    Condition at discharge: fair  The results of significant diagnostics from this hospitalization (including imaging, microbiology, ancillary and laboratory) are listed below for reference.   Imaging Studies: NM Pulmonary Perfusion  Result Date: 02/10/2023 CLINICAL DATA:  High clinical suspicion for pulmonary embolism EXAM: NUCLEAR MEDICINE PERFUSION LUNG SCAN TECHNIQUE: Perfusion images were obtained in multiple projections after intravenous injection of radiopharmaceutical. Ventilation scans intentionally deferred if perfusion scan and chest x-ray adequate for interpretation during COVID 19 epidemic. RADIOPHARMACEUTICALS:  4.37 mCi Tc-55m MAA IV COMPARISON:  None Available. FINDINGS: There are no wedge-shaped perfusion defects. IMPRESSION: No focal abnormalities are seen in perfusion lung scan. Electronically Signed   By: Elmer Picker M.D.   On: 02/10/2023 10:11   DG Chest Port 1 View  Result Date: 02/10/2023 CLINICAL DATA:  Dyspnea. EXAM: PORTABLE CHEST 1 VIEW COMPARISON:  February 08, 2023. FINDINGS: The heart size and mediastinal contours are within normal limits. Both lungs are clear. The visualized skeletal structures are unremarkable. IMPRESSION: No active disease. Electronically Signed   By: Marijo Conception M.D.   On: 02/10/2023 09:38   ECHOCARDIOGRAM COMPLETE  Result Date: 02/09/2023    ECHOCARDIOGRAM REPORT   Patient Name:   Denise Bryan Date of Exam: 02/09/2023 Medical Rec #:  [id number]           Height:       63.0 in Accession #:    [id number]          Weight:       202.0 lb Date of Birth:  1986/02/29           BSA:          1.942 m Patient Age:    37 years             BP:           94/51 mmHg Patient Gender: F                   HR:           83 bpm. Exam Location:  [facility] Procedure: 2D Echo Indications:     Dyspnea R06.00  History:         Patient has no prior history of Echocardiogram examinations.  Sonographer:     Kathlen Brunswick RDCS Referring Phys:  [place] Diagnosing Phys: Denise Cowman MD IMPRESSIONS  1. Left ventricular ejection fraction, by estimation, is 60 to 65%. The left ventricle has normal function. The left ventricle has no regional wall motion abnormalities. Left ventricular  diastolic parameters were normal.  2. Right ventricular systolic function is normal. The right ventricular size is normal.  3. The mitral valve is normal in structure. Trivial mitral valve regurgitation. No evidence of mitral stenosis.  4. The aortic valve is normal in structure. Aortic valve regurgitation is not visualized. No aortic stenosis is present.  5. The inferior vena cava is normal in size with greater than 50% respiratory variability, suggesting right atrial pressure of 3 mmHg. FINDINGS  Left Ventricle: Left ventricular ejection fraction, by estimation, is 60 to 65%. The left ventricle has normal function. The left ventricle has no regional wall motion abnormalities. The left ventricular internal cavity size was normal in size. There is  no left ventricular hypertrophy. Left ventricular diastolic parameters were normal. Right Ventricle: The right ventricular size is normal. No increase in right ventricular wall thickness. Right ventricular systolic function is normal. Left Atrium: Left atrial size was normal in size. Right Atrium: Right atrial size was normal in size. Pericardium: There is no evidence of pericardial effusion. Mitral Valve: The mitral valve is normal in structure. Trivial mitral valve regurgitation. No evidence of mitral valve stenosis. Tricuspid Valve: The tricuspid valve is normal in structure. Tricuspid valve regurgitation is  trivial. No evidence of tricuspid stenosis. Aortic Valve: The aortic valve is normal in structure. Aortic valve regurgitation is not visualized. No aortic stenosis is present. Aortic valve peak gradient measures 6.0 mmHg. Pulmonic Valve: The pulmonic valve was normal in structure. Pulmonic valve regurgitation is not visualized. No evidence of pulmonic stenosis. Aorta: The aortic root is normal in size and structure. Venous: The inferior vena cava is normal in size with greater than 50% respiratory variability, suggesting right atrial pressure of 3 mmHg. IAS/Shunts: No atrial level shunt detected by color flow Doppler.  LEFT VENTRICLE PLAX 2D LVIDd:         4.50 cm   Diastology LVIDs:         3.00 cm   LV e' medial:    14.90 cm/s LV PW:         0.90 cm   LV E/e' medial:  7.1 LV IVS:        1.00 cm   LV e' lateral:   16.50 cm/s LVOT diam:     1.80 cm   LV E/e' lateral: 6.4 LV SV:         48 LV SV Index:   25 LVOT Area:     2.54 cm  RIGHT VENTRICLE RV Basal diam:  2.70 cm RV S prime:     13.30 cm/s TAPSE (M-mode): 1.8 cm LEFT ATRIUM             Index        RIGHT ATRIUM           Index LA diam:        3.30 cm 1.70 cm/m   RA Area:     11.00 cm LA Vol (A2C):   33.4 ml 17.20 ml/m  RA Volume:   22.20 ml  11.43 ml/m LA Vol (A4C):   16.4 ml 8.45 ml/m LA Biplane Vol: 23.9 ml 12.31 ml/m  AORTIC VALVE                 PULMONIC VALVE AV Area (Vmax): 2.01 cm     PV Vmax:        0.91 m/s AV Vmax:        122.00 cm/s  PV Peak grad:   3.3 mmHg AV  Peak Grad:   6.0 mmHg     RVOT Peak grad: 2 mmHg LVOT Vmax:      96.60 cm/s LVOT Vmean:     59.400 cm/s LVOT VTI:       0.187 m  AORTA Ao Root diam: 3.00 cm Ao Asc diam:  2.30 cm MITRAL VALVE                TRICUSPID VALVE MV Area (PHT): 3.93 cm     TV Peak grad:   22.5 mmHg MV Decel Time: 193 msec     TV Vmax:        2.37 m/s MV E velocity: 106.00 cm/s MV A velocity: 59.20 cm/s   SHUNTS MV E/A ratio:  1.79         Systemic VTI:  0.19 m                             Systemic Diam: 1.80  cm Denise Cowman MD Electronically signed by Denise Cowman MD Signature Date/Time: 02/09/2023/12:02:20 PM    Final    US Venous Img Lower Bilateral (DVT)  Result Date: 02/08/2023 CLINICAL DATA:  Lower extremity swelling EXAM: BILATERAL LOWER EXTREMITY VENOUS DOPPLER ULTRASOUND TECHNIQUE: Gray-scale sonography with graded compression, as well as color Doppler and duplex ultrasound were performed to evaluate the lower extremity deep venous systems from the level of the common femoral vein and including the common femoral, femoral, profunda femoral, popliteal and calf veins including the posterior tibial, peroneal and gastrocnemius veins when visible. The superficial great saphenous vein was also interrogated. Spectral Doppler was utilized to evaluate flow at rest and with distal augmentation maneuvers in the common femoral, femoral and popliteal veins. COMPARISON:  04/01/2015 FINDINGS: RIGHT LOWER EXTREMITY Common Femoral Vein: No evidence of thrombus. Normal compressibility, respiratory phasicity and response to augmentation. Saphenofemoral Junction: No evidence of thrombus. Normal compressibility and flow on color Doppler imaging. Profunda Femoral Vein: No evidence of thrombus. Normal compressibility and flow on color Doppler imaging. Femoral Vein: No evidence of thrombus. Normal compressibility, respiratory phasicity and response to augmentation. Popliteal Vein: No evidence of thrombus. Normal compressibility, respiratory phasicity and response to augmentation. Calf Veins: Not well visualized Superficial Great Saphenous Vein: No evidence of thrombus. Normal compressibility. Venous Reflux:  None. Other Findings:  None. LEFT LOWER EXTREMITY Common Femoral Vein: No evidence of thrombus. Normal compressibility, respiratory phasicity and response to augmentation. Saphenofemoral Junction: No evidence of thrombus. Normal compressibility and flow on color Doppler imaging. Profunda Femoral Vein: No evidence  of thrombus. Normal compressibility and flow on color Doppler imaging. Femoral Vein: No evidence of thrombus. Normal compressibility, respiratory phasicity and response to augmentation. Popliteal Vein: No evidence of thrombus. Normal compressibility, respiratory phasicity and response to augmentation. Calf Veins: Not well visualized Superficial Great Saphenous Vein: No evidence of thrombus. Normal compressibility. Venous Reflux:  None. Other Findings:  2.8 cm popliteal fossa collection is noted. IMPRESSION: No evidence of deep venous thrombosis in either lower extremity. Calf veins were not well seen. Left popliteal cyst. Electronically Signed   By: Inez Catalina M.D.   On: 02/08/2023 22:28   DG Chest 2 View  Result Date: 02/08/2023 CLINICAL DATA:  Shortness of breath. Swelling in the legs, face, and hands. Difficulty breathing for a month. EXAM: CHEST - 2 VIEW COMPARISON:  11/05/2016 FINDINGS: Heart size and pulmonary vascularity are normal. Lungs are clear. No pleural effusions. No pneumothorax. Mediastinal contours appear intact. Postoperative changes  in the cervical spine. Surgical clips in the right upper quadrant. IMPRESSION: No active cardiopulmonary disease. Electronically Signed   By: Lucienne Capers M.D.   On: 02/08/2023 20:22    Microbiology: Results for orders placed or performed during the hospital encounter of 10/09/20  SARS CORONAVIRUS 2 (TAT 6-24 HRS) Nasopharyngeal Nasopharyngeal Swab     Status: None   Collection Time: 10/09/20  9:20 AM   Specimen: Nasopharyngeal Swab  Result Value Ref Range Status   SARS Coronavirus 2 NEGATIVE NEGATIVE Final    Comment: (NOTE) SARS-CoV-2 target nucleic acids are NOT DETECTED.  The SARS-CoV-2 RNA is generally detectable in upper and lower respiratory specimens during the acute phase of infection. Negative results do not preclude SARS-CoV-2 infection, do not rule out co-infections with other pathogens, and should not be used as the sole basis for  treatment or other patient management decisions. Negative results must be combined with clinical observations, patient history, and epidemiological information. The expected result is Negative.  Fact Sheet for Patients: SugarRoll.be  Fact Sheet for Healthcare Providers: https://www.woods-mathews.com/  This test is not yet approved or cleared by the [place] FDA and  has been authorized for detection and/or diagnosis of SARS-CoV-2 by FDA under an Emergency Use Authorization (EUA). This EUA will remain  in effect (meaning this test can be used) for the duration of the COVID-19 declaration under Se ction 564(b)(1) of the Act, 21 U.S.C. section 360bbb-3(b)(1), unless the authorization is terminated or revoked sooner.  Performed at [facility], [place] [address] [place], [place] [address]     Labs: CBC: Recent Labs  Lab 02/08/23 2357 02/09/23 0641  WBC 7.0 5.1  NEUTROABS 4.6  --   HGB 13.4 12.6  HCT 38.0 35.8*  MCV 89.6 89.7  PLT 264 [id number]   Basic Metabolic Panel: Recent Labs  Lab 02/08/23 2008 02/09/23 0641 02/10/23 1049  NA 139 138 138  K 3.8 3.6 4.0  CL 110 110 100  CO2 24 22 26   GLUCOSE 94 101* 124*  BUN 12 11 10   CREATININE 0.54 0.56 0.65  CALCIUM 8.9 8.4* 9.6   Liver Function Tests: Recent Labs  Lab 02/08/23 2008  AST 27  ALT 33  ALKPHOS 85  BILITOT 0.5  PROT 6.8  ALBUMIN 3.8   CBG: No results for input(s): "GLUCAP" in the last 168 hours.  Discharge time spent: 39 minutes.   Signed: Hosie Poisson, MD [facility] 02/13/2023

## 2023-02-19 DIAGNOSIS — I8310 Varicose veins of unspecified lower extremity with inflammation: Secondary | ICD-10-CM | POA: Insufficient documentation

## 2023-02-19 NOTE — Progress Notes (Signed)
MRN : [id number]  Denise Bryan is a 37 y.o. (05-07-1986) female who presents with chief complaint of varicose veins hurt.  History of Present Illness:   Patient is seen for evaluation of leg pain and leg swelling.  More recently she has noted that her edema is including her arms and especially both hands.  The patient first noticed the swelling remotely. The swelling is associated with pain and some discoloration. The pain and swelling worsens with prolonged dependency and improves with elevation. The pain is unrelated to activity.  The patient notes that in the morning the legs are significantly improved but they steadily worsened throughout the course of the day. The patient also notes a steady worsening of the discoloration in the ankle and shin area.   The patient denies claudication symptoms.  The patient denies symptoms consistent with rest pain.  The patient denies and extensive history of DJD and LS spine disease.  The patient has no had any past angiography, interventions or vascular surgery.  Elevation makes the leg symptoms better, dependency makes them much worse. There is no history of ulcerations. The patient denies any recent changes in medications.  The patient has not been wearing graduated compression.  The patient denies a history of DVT or PE. There is no prior history of phlebitis. There is no history of primary lymphedema.  No history of malignancies. No history of trauma or groin or pelvic surgery. There is no history of radiation treatment to the groin or pelvis  The patient denies amaurosis fugax or recent TIA symptoms. There are no recent neurological changes noted. The patient denies recent episodes of angina or shortness of breath.  Duplex ultrasound of the venous system bilateral lower extremities demonstrates a widely patent deep venous system.  There is no reflux noted in the superficial venous system.  Essentially a normal studies.  No  outpatient medications have been marked as taking for the 02/20/23 encounter (Appointment) with Gilda Crease, Latina Craver, MD.    Past Medical History:  Diagnosis Date   Anxiety    Brain cancer [facility])    Dyspnea    Endometriosis    Family history of ovarian cancer    Gastroesophageal reflux disease 09/20/2008   no problems since gallbladder taken out!! 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HCC)    Neuromuscular disorder (HCC)    Spontaneous abortion    Unspecified blood type, rh negative     Past Surgical History:  Procedure Laterality Date   BRAIN TUMOR EXCISION     CHOLECYSTECTOMY  2007   CYSTOSCOPY N/A 09/01/2018   Procedure: CYSTOSCOPY;  Surgeon: Conard Novak, MD;  Location: [facility] ORS;  Service: Gynecology;  Laterality: N/A;   DIAGNOSTIC LAPAROSCOPY  04/26/2010   IUD EXPULSION/PELVIC PAIN   DILATION AND CURETTAGE OF UTERUS  08/10/10; 08/06/07   HYDATIIDIFORM MOLE/POC, POC   DIRECT LARYNGOSCOPY N/A 10/10/2020   Procedure: INJECTION LARYNGOPLASTY C VOICE GEL, LEFT ONE VOCAL FOLD;  Surgeon: Bud Face, MD;  Location: [facility] OR[facility];  Service: ENT;  Laterality: N/A;   gallstones  08/05/2006   HYSTEROSCOPY  04/26/2010   IUD EXPULSION /PELVIC PAIN   LAPAROSCOPIC HYSTERECTOMY N/A 09/01/2018   Procedure: HYSTERECTOMY TOTAL LAPAROSCOPIC BILATERAL SALPINGECTOMY;  Surgeon: Conard Novak, MD;  Location: [facility] OR[facility];  Service: Gynecology;  Laterality: N/A;   LAPAROSCOPY N/A 08/21/2017   Procedure: LAPAROSCOPY DIAGNOSTIC;  Surgeon: Conard Novak, MD;  Location: [facility];  Service: Gynecology;  Laterality: N/A;   LAPAROSCOPY N/A 07/30/2018   Procedure: LAPAROSCOPY DIAGNOSTIC WITH BILATERAL SALPINGECTOMY;  Surgeon: Conard Novak, MD;  Location: [facility];  Service: Gynecology;  Laterality: N/A;   SPINAL FUSION  2021   TUBAL  LIGATION  01/2012   SDJ    Social History Social History   Tobacco Use   Smoking status: Never   Smokeless tobacco: Never  Vaping Use   Vaping Use: Never used  Substance Use Topics   Alcohol use: No   Drug use: No    Family History Family History  Problem Relation Age of Onset   Diabetes Mother    Hypertension Mother    Cancer Mother 68       UTERINE   Colon cancer Mother    Cancer Sister 20       CX   Diabetes Maternal Uncle    Diabetes Maternal Grandmother    Hypertension Maternal Grandmother    Lung disease Maternal Grandfather    Cancer Maternal Grandfather        LUNG   Heart disease Paternal Grandmother    Hypertension Paternal Grandmother     Allergies  Allergen Reactions   Eicosapentaenoic Acid (Epa) Anaphylaxis   Fish-Derived Products Anaphylaxis   Iodine Anaphylaxis   Tramadol Anaphylaxis and Hives     REVIEW OF SYSTEMS (Negative unless checked)  Constitutional: Weight loss  Fever  Chills Cardiac: Chest pain   Chest pressure   Palpitations   Shortness of breath when laying flat   Shortness of breath with exertion. Vascular:  Pain in legs with walking   Pain in legs with standing  History of DVT   Phlebitis   Swelling in legs   Varicose veins   Non-healing ulcers Pulmonary:   Uses home oxygen   Productive cough   Hemoptysis   Wheeze  COPD   Asthma Neurologic:  Dizziness   Seizures   History of stroke   History of TIA  Aphasia   Vissual changes   Weakness or numbness in arm   Weakness or numbness in leg Musculoskeletal:   Joint swelling   Joint pain   Low back pain Hematologic:  Easy bruising  Easy bleeding   Hypercoagulable state   Anemic Gastrointestinal:  Diarrhea   Vomiting  Gastroesophageal reflux/heartburn   Difficulty swallowing. Genitourinary:  Chronic kidney disease   Difficult urination  Frequent urination   Blood in urine Skin:  Rashes   Ulcers   Psychological:  History of anxiety    History of major depression.  Physical Examination  There were no vitals filed for this visit. There is no height or weight on file to calculate BMI. Gen: WD/WN, NAD Head: [place]/AT, No temporalis wasting.  Ear/Nose/Throat: Hearing grossly intact, nares w/o erythema or drainage, pinna without lesions Eyes: PER, EOMI, sclera nonicteric.  Neck: Supple, no gross masses.  No JVD.  Pulmonary:  Good air movement, no audible wheezing, no use of accessory muscles.  Cardiac: RRR, precordium not hyperdynamic. Vascular:  Mild venous stasis changes to the legs bilaterally.  3-4+ pitting edema CEAP C4sEpAsPr Vessel Right Left  Radial Palpable Palpable  Gastrointestinal: soft, non-distended. No guarding/no peritoneal signs.  Musculoskeletal: M/S 5/5 throughout.  No deformity.  Neurologic: CN 2-12 intact. Pain and light touch intact in extremities.  Symmetrical.  Speech is fluent. Motor  exam as listed above. Psychiatric: Judgment intact, Mood & affect appropriate for pt's clinical situation. Dermatologic: Venous rashes no ulcers noted.  No changes consistent with cellulitis. Lymph : No lichenification or skin changes of chronic lymphedema.  CBC Lab Results  Component Value Date   WBC 5.1 02/09/2023   HGB 12.6 02/09/2023   HCT 35.8 (L) 02/09/2023   MCV 89.7 02/09/2023   PLT 252 02/09/2023    BMET    Component Value Date/Time   NA 138 02/10/2023 1049   NA 137 12/22/2012 1720   K 4.0 02/10/2023 1049   K 4.1 12/22/2012 1720   CL 100 02/10/2023 1049   CL 105 12/22/2012 1720   CO2 26 02/10/2023 1049   CO2 27 12/22/2012 1720   GLUCOSE 124 (H) 02/10/2023 1049   GLUCOSE 87 12/22/2012 1720   BUN 10 02/10/2023 1049   BUN 10 12/22/2012 1720   CREATININE 0.65 02/10/2023 1049   CREATININE 0.52 (L) 12/22/2012 1720   CALCIUM 9.6 02/10/2023 1049   CALCIUM 9.1 12/22/2012 1720   GFRNONAA >60 02/10/2023 1049   GFRNONAA >60 12/22/2012 1720   GFRAA >60  04/02/2019 1337   GFRAA >60 12/22/2012 1720   Estimated Creatinine Clearance: 106.5 mL/min (by C-G formula based on SCr of 0.65 mg/dL).  COAG Lab Results  Component Value Date   INR 1.1 02/09/2023    Radiology NM Pulmonary Perfusion  Result Date: 02/10/2023 CLINICAL DATA:  High clinical suspicion for pulmonary embolism EXAM: NUCLEAR MEDICINE PERFUSION LUNG SCAN TECHNIQUE: Perfusion images were obtained in multiple projections after intravenous injection of radiopharmaceutical. Ventilation scans intentionally deferred if perfusion scan and chest x-ray adequate for interpretation during COVID 19 epidemic. RADIOPHARMACEUTICALS:  4.37 mCi Tc-67m MAA IV COMPARISON:  None Available. FINDINGS: There are no wedge-shaped perfusion defects. IMPRESSION: No focal abnormalities are seen in perfusion lung scan. Electronically Signed   By: Ernie Avena M.D.   On: 02/10/2023 10:11   DG Chest Port 1 View  Result Date: 02/10/2023 CLINICAL DATA:  Dyspnea. EXAM: PORTABLE CHEST 1 VIEW COMPARISON:  February 08, 2023. FINDINGS: The heart size and mediastinal contours are within normal limits. Both lungs are clear. The visualized skeletal structures are unremarkable. IMPRESSION: No active disease. Electronically Signed   By: Lupita Raider M.D.   On: 02/10/2023 09:38   ECHOCARDIOGRAM COMPLETE  Result Date: 02/09/2023    ECHOCARDIOGRAM REPORT   Patient Name:   COLLEEN VANLOAN Sidney Date of Exam: 02/09/2023 Medical Rec #:  [id number]           Height:       63.0 in Accession #:    [id number]          Weight:       202.0 lb Date of Birth:  03-Jan-1986           BSA:          1.942 m Patient Age:    36 years            BP:           94/51 mmHg Patient Gender: F                   HR:           83 bpm. Exam Location:  [facility] Procedure: 2D Echo Indications:     Dyspnea R06.00  History:         Patient has no prior history of Echocardiogram examinations.  Sonographer:  Overton Mam RDCS Referring Phys:  [id number] Lyn Hollingshead  PARASCHOS Diagnosing Phys: Marcina Millard MD IMPRESSIONS  1. Left ventricular ejection fraction, by estimation, is 60 to 65%. The left ventricle has normal function. The left ventricle has no regional wall motion abnormalities. Left ventricular diastolic parameters were normal.  2. Right ventricular systolic function is normal. The right ventricular size is normal.  3. The mitral valve is normal in structure. Trivial mitral valve regurgitation. No evidence of mitral stenosis.  4. The aortic valve is normal in structure. Aortic valve regurgitation is not visualized. No aortic stenosis is present.  5. The inferior vena cava is normal in size with greater than 50% respiratory variability, suggesting right atrial pressure of 3 mmHg. FINDINGS  Left Ventricle: Left ventricular ejection fraction, by estimation, is 60 to 65%. The left ventricle has normal function. The left ventricle has no regional wall motion abnormalities. The left ventricular internal cavity size was normal in size. There is  no left ventricular hypertrophy. Left ventricular diastolic parameters were normal. Right Ventricle: The right ventricular size is normal. No increase in right ventricular wall thickness. Right ventricular systolic function is normal. Left Atrium: Left atrial size was normal in size. Right Atrium: Right atrial size was normal in size. Pericardium: There is no evidence of pericardial effusion. Mitral Valve: The mitral valve is normal in structure. Trivial mitral valve regurgitation. No evidence of mitral valve stenosis. Tricuspid Valve: The tricuspid valve is normal in structure. Tricuspid valve regurgitation is trivial. No evidence of tricuspid stenosis. Aortic Valve: The aortic valve is normal in structure. Aortic valve regurgitation is not visualized. No aortic stenosis is present. Aortic valve peak gradient measures 6.0 mmHg. Pulmonic Valve: The pulmonic valve was normal in structure. Pulmonic valve regurgitation is not  visualized. No evidence of pulmonic stenosis. Aorta: The aortic root is normal in size and structure. Venous: The inferior vena cava is normal in size with greater than 50% respiratory variability, suggesting right atrial pressure of 3 mmHg. IAS/Shunts: No atrial level shunt detected by color flow Doppler.  LEFT VENTRICLE PLAX 2D LVIDd:         4.50 cm   Diastology LVIDs:         3.00 cm   LV e' medial:    14.90 cm/s LV PW:         0.90 cm   LV E/e' medial:  7.1 LV IVS:        1.00 cm   LV e' lateral:   16.50 cm/s LVOT diam:     1.80 cm   LV E/e' lateral: 6.4 LV SV:         48 LV SV Index:   25 LVOT Area:     2.54 cm  RIGHT VENTRICLE RV Basal diam:  2.70 cm RV S prime:     13.30 cm/s TAPSE (M-mode): 1.8 cm LEFT ATRIUM             Index        RIGHT ATRIUM           Index LA diam:        3.30 cm 1.70 cm/m   RA Area:     11.00 cm LA Vol (A2C):   33.4 ml 17.20 ml/m  RA Volume:   22.20 ml  11.43 ml/m LA Vol (A4C):   16.4 ml 8.45 ml/m LA Biplane Vol: 23.9 ml 12.31 ml/m  AORTIC VALVE  PULMONIC VALVE AV Area (Vmax): 2.01 cm     PV Vmax:        0.91 m/s AV Vmax:        122.00 cm/s  PV Peak grad:   3.3 mmHg AV Peak Grad:   6.0 mmHg     RVOT Peak grad: 2 mmHg LVOT Vmax:      96.60 cm/s LVOT Vmean:     59.400 cm/s LVOT VTI:       0.187 m  AORTA Ao Root diam: 3.00 cm Ao Asc diam:  2.30 cm MITRAL VALVE                TRICUSPID VALVE MV Area (PHT): 3.93 cm     TV Peak grad:   22.5 mmHg MV Decel Time: 193 msec     TV Vmax:        2.37 m/s MV E velocity: 106.00 cm/s MV A velocity: 59.20 cm/s   SHUNTS MV E/A ratio:  1.79         Systemic VTI:  0.19 m                             Systemic Diam: 1.80 cm Marcina Millard MD Electronically signed by Marcina Millard MD Signature Date/Time: 02/09/2023/12:02:20 PM    Final    US Venous Img Lower Bilateral (DVT)  Result Date: 02/08/2023 CLINICAL DATA:  Lower extremity swelling EXAM: BILATERAL LOWER EXTREMITY VENOUS DOPPLER ULTRASOUND TECHNIQUE: Gray-scale  sonography with graded compression, as well as color Doppler and duplex ultrasound were performed to evaluate the lower extremity deep venous systems from the level of the common femoral vein and including the common femoral, femoral, profunda femoral, popliteal and calf veins including the posterior tibial, peroneal and gastrocnemius veins when visible. The superficial great saphenous vein was also interrogated. Spectral Doppler was utilized to evaluate flow at rest and with distal augmentation maneuvers in the common femoral, femoral and popliteal veins. COMPARISON:  04/01/2015 FINDINGS: RIGHT LOWER EXTREMITY Common Femoral Vein: No evidence of thrombus. Normal compressibility, respiratory phasicity and response to augmentation. Saphenofemoral Junction: No evidence of thrombus. Normal compressibility and flow on color Doppler imaging. Profunda Femoral Vein: No evidence of thrombus. Normal compressibility and flow on color Doppler imaging. Femoral Vein: No evidence of thrombus. Normal compressibility, respiratory phasicity and response to augmentation. Popliteal Vein: No evidence of thrombus. Normal compressibility, respiratory phasicity and response to augmentation. Calf Veins: Not well visualized Superficial Great Saphenous Vein: No evidence of thrombus. Normal compressibility. Venous Reflux:  None. Other Findings:  None. LEFT LOWER EXTREMITY Common Femoral Vein: No evidence of thrombus. Normal compressibility, respiratory phasicity and response to augmentation. Saphenofemoral Junction: No evidence of thrombus. Normal compressibility and flow on color Doppler imaging. Profunda Femoral Vein: No evidence of thrombus. Normal compressibility and flow on color Doppler imaging. Femoral Vein: No evidence of thrombus. Normal compressibility, respiratory phasicity and response to augmentation. Popliteal Vein: No evidence of thrombus. Normal compressibility, respiratory phasicity and response to augmentation. Calf Veins:  Not well visualized Superficial Great Saphenous Vein: No evidence of thrombus. Normal compressibility. Venous Reflux:  None. Other Findings:  2.8 cm popliteal fossa collection is noted. IMPRESSION: No evidence of deep venous thrombosis in either lower extremity. Calf veins were not well seen. Left popliteal cyst. Electronically Signed   By: Alcide Clever M.D.   On: 02/08/2023 22:28   DG Chest 2 View  Result Date: 02/08/2023 CLINICAL DATA:  Shortness of breath.  Swelling in the legs, face, and hands. Difficulty breathing for a month. EXAM: CHEST - 2 VIEW COMPARISON:  11/05/2016 FINDINGS: Heart size and pulmonary vascularity are normal. Lungs are clear. No pleural effusions. No pneumothorax. Mediastinal contours appear intact. Postoperative changes in the cervical spine. Surgical clips in the right upper quadrant. IMPRESSION: No active cardiopulmonary disease. Electronically Signed   By: Burman Nieves M.D.   On: 02/08/2023 20:22     Assessment/Plan 1. Lymphedema Recommend:  No surgery or intervention at this point in time.   The Patient is CEAP C4sEpAsPr.  The patient has been wearing compression for more than 12 weeks with no or little benefit.  The patient has been exercising daily for more than 12 weeks. The patient has been elevating and taking OTC pain medications for more than 12 weeks.  None of these have have eliminated the pain related to the lymphedema or the discomfort regarding excessive swelling and venous congestion.    I have reviewed my discussion with the patient regarding lymphedema and why it  causes symptoms.  Patient will continue wearing graduated compression on a daily basis. The patient should put the compression on first thing in the morning and removing them in the evening. The patient should not sleep in the compression.   In addition, behavioral modification throughout the day will be continued.  This will include frequent elevation (such as in a recliner), use of over  the counter pain medications as needed and exercise such as walking.  The systemic causes for chronic edema such as liver, kidney and cardiac etiologies do not appear to have significant changed over the past year.    The patient has chronic , severe lymphedema with hyperpigmentation of the skin and has done MLD, skin care, medication, diet, exercise, elevation and compression for 4 weeks with no improvement,  I am recommending a lymphedema pump.  The patient still has stage 3 lymphedema and therefore, I believe that a lymph pump is needed to improve the control of the patient's lymphedema and improve the quality of life.  Additionally, a lymph pump is warranted because it will reduce the risk of cellulitis and ulceration in the future.  Patient should follow-up in six months  - Ambulatory referral to Occupational Therapy  2. Chronic venous insufficiency Recommend:  No surgery or intervention at this point in time.   The Patient is CEAP C4sEpAsPr.  The patient has been wearing compression for more than 12 weeks with no or little benefit.  The patient has been exercising daily for more than 12 weeks. The patient has been elevating and taking OTC pain medications for more than 12 weeks.  None of these have have eliminated the pain related to the lymphedema or the discomfort regarding excessive swelling and venous congestion.    I have reviewed my discussion with the patient regarding lymphedema and why it  causes symptoms.  Patient will continue wearing graduated compression on a daily basis. The patient should put the compression on first thing in the morning and removing them in the evening. The patient should not sleep in the compression.   In addition, behavioral modification throughout the day will be continued.  This will include frequent elevation (such as in a recliner), use of over the counter pain medications as needed and exercise such as walking.  The systemic causes for chronic edema  such as liver, kidney and cardiac etiologies do not appear to have significant changed over the past year.    The  patient has chronic , severe lymphedema with hyperpigmentation of the skin and has done MLD, skin care, medication, diet, exercise, elevation and compression for 4 weeks with no improvement,  I am recommending a lymphedema pump.  The patient still has stage 3 lymphedema and therefore, I believe that a lymph pump is needed to improve the control of the patient's lymphedema and improve the quality of life.  Additionally, a lymph pump is warranted because it will reduce the risk of cellulitis and ulceration in the future.  Patient should follow-up in six months  - Ambulatory referral to Occupational Therapy  3. Gastroesophageal reflux disease, unspecified whether esophagitis present Continue PPI as already ordered, this medication has been reviewed and there are no changes at this time.  Avoidence of caffeine and alcohol  Moderate elevation of the head of the bed   4. Localized edema Recommend:  No surgery or intervention at this point in time.   The Patient is CEAP C4sEpAsPr.  The patient has been wearing compression for more than 12 weeks with no or little benefit.  The patient has been exercising daily for more than 12 weeks. The patient has been elevating and taking OTC pain medications for more than 12 weeks.  None of these have have eliminated the pain related to the lymphedema or the discomfort regarding excessive swelling and venous congestion.    I have reviewed my discussion with the patient regarding lymphedema and why it  causes symptoms.  Patient will continue wearing graduated compression on a daily basis. The patient should put the compression on first thing in the morning and removing them in the evening. The patient should not sleep in the compression.   In addition, behavioral modification throughout the day will be continued.  This will include frequent elevation  (such as in a recliner), use of over the counter pain medications as needed and exercise such as walking.  The systemic causes for chronic edema such as liver, kidney and cardiac etiologies do not appear to have significant changed over the past year.    The patient has chronic , severe lymphedema with hyperpigmentation of the skin and has done MLD, skin care, medication, diet, exercise, elevation and compression for 4 weeks with no improvement,  I am recommending a lymphedema pump.  The patient still has stage 3 lymphedema and therefore, I believe that a lymph pump is needed to improve the control of the patient's lymphedema and improve the quality of life.  Additionally, a lymph pump is warranted because it will reduce the risk of cellulitis and ulceration in the future.  Patient should follow-up in six months  - VAS [place] LOWER EXTREMITY VENOUS REFLUX    Levora Dredge, MD  02/19/2023 9:10 AM

## 2023-02-20 ENCOUNTER — Ambulatory Visit (INDEPENDENT_AMBULATORY_CARE_PROVIDER_SITE_OTHER): Payer: Medicaid Other

## 2023-02-20 ENCOUNTER — Ambulatory Visit (INDEPENDENT_AMBULATORY_CARE_PROVIDER_SITE_OTHER): Payer: Medicaid Other | Admitting: Vascular Surgery

## 2023-02-20 ENCOUNTER — Encounter (INDEPENDENT_AMBULATORY_CARE_PROVIDER_SITE_OTHER): Payer: Self-pay | Admitting: Vascular Surgery

## 2023-02-20 VITALS — BP 99/60 | HR 80 | Resp 18 | Ht 64.0 in | Wt 200.2 lb

## 2023-02-20 DIAGNOSIS — R600 Localized edema: Secondary | ICD-10-CM

## 2023-02-20 DIAGNOSIS — K219 Gastro-esophageal reflux disease without esophagitis: Secondary | ICD-10-CM | POA: Diagnosis not present

## 2023-02-20 DIAGNOSIS — I890 Lymphedema, not elsewhere classified: Secondary | ICD-10-CM

## 2023-02-20 DIAGNOSIS — I8310 Varicose veins of unspecified lower extremity with inflammation: Secondary | ICD-10-CM

## 2023-02-20 DIAGNOSIS — I872 Venous insufficiency (chronic) (peripheral): Secondary | ICD-10-CM

## 2023-02-21 ENCOUNTER — Encounter (INDEPENDENT_AMBULATORY_CARE_PROVIDER_SITE_OTHER): Payer: Self-pay | Admitting: Vascular Surgery

## 2023-02-21 DIAGNOSIS — I890 Lymphedema, not elsewhere classified: Secondary | ICD-10-CM | POA: Insufficient documentation

## 2023-02-21 DIAGNOSIS — I872 Venous insufficiency (chronic) (peripheral): Secondary | ICD-10-CM | POA: Insufficient documentation

## 2023-04-15 ENCOUNTER — Other Ambulatory Visit: Payer: Self-pay

## 2023-04-15 ENCOUNTER — Emergency Department
Admission: EM | Admit: 2023-04-15 | Discharge: 2023-04-15 | Discharge status: Against Medical Advice | Payer: Medicaid Other | Attending: Emergency Medicine | Admitting: Emergency Medicine

## 2023-04-15 ENCOUNTER — Emergency Department: Payer: Medicaid Other

## 2023-04-15 DIAGNOSIS — Z5321 Procedure and treatment not carried out due to patient leaving prior to being seen by health care provider: Secondary | ICD-10-CM | POA: Insufficient documentation

## 2023-04-15 DIAGNOSIS — R519 Headache, unspecified: Secondary | ICD-10-CM | POA: Insufficient documentation

## 2023-04-15 DIAGNOSIS — R040 Epistaxis: Secondary | ICD-10-CM | POA: Diagnosis not present

## 2023-04-15 NOTE — ED Triage Notes (Signed)
Pt to ED via POV c/o headache. Pt reports have brain tumors, goes to [facility] for it. Pt has been having headaches for 3 weeks now and they have been getting worse. Pt also started having nose bleeds with headaches last night. Denies CP, SOB, fevers, dizziness

## 2023-06-18 ENCOUNTER — Encounter: Payer: Self-pay | Admitting: Occupational Therapy

## 2023-06-18 ENCOUNTER — Ambulatory Visit: Payer: Medicaid Other | Attending: Vascular Surgery | Admitting: Occupational Therapy

## 2023-06-18 DIAGNOSIS — I890 Lymphedema, not elsewhere classified: Secondary | ICD-10-CM | POA: Insufficient documentation

## 2023-06-18 DIAGNOSIS — I872 Venous insufficiency (chronic) (peripheral): Secondary | ICD-10-CM | POA: Diagnosis not present

## 2023-06-18 NOTE — Therapy (Addendum)
OUTPATIENT OCCUPATIONAL THERAPY EVALUATION  LOWER EXTREMITY LYMPHEDEMA   Patient Name: PERCY COMP MRN: [id number] DOB:08-04-1986, 37 y.o., female Today's Date: 06/19/2023  END OF SESSION:   OT End of Session - 06/19/23 1449     Visit Number 1    Number of Visits 36    Date for OT Re-Evaluation 09/17/23    OT Start Time 0200    OT Stop Time 0305    OT Time Calculation (min) 65 min    Activity Tolerance Patient tolerated treatment well;No increased pain    Behavior During Therapy WFL for tasks assessed/performed              Past Medical History:  Diagnosis Date   Anxiety    Brain cancer (HCC)    Dyspnea    Endometriosis    Family history of ovarian cancer    Gastroesophageal reflux disease 09/20/2008   no problems since gallbladder taken out!! 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HCC)    Neuromuscular disorder (HCC)    Spontaneous abortion    Unspecified blood type, rh negative    Past Surgical History:  Procedure Laterality Date   BRAIN TUMOR EXCISION     CHOLECYSTECTOMY  2007   CYSTOSCOPY N/A 09/01/2018   Procedure: CYSTOSCOPY;  Surgeon: Conard Novak, MD;  Location: [facility];  Service: Gynecology;  Laterality: N/A;   DIAGNOSTIC LAPAROSCOPY  04/26/2010   IUD EXPULSION/PELVIC PAIN   DILATION AND CURETTAGE OF UTERUS  08/10/10; 08/06/07   HYDATIIDIFORM MOLE/POC, POC   DIRECT LARYNGOSCOPY N/A 10/10/2020   Procedure: INJECTION LARYNGOPLASTY C VOICE GEL, LEFT ONE VOCAL FOLD;  Surgeon: Bud Face, MD;  Location: [facility];  Service: ENT;  Laterality: N/A;   gallstones  08/05/2006   HYSTEROSCOPY  04/26/2010   IUD EXPULSION /PELVIC PAIN   LAPAROSCOPIC HYSTERECTOMY N/A 09/01/2018   Procedure: HYSTERECTOMY TOTAL LAPAROSCOPIC BILATERAL SALPINGECTOMY;  Surgeon: Conard Novak, MD;  Location:  [facility];  Service: Gynecology;  Laterality: N/A;   LAPAROSCOPY N/A 08/21/2017   Procedure: LAPAROSCOPY DIAGNOSTIC;  Surgeon: Conard Novak, MD;  Location: [facility];  Service: Gynecology;  Laterality: N/A;   LAPAROSCOPY N/A 07/30/2018   Procedure: LAPAROSCOPY DIAGNOSTIC WITH BILATERAL SALPINGECTOMY;  Surgeon: Conard Novak, MD;  Location: [facility];  Service: Gynecology;  Laterality: N/A;   SPINAL FUSION  2021   TUBAL LIGATION  01/2012   SDJ   Patient Active Problem List   Diagnosis Date Noted   Lymphedema 02/21/2023   Chronic venous insufficiency 02/21/2023   Varicose veins with inflammation 02/19/2023   Anxiety 02/09/2023   Abnormal weight gain 02/09/2023   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   Dyspnea 02/08/2023   Status post laparoscopic hysterectomy 09/01/2018   Urinary tract infection 08/21/2017   Endometriosis 07/02/2017   Pelvic pain 06/23/2017   Dyspareunia, female 06/23/2017   Menorrhagia with irregular cycle 06/23/2017   Genital herpes simplex 01/02/2015   Neck sprain 04/02/2012   Acne vulgaris 06/14/2009   Headache 06/14/2009   Gastroesophageal reflux disease 09/20/2008    PCP: [facility]- PCP General  REFERRING PROVIDER: Levora Dredge, MD  REFERRING DIAG: I89.0  THERAPY DIAG:  Lymphedema, not elsewhere classified  Rationale for Evaluation and Treatment: Rehabilitation  ONSET DATE: Feb-March 2024 onset of leg swelling and hands swelling. No  known precipitating event. Hospitalized in March for diuresis  SUBJECTIVE:                                                                                                                                                                                           SUBJECTIVE STATEMENT:  PERTINENT HISTORY:  No stockings, just got pump  PAIN:  Are you having pain? Yes: NPRS scale: 8/10 Pain location: hands and feet Pain description: chronic throbbing pain, heavy, tight Aggravating factors: being on  my feet, sitting too long, have to keep moving or it gets tighter and tighter Relieving factors: elevation, moving around, swimming  PRECAUTIONS: Other: lymphedema   WEIGHT BEARING RESTRICTIONS: No  FALLS:  Has patient fallen in last 6 months? No  LIVING ENVIRONMENT: Lives with: lives with their family and lives with their daughter Lives in: Mobile home Stairs: Yes; External: 4 steps; on right going up Has following equipment at home: None  OCCUPATION: not employed  LEISURE: family, children  HAND DOMINANCE: right   PRIOR LEVEL OF FUNCTION: Independent  PATIENT GOALS: get leg swelling down   OBJECTIVE:  COGNITION:  Overall cognitive status: Within functional limits for tasks assessed   FUNCTIONAL AMBULATION/ MOBILITY:  decreased standing, walking and dependent sitting tolerance due to leg swelling. needs seated rest breaks and extra time  POSTURE: WNL  LE ROM: WNL  LE MMT: WNL  BASIC/INSTRUMENTAL ADLS: difficulty fitting street shoes and socks, difficulty completing ADLs requiring ongoing  dependency for more than 15 minutes w/out seated rest break due to leg swelling, including shopping, housework, driving  PRODUCTIVE ACTIVITIES: unable to work due to limb swelling  LEISURE PURSUITS: no limits  SOCIAL PARTICIPATION: limited by leg swelling when > 15 minutes off dependent sitting, standing and/ or walking are required due to increased leg pain and swelling.  PSYCHOSOCIAL IMPACT: covers swollen legs with clothing due to embarrassment.  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  Skin  Description Hyper-Keratosis Peau' de Orange Shiny Tight Fibrotic/ Indurated Fatty Doughy Spongy/ boggy          x   Skin dry Flaky WNL Macerated     x    Color Redness Varicosities Blanching Hemosiderin Stain Mottled           Odor Malodorous Yeast Fungal infection   WNL      x   Temperature Warm Cool wnl     x    Pitting Edema   1+ 2+ 3+ 4+ Non-pitting  x   Girth Symmetrical Asymmetrical                   Distribution   x  Toes to popliteal    Stemmer Sign Positive Negative   +    Lymphorrhea History Of:  Present Absent     x    Wounds History Of Present Absent Venous Arterial Pressure Sheer     x        Signs of Infection Redness Warmth Erythema Acute Swelling Drainage Borders                    Sensation Light Touch Deep pressure Hypersensitivity   In tact Impaired In tact Impaired Absent Impaired   x  x  x     Nails WNL   Fungus nail dystrophy   x     Hair Growth Symmetrical Asymmetrical   x    Skin Creases Base of toes  Ankles   Base of Fingers knees       Abdominal pannus Thigh Lobules  Face/neck               BLE COMPARATIVE LIMB VOLUMETRICS TBA at initial OT visit  LANDMARK RIGHT    R LEG (A-D) N/A  R THIGH (E-G) ml  R FULL LIMB (A-G) ml  Limb Volume differential (LVD)  %  Volume change since initial %  Volume change overall V  (Blank rows = not tested)  LANDMARK LEFT    R LEG (A-D) N/A  R THIGH (E-G) ml  R FULL LIMB (A-G) ml  Limb Volume differential (LVD)  %  Volume change since initial %  Volume change overall %  (Blank rows = not tested)   TODAY'S TREATMENT:    OT lymphedema evaluation Pt edu                                                                                                                                       PATIENT EDUCATION:  Education details: Eval edu Provided basic level education regarding lymphatic structure and function, etiology, onset patterns, stages of progression, and prevention to limit infection risk, worsening condition and further functional decline. Pt edu for aught interaction between blood circulatory system and lymphatic circulation.Discussed  impact of gravity and co-morbidities on lymphatic function. Outlined Complete Decongestive Therapy  (CDT)  as standard of care and provided in depth information regarding 4 primary components of Intensive and Self Management Phases, including Manual Lymph Drainage (MLD), compression wrapping and garments, skin care, and therapeutic exercise. Homero Fellers discussion with re need for frequent attendance and high burden of care when caregiver is needed, impact of co morbidities. We discussed  the chronic, progressive nature of lymphedema and Importance of daily, ongoing LE self-care essential for limiting progression and infection risk.   Person educated: Patient Education method: Explanation, Demonstration, and Handouts Education comprehension: verbalized understanding, returned demonstration,  and needs further education  HOME EXERCISE PROGRAM: BLE lymphatic pumping there ex- 1 set of 10 each element, in order. Hold 5 2. Daily compression- thigh length multilayer compression bandages during Intensive Phase CDT; During self-Management Phase appropriate thigh high compression stockings paired with compression biker shorts (off the shelf or medical grade TBD) 3. Daily skin care with low ph lotion matching skin ph 4. Daily simple self MLD     ASSESSMENT:   CLINICAL IMPRESSION:Mahala Meda presents with very mild, stage I, BLE lymphedema extending from toes to popliteal fossa bilaterally. 2/2 CVI and varicose veins. Pt presents with positive Stemmer sign, associated pain, but swelling fluctuates daily, reduces or resolves over night with elevation, and lymphedema -related skin changes are absent. In addition to lymphatic congestion in the legs, Pt also complains of global swelling, including hands and face, which suggests an additional underlying systemic etiology. Chronic leg swelling and associated pain limits Pt's daily activities in all occupational domains, including functional ambulation and mobility, basic and instrumental ADLs, productive activities and leisure pursuits, social participation, role  performance,  and also contributes to decreased body image in the psychosocial domain. Pt will benefit from skilled Occupational Therapy for modified Intensive Phase Complete Decongestive Therapy (CDT) to reduce and control leg swelling and associated pain and to limit progression, CDT 1-2 x weekly will include compression wrapping, skin care, manual lymphatic drainage, therapeutic exercise and behavior modification for elevation. Pt will also receive LE self-care training for LE self-management, and support going forward PRN. Lymphedema is a chronic, progressive condition with no cure. With skilled OT intervention symptoms will reduce apt will become independent with self-management. Without skilled OT for CDT the condition will progress and further functional decline is expected.   OBJECTIVE IMPAIRMENTS: decreased knowledge of condition, decreased knowledge of use of DME, decreased mobility, increased edema, impaired sensation, and pain.    ACTIVITY LIMITATIONS: decreased dependent sitting, standing and walking tolerance > 30 minutes results in increased leg swelling and pain, squatting, LB dressing   PARTICIPATION LIMITATIONS: meal prep, cleaning, laundry, shopping, community activity, yard work, and leisure pursuits and social activities requiring standing, walking and / or dependent sitting > 30 minutes.    PERSONAL FACTORS: Age, Behavior pattern, Past/current experiences   REHAB POTENTIAL: Good   EVALUATION COMPLEXITY: LOW     GOALS: Goals reviewed with patient? Yes   SHORT TERM GOALS: Target date: 4th OT Rx visit    Pt will demonstrate understanding of lymphedema precautions and prevention strategies with modified independence using a printed reference to identify at least 5 precautions and discussing how s/he may implement them into daily life to reduce risk of progression with modified assistance Baseline: max a Goal status: INITIAL   2.  Pt will be able to independently apply  multilayer, knee length, compression wraps using gradient techniques to decrease limb volume, to limit infection risk, and to limit lymphedema progression.   Baseline: Dependent Goal status: INITIAL     LONG TERM GOALS: Target date:  09/17/23 (12 WEEKS)     Given this patient's Intake score of 88.24 % on the Lymphedema Life Impact Scale (LLIS), patient will experience a reduction of at least 10% in her perceived level of functional impairment resulting from lymphedema to improve functional performance and quality of life (QOL).Baseline: tbd Baseline: max a Goal status: INITIAL   2.  Given this patient's Intake score of  57% on the functional outcomes FOTO tool, patient will experience an increase in function of 5 points  to improve basic and instrumental ADLs performance, including lymphedema self-care. (TBA at first OT Rx visit) Baseline: max a Goal status: INITIAL   3.  With modified independence (extra time and assistive devices) Pt will be able to don and doff appropriate compression garments and/or devices to control BLE lymphedema and to limit progression.  Baseline: Dependent Goal status: INITIAL   4. Pt will achieve at least a 5% volume reductions bilaterally below the knees to return limb to more typical size and shape, to limit infection risk and LE progression, to decrease pain, to improve function, and to improve body image and QOL. Baseline: Dependent Goal status: INITIAL   5. Pt will achieve and sustain at least 85% attendance at OT sessions, and with compliance with all LE self-care home program components throughout CDT, including modified simple self-MLD, daily skin care and inspection, lymphatic pumping the ex and appropriate compression to limit lymphedema progression and to limit further functional decline. Baseline: Dependent Goal status: INITIAL       PLAN:   PT FREQUENCY: 1-2 x/week   PT DURATION: 12 weeks and PRN   PLANNED INTERVENTIONS: Therapeutic  exercises, Therapeutic activity, Patient/Family education, Self Care, DME instructions, Manual lymph drainage, Compression bandaging, Taping, and Manual therapy   PLAN FOR NEXT SESSION:  INITIAL VOLUMETRICS PT LE SELF CARE EDU KNEE LENGTH COMPRESSION WRAPS TO ONE LEG     Loel Dubonnet, MS, OTR/L, CLT-LANA 06/20/23 8:10 AM

## 2023-06-19 ENCOUNTER — Encounter: Payer: Self-pay | Admitting: Occupational Therapy

## 2023-06-20 ENCOUNTER — Ambulatory Visit: Payer: Medicaid Other | Admitting: Occupational Therapy

## 2023-06-20 ENCOUNTER — Ambulatory Visit: Payer: Medicaid Other | Attending: Vascular Surgery | Admitting: Occupational Therapy

## 2023-06-20 DIAGNOSIS — I890 Lymphedema, not elsewhere classified: Secondary | ICD-10-CM | POA: Insufficient documentation

## 2023-06-20 NOTE — Addendum Note (Signed)
Addended by: Judithann Sauger on: 06/20/2023 08:12 AM   Modules accepted: Orders

## 2023-06-25 ENCOUNTER — Ambulatory Visit: Payer: Medicaid Other | Admitting: Occupational Therapy

## 2023-06-25 ENCOUNTER — Encounter: Payer: Self-pay | Admitting: Occupational Therapy

## 2023-06-25 DIAGNOSIS — I890 Lymphedema, not elsewhere classified: Secondary | ICD-10-CM

## 2023-06-25 NOTE — Therapy (Signed)
OUTPATIENT OCCUPATIONAL THERAPY TREATMENT NOTE   LOWER EXTREMITY LYMPHEDEMA   Patient Name: Denise Bryan MRN: [id number] DOB:08/19/1986, 37 y.o., female Today's Date: 06/25/2023  END OF SESSION:   OT End of Session - 06/25/23 1118     Visit Number 2   Number of Visits 36    Date for OT Re-Evaluation 09/17/23    OT Start Time 1110    OT Stop Time 1209    OT Time Calculation (min) 59 min    Activity Tolerance Patient tolerated treatment well;No increased pain    Behavior During Therapy WFL for tasks assessed/performed              Past Medical History:  Diagnosis Date   Anxiety    Brain cancer (HCC)    Dyspnea    Endometriosis    Family history of ovarian cancer    Gastroesophageal reflux disease 09/20/2008   no problems since gallbladder taken out!! 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HCC)    Neuromuscular disorder (HCC)    Spontaneous abortion    Unspecified blood type, rh negative    Past Surgical History:  Procedure Laterality Date   BRAIN TUMOR EXCISION     CHOLECYSTECTOMY  2007   CYSTOSCOPY N/A 09/01/2018   Procedure: CYSTOSCOPY;  Surgeon: Conard Novak, MD;  Location: [facility];  Service: Gynecology;  Laterality: N/A;   DIAGNOSTIC LAPAROSCOPY  04/26/2010   IUD EXPULSION/PELVIC PAIN   DILATION AND CURETTAGE OF UTERUS  08/10/10; 08/06/07   HYDATIIDIFORM MOLE/POC, POC   DIRECT LARYNGOSCOPY N/A 10/10/2020   Procedure: INJECTION LARYNGOPLASTY C VOICE GEL, LEFT ONE VOCAL FOLD;  Surgeon: Bud Face, MD;  Location: [facility];  Service: ENT;  Laterality: N/A;   gallstones  08/05/2006   HYSTEROSCOPY  04/26/2010   IUD EXPULSION /PELVIC PAIN   LAPAROSCOPIC HYSTERECTOMY N/A 09/01/2018   Procedure: HYSTERECTOMY TOTAL LAPAROSCOPIC BILATERAL SALPINGECTOMY;  Surgeon: Conard Novak, MD;   Location: [facility];  Service: Gynecology;  Laterality: N/A;   LAPAROSCOPY N/A 08/21/2017   Procedure: LAPAROSCOPY DIAGNOSTIC;  Surgeon: Conard Novak, MD;  Location: [facility];  Service: Gynecology;  Laterality: N/A;   LAPAROSCOPY N/A 07/30/2018   Procedure: LAPAROSCOPY DIAGNOSTIC WITH BILATERAL SALPINGECTOMY;  Surgeon: Conard Novak, MD;  Location: [facility];  Service: Gynecology;  Laterality: N/A;   SPINAL FUSION  2021   TUBAL LIGATION  01/2012   SDJ   Patient Active Problem List   Diagnosis Date Noted   Lymphedema 02/21/2023   Chronic venous insufficiency 02/21/2023   Varicose veins with inflammation 02/19/2023   Anxiety 02/09/2023   Abnormal weight gain 02/09/2023   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   Dyspnea 02/08/2023   Status post laparoscopic hysterectomy 09/01/2018   Urinary tract infection 08/21/2017   Endometriosis 07/02/2017   Pelvic pain 06/23/2017   Dyspareunia, female 06/23/2017   Menorrhagia with irregular cycle 06/23/2017   Genital herpes simplex 01/02/2015   Neck sprain 04/02/2012   Acne vulgaris 06/14/2009   Headache 06/14/2009   Gastroesophageal reflux disease 09/20/2008    PCP: [facility]- PCP General  REFERRING PROVIDER: Levora Dredge, MD  REFERRING DIAG: I89.0  THERAPY DIAG:  Lymphedema, not elsewhere classified  Rationale for Evaluation and Treatment: Rehabilitation  ONSET DATE: Feb-March 2024 onset of leg swelling and hands swelling.  No known precipitating event. Hospitalized in March for diuresis  SUBJECTIVE:                                                                                                                                                                                           SUBJECTIVE STATEMENT: Pt arrives 1 hour late for scheduled appointment at 10 AM. We were able to work her in to an open 11 AM slot. Pt reports an [facility] security guard would not let her enter the hospital while wearing a nursing uniform  from [facility]. Pt stated she lives 45 minutes away and had to drive all the way home to change and return. On visit 1 for evaluation Pt stated she is not employed. Today, visit 2, she reports she works as a Engineer, civil (consulting) in labor and delivery at [employer] and at a [employer] in [place]. Pt reports 8/10 pain in her feet and legs on average, R>L.  PERTINENT HISTORY:  No stockings, just got pump  PAIN:  Are you having pain? Yes: NPRS scale: 8/10 Pain location: hands and feet Pain description: tight Aggravating factors: being on my feet, sitting too long, have to keep moving or it gets tighter and tighter Relieving factors: elevation, moving around, swimming  PRECAUTIONS: Other: lymphedema  FALLS:  Has patient fallen in last 6 months? No  OCCUPATION: "not employed"  LEISURE: family, children  HAND DOMINANCE: right   PRIOR LEVEL OF FUNCTION: Independent  PATIENT GOALS: get leg swelling down   OBJECTIVE:  COGNITION:  Overall cognitive status: Within functional limits for tasks assessed   BASIC/INSTRUMENTAL ADLS: difficulty fitting street shoes and socks, difficulty completing ADLs requiring ongoing  dependency for more than 15 minutes w/out seated rest break due to leg swelling, including shopping, housework, driving  PRODUCTIVE ACTIVITIES: unable to work due to limb swelling  LEISURE PURSUITS: no limits  SOCIAL PARTICIPATION: limited by leg swelling when > 15 minutes off dependent sitting, standing and/ or walking are required due to increased leg pain and swelling.  FOTO functional scale: 57%  LYMPHEDEMA LIFE IMPACT SCALE (LLIS): 88.24%   BLE COMPARATIVE LIMB VOLUMETRICS Initial 06/25/23  LANDMARK RIGHT    R LEG (A-D) 2971.5 ml  R THIGH (E-G) ml  R FULL LIMB (A-G) ml  Limb Volume differential (LVD)  LEG limb volume differential (LVD) = 20.9%, R>L  Volume change since initial %  Volume change overall V  (Blank rows = not tested)  LANDMARK LEFT    R LEG (A-D) 2350.7  ml  R THIGH (E-G) ml  R FULL LIMB (A-G) ml  Limb Volume differential (LVD)  %  Volume change since initial %  Volume change overall %  (Blank rows = not tested)   TODAY'S TREATMENT:    BLE leg comparative limb volumetrics Pt edu                                                                                                                                       PATIENT EDUCATION:  Education details: Eval edu Provided basic level education regarding lymphatic structure and function, etiology, onset patterns, stages of progression, and prevention to limit infection risk, worsening condition and further functional decline. Pt edu for aught interaction between blood circulatory system and lymphatic circulation.Discussed  impact of gravity and co-morbidities on lymphatic function. Outlined Complete Decongestive Therapy (CDT)  as standard of care and provided in depth information regarding 4 primary components of Intensive and Self Management Phases, including Manual Lymph Drainage (MLD), compression wrapping and garments, skin care, and therapeutic exercise. Homero Fellers discussion with re need for frequent attendance and high burden of care when caregiver is needed, impact of co morbidities. We discussed  the chronic, progressive nature of lymphedema and Importance of daily, ongoing LE self-care essential for limiting progression and infection risk.   Person educated: Patient Education method: Explanation, Demonstration, and Handouts Education comprehension: verbalized understanding, returned demonstration, and needs further education  HOME EXERCISE PROGRAM: BLE lymphatic pumping there ex- 1 set of 10 each element, in order. Hold 5 2. Daily compression- thigh length multilayer compression bandages during Intensive Phase CDT; During self-Management Phase appropriate thigh high compression stockings paired with compression biker shorts (off the shelf or medical grade TBD) 3. Daily skin care with low ph  lotion matching skin ph 4. Daily simple self MLD     ASSESSMENT:   CLINICAL IMPRESSION:BLE comparative limb volumetrics reveal LVD for legs measure 20.9%, R>L. Typically the dominant leg is 2-3% larger than the non-dominant limb, so a 20 % difference is significantly larger. Applied 3 layer, gradient compression wraps from base of toes to popliteal fossa. Pt was able to get her Crocs  shoe over bandage, but stated she might have to take it off because she has to wear a dress to work and they wont let her wear the bandage. OT encouraged Pt to leave wraps on for 24 hours if she's able, otherwise remove it. We discussed at length the option of modifying protocol and skipping ahead to compression garments if that's what she want to do. Cont as per POC.    OBJECTIVE IMPAIRMENTS: decreased knowledge of condition, decreased knowledge of use of DME, decreased mobility, increased edema, impaired sensation, and pain.    ACTIVITY LIMITATIONS: decreased dependent sitting, standing and walking tolerance > 30 minutes results in increased leg swelling and pain, squatting, LB dressing   PARTICIPATION LIMITATIONS: meal prep, cleaning, laundry, shopping, community activity, yard work, and leisure pursuits and social activities requiring standing, walking and / or dependent sitting > 30 minutes.  PERSONAL FACTORS: Age, Behavior pattern, Past/current experiences   REHAB POTENTIAL: Good   EVALUATION COMPLEXITY: LOW     GOALS: Goals reviewed with patient? Yes   SHORT TERM GOALS: Target date: 4th OT Rx visit    Pt will demonstrate understanding of lymphedema precautions and prevention strategies with modified independence using a printed reference to identify at least 5 precautions and discussing how s/he may implement them into daily life to reduce risk of progression with modified assistance Baseline: max a Goal status: INITIAL   2.  Pt will be able to independently apply multilayer, knee length,  compression wraps using gradient techniques to decrease limb volume, to limit infection risk, and to limit lymphedema progression.   Baseline: Dependent Goal status: INITIAL     LONG TERM GOALS: Target date:  09/17/23 (12 WEEKS)     Given this patient's Intake score of 88.24 % on the Lymphedema Life Impact Scale (LLIS), patient will experience a reduction of at least 10% in her perceived level of functional impairment resulting from lymphedema to improve functional performance and quality of life (QOL).Baseline: tbd Baseline: max a Goal status: INITIAL   2.  Given this patient's Intake score of  57% on the functional outcomes FOTO tool, patient will experience an increase in function of 5 points to improve basic and instrumental ADLs performance, including lymphedema self-care. (TBA at first OT Rx visit) Baseline: max a Goal status: INITIAL   3.  With modified independence (extra time and assistive devices) Pt will be able to don and doff appropriate compression garments and/or devices to control BLE lymphedema and to limit progression.  Baseline: Dependent Goal status: INITIAL   4. Pt will achieve at least a 5% volume reductions bilaterally below the knees to return limb to more typical size and shape, to limit infection risk and LE progression, to decrease pain, to improve function, and to improve body image and QOL. Baseline: Dependent Goal status: INITIAL   5. Pt will achieve and sustain at least 85% attendance at OT sessions, and with compliance with all LE self-care home program components throughout CDT, including modified simple self-MLD, daily skin care and inspection, lymphatic pumping the ex and appropriate compression to limit lymphedema progression and to limit further functional decline. Baseline: Dependent Goal status: INITIAL       PLAN:   PT FREQUENCY: 1-2 x/week   PT DURATION: 12 weeks and PRN   PLANNED INTERVENTIONS: Therapeutic exercises, Therapeutic activity,  Patient/Family education, Self Care, DME instructions, Manual lymph drainage, Compression bandaging, Taping, and Manual therapy   PLAN FOR NEXT SESSION:  PT LE SELF CARE EDU for self wrapping KNEE LENGTH COMPRESSION WRAPS TO ONE LEG     Loel Dubonnet, MS, OTR/L, CLT-LANA 06/25/23 12:13 PM

## 2023-07-03 ENCOUNTER — Ambulatory Visit: Payer: Medicaid Other | Admitting: Occupational Therapy

## 2023-07-04 ENCOUNTER — Encounter: Payer: Medicaid Other | Admitting: Occupational Therapy

## 2023-07-07 ENCOUNTER — Ambulatory Visit: Payer: Medicaid Other | Admitting: Occupational Therapy

## 2023-07-08 ENCOUNTER — Encounter: Payer: Medicaid Other | Admitting: Occupational Therapy

## 2023-07-11 ENCOUNTER — Encounter: Payer: Medicaid Other | Admitting: Occupational Therapy

## 2023-07-14 ENCOUNTER — Encounter: Payer: Medicaid Other | Admitting: Occupational Therapy

## 2023-07-17 ENCOUNTER — Encounter: Payer: Medicaid Other | Admitting: Occupational Therapy

## 2023-07-18 ENCOUNTER — Encounter: Payer: Medicaid Other | Admitting: Occupational Therapy

## 2023-07-23 ENCOUNTER — Encounter: Payer: Medicaid Other | Admitting: Occupational Therapy

## 2023-07-24 ENCOUNTER — Other Ambulatory Visit: Payer: Self-pay

## 2023-07-24 ENCOUNTER — Emergency Department
Admission: EM | Admit: 2023-07-24 | Discharge: 2023-07-24 | Disposition: A | Discharge status: Home / Self Care | Payer: Medicaid Other | Attending: Emergency Medicine | Admitting: Emergency Medicine

## 2023-07-24 DIAGNOSIS — B029 Zoster without complications: Secondary | ICD-10-CM | POA: Diagnosis not present

## 2023-07-24 DIAGNOSIS — R21 Rash and other nonspecific skin eruption: Secondary | ICD-10-CM | POA: Diagnosis present

## 2023-07-24 MED ORDER — OXYCODONE HCL 5 MG PO TABS: 5.0000 mg | ORAL_TABLET | Freq: Four times a day (QID) | ORAL | 0 refills | Status: AC | PRN

## 2023-07-24 MED ORDER — HYDROCODONE-ACETAMINOPHEN 5-325 MG PO TABS
1.0000 | ORAL_TABLET | ORAL | Status: DC
  Filled 2023-07-24: qty 1

## 2023-07-24 MED ORDER — OXYCODONE HCL 5 MG PO TABS
5.0000 mg | ORAL_TABLET | Freq: Once | ORAL | Status: AC
  Administered 2023-07-24: 5 mg via ORAL
  Filled 2023-07-24: qty 1

## 2023-07-24 NOTE — ED Provider Notes (Signed)
[facility] AT [facility] Provider Note   CSN: [id number] Arrival date & time: 07/24/23  2037     History  Chief Complaint  Patient presents with   Rash    Denise Bryan is a 37 y.o. female.  Presents to the emergency department valuation of shingles.  Patient states yesterday she developed a rash along the right forehead and upper orbital rim of the right thigh region.  She has had red clustered blistering with sharp stabbing burning pain.  No eye redness, vision changes.  She states the pain is moderate.  She was prescribed valacyclovir and prednisone by PCP yesterday.  She denies any drainage or eye redness.  No vision changes.  No photophobia.  HPI     Home Medications Prior to Admission medications   Medication Sig Start Date End Date Taking? Authorizing Provider  oxyCODONE (ROXICODONE) 5 MG immediate release tablet Take 1 tablet (5 mg total) by mouth every 6 (six) hours as needed. 07/24/23 07/23/24 Yes Evon Slack, PA-C  hydrOXYzine (ATARAX) 10 MG tablet Take 1 tablet (10 mg total) by mouth 3 (three) times daily as needed for anxiety. Patient not taking: Reported on 02/20/2023 02/11/23   Kathlen Mody, MD  ibuprofen (ADVIL) 800 MG tablet Take by mouth.    [provider]  ketoconazole (NIZORAL) 2 % shampoo Apply 1 Application topically 2 (two) times a week. 01/22/23   [provider]  torsemide (DEMADEX) 20 MG tablet Take 1 tablet (20 mg total) by mouth daily. 02/11/23 02/11/24  Kathlen Mody, MD      Allergies    Eicosapentaenoic acid (epa), Fish-derived products, Iodine, and Tramadol    Review of Systems   Review of Systems  Physical Exam Updated Vital Signs BP (!) 149/88   Pulse 98   Temp 98.4 F (36.9 C) (Oral)   Resp 18   Ht 5\' 4"  (1.626 m)   Wt 74.8 kg   LMP 08/18/2018 Comment: preg test negative  SpO2 98%   BMI 28.32 kg/m  Physical Exam Constitutional:      Appearance: She is well-developed.  HENT:      Head: Normocephalic and atraumatic.  Eyes:     Extraocular Movements: Extraocular movements intact.     Conjunctiva/sclera: Conjunctivae normal.     Pupils: Pupils are equal, round, and reactive to light.     Comments: No right eye conjunctival erythema.  Pupils equal round reactive to light.  No photosensitivity.  Just above the right eyelid there is some erythematous clustering papules extending into the orbital rim of the right side.  No rash extending into the nose or inferior orbital rim region  Cardiovascular:     Rate and Rhythm: Normal rate.  Pulmonary:     Effort: Pulmonary effort is normal. No respiratory distress.  Musculoskeletal:        General: Normal range of motion.     Cervical back: Normal range of motion.  Skin:    General: Skin is warm.     Findings: No rash.  Neurological:     Mental Status: She is alert and oriented to person, place, and time.  Psychiatric:        Behavior: Behavior normal.        Thought Content: Thought content normal.     ED Results / Procedures / Treatments   Labs (all labs ordered are listed, but only abnormal results are displayed) Labs Reviewed - No data to display  EKG None  Radiology No results found.  Procedures Procedures    Medications Ordered in ED Medications  oxyCODONE (Oxy IR/ROXICODONE) immediate release tablet 5 mg (has no administration in time range)    ED Course/ Medical Decision Making/ A&P                                 Medical Decision Making Risk Prescription drug management.   37 year old female with shingles, right V1 distribution of the trigeminal nerve.  No nasal ciliary branch distribution of the rash.  No ocular involvement at this time.  She will continue with prednisone and valacyclovir will give oxycodone for severe breakthrough pain.  She is educated on signs and symptoms return to the ER for such as eye redness, vision changes, eye pain photophobia. Final Clinical Impression(s) / ED  Diagnoses Final diagnoses:  Herpes zoster without complication    Rx / DC Orders ED Discharge Orders          Ordered    oxyCODONE (ROXICODONE) 5 MG immediate release tablet  Every 6 hours PRN        07/24/23 2125              Ronnette Juniper 07/24/23 2129    Minna Antis, MD 07/27/23 1550

## 2023-07-24 NOTE — ED Triage Notes (Signed)
Pt states she developed rash around right eye and forehead yesterday, pt was seen at her PCP today and told she has shingles. Pt given medication to treat for shingles but states she does not think it is shingles. Pt has blistered rash going above right eye.

## 2023-07-24 NOTE — Discharge Instructions (Signed)
Please continue with prednisone and valacyclovir.  You may use topical anti-itch medication as needed.  Please take oxycodone as needed for moderate to severe pain.  Return to the ER for any vision changes, eye redness, discharge, worsening symptoms or any urgent changes in your health

## 2023-07-25 ENCOUNTER — Encounter: Payer: Medicaid Other | Admitting: Occupational Therapy

## 2023-07-28 ENCOUNTER — Encounter: Payer: Medicaid Other | Admitting: Occupational Therapy

## 2023-07-28 NOTE — Group Note (Deleted)

## 2023-07-29 ENCOUNTER — Encounter: Payer: Medicaid Other | Admitting: Occupational Therapy

## 2023-07-31 ENCOUNTER — Encounter: Payer: Medicaid Other | Admitting: Occupational Therapy

## 2023-08-04 ENCOUNTER — Encounter: Payer: Medicaid Other | Admitting: Occupational Therapy

## 2023-08-05 ENCOUNTER — Encounter: Payer: Medicaid Other | Admitting: Occupational Therapy

## 2023-08-07 ENCOUNTER — Encounter: Payer: Medicaid Other | Admitting: Occupational Therapy

## 2023-08-11 ENCOUNTER — Encounter: Payer: Medicaid Other | Admitting: Occupational Therapy

## 2023-08-13 ENCOUNTER — Encounter: Payer: Medicaid Other | Admitting: Occupational Therapy

## 2023-08-14 ENCOUNTER — Encounter: Payer: Medicaid Other | Admitting: Occupational Therapy

## 2023-08-18 ENCOUNTER — Encounter: Payer: Medicaid Other | Admitting: Occupational Therapy

## 2023-08-20 ENCOUNTER — Encounter: Payer: Medicaid Other | Admitting: Occupational Therapy

## 2023-08-21 ENCOUNTER — Encounter: Payer: Medicaid Other | Admitting: Occupational Therapy

## 2023-08-22 NOTE — Progress Notes (Deleted)
MRN : [id number]  Denise Bryan is a 37 y.o. (1986-01-06) female who presents with chief complaint of legs swell.  History of Present Illness:   ***  No outpatient medications have been marked as taking for the 08/25/23 encounter (Appointment) with Gilda Crease, Latina Craver, MD.    Past Medical History:  Diagnosis Date   Anxiety    Brain cancer [facility])    Dyspnea    Endometriosis    Family history of ovarian cancer    Gastroesophageal reflux disease 09/20/2008   no problems since gallbladder taken out!! 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HCC)    Neuromuscular disorder (HCC)    Spontaneous abortion    Unspecified blood type, rh negative     Past Surgical History:  Procedure Laterality Date   BRAIN TUMOR EXCISION     CHOLECYSTECTOMY  2007   CYSTOSCOPY N/A 09/01/2018   Procedure: CYSTOSCOPY;  Surgeon: Conard Novak, MD;  Location: [facility];  Service: Gynecology;  Laterality: N/A;   DIAGNOSTIC LAPAROSCOPY  04/26/2010   IUD EXPULSION/PELVIC PAIN   DILATION AND CURETTAGE OF UTERUS  08/10/10; 08/06/07   HYDATIIDIFORM MOLE/POC, POC   DIRECT LARYNGOSCOPY N/A 10/10/2020   Procedure: INJECTION LARYNGOPLASTY C VOICE GEL, LEFT ONE VOCAL FOLD;  Surgeon: Bud Face, MD;  Location: [facility];  Service: ENT;  Laterality: N/A;   gallstones  08/05/2006   HYSTEROSCOPY  04/26/2010   IUD EXPULSION /PELVIC PAIN   LAPAROSCOPIC HYSTERECTOMY N/A 09/01/2018   Procedure: HYSTERECTOMY TOTAL LAPAROSCOPIC BILATERAL SALPINGECTOMY;  Surgeon: Conard Novak, MD;  Location: [facility];  Service: Gynecology;  Laterality: N/A;   LAPAROSCOPY N/A 08/21/2017   Procedure: LAPAROSCOPY DIAGNOSTIC;  Surgeon: Conard Novak, MD;  Location: [facility];  Service: Gynecology;  Laterality: N/A;   LAPAROSCOPY N/A  07/30/2018   Procedure: LAPAROSCOPY DIAGNOSTIC WITH BILATERAL SALPINGECTOMY;  Surgeon: Conard Novak, MD;  Location: [facility];  Service: Gynecology;  Laterality: N/A;   SPINAL FUSION  2021   TUBAL LIGATION  01/2012   SDJ    Social History Social History   Tobacco Use   Smoking status: Never   Smokeless tobacco: Never  Vaping Use   Vaping status: Never Used  Substance Use Topics   Alcohol use: No   Drug use: No    Family History Family History  Problem Relation Age of Onset   Diabetes Mother    Hypertension Mother    Cancer Mother 67       UTERINE   Colon cancer Mother    Cancer Sister 20       CX   Diabetes Maternal Uncle    Diabetes Maternal Grandmother    Hypertension Maternal Grandmother    Lung disease Maternal Grandfather    Cancer Maternal Grandfather        LUNG   Heart disease Paternal Grandmother    Hypertension Paternal Grandmother     Allergies  Allergen Reactions   Eicosapentaenoic Acid (Epa) Anaphylaxis   Fish-Derived Products Anaphylaxis   Iodine Anaphylaxis   Tramadol Anaphylaxis and Hives     REVIEW OF SYSTEMS (Negative unless checked)  Constitutional: [] Weight loss  []   Fever  [] Chills Cardiac: [] Chest pain   [] Chest pressure   [] Palpitations   [] Shortness of breath when laying flat   [] Shortness of breath with exertion. Vascular:  [] Pain in legs with walking   [x] Pain in legs with standing  [] History of DVT   [] Phlebitis   [x] Swelling in legs   [] Varicose veins   [] Non-healing ulcers Pulmonary:   [] Uses home oxygen   [] Productive cough   [] Hemoptysis   [] Wheeze  [] COPD   [] Asthma Neurologic:  [] Dizziness   [] Seizures   [] History of stroke   [] History of TIA  [] Aphasia   [] Vissual changes   [] Weakness or numbness in arm   [] Weakness or numbness in leg Musculoskeletal:   [] Joint swelling   [] Joint pain   [] Low back pain Hematologic:  [] Easy bruising  [] Easy bleeding   [] Hypercoagulable state   [] Anemic Gastrointestinal:  [] Diarrhea    [] Vomiting  [x] Gastroesophageal reflux/heartburn   [] Difficulty swallowing. Genitourinary:  [] Chronic kidney disease   [] Difficult urination  [] Frequent urination   [] Blood in urine Skin:  [] Rashes   [] Ulcers  Psychological:  [x] History of anxiety   []  History of major depression.  Physical Examination  There were no vitals filed for this visit. There is no height or weight on file to calculate BMI. Gen: WD/WN, NAD Head: [place]/AT, No temporalis wasting.  Ear/Nose/Throat: Hearing grossly intact, nares w/o erythema or drainage, pinna without lesions Eyes: PER, EOMI, sclera nonicteric.  Neck: Supple, no gross masses.  No JVD.  Pulmonary:  Good air movement, no audible wheezing, no use of accessory muscles.  Cardiac: RRR, precordium not hyperdynamic. Vascular:  scattered varicosities present bilaterally.  Mild venous stasis changes to the legs bilaterally.  3-4+ soft pitting edema, CEAP [id number]  Vessel Right Left  Radial Palpable Palpable  Gastrointestinal: soft, non-distended. No guarding/no peritoneal signs.  Musculoskeletal: M/S 5/5 throughout.  No deformity.  Neurologic: CN 2-12 intact. Pain and light touch intact in extremities.  Symmetrical.  Speech is fluent. Motor exam as listed above. Psychiatric: Judgment intact, Mood & affect appropriate for pt's clinical situation. Dermatologic: Venous rashes no ulcers noted.  No changes consistent with cellulitis. Lymph : No lichenification or skin changes of chronic lymphedema.  CBC Lab Results  Component Value Date   WBC 5.1 02/09/2023   HGB 12.6 02/09/2023   HCT 35.8 (L) 02/09/2023   MCV 89.7 02/09/2023   PLT 252 02/09/2023    BMET    Component Value Date/Time   NA 138 02/10/2023 1049   NA 137 12/22/2012 1720   K 4.0 02/10/2023 1049   K 4.1 12/22/2012 1720   CL 100 02/10/2023 1049   CL 105 12/22/2012 1720   CO2 26 02/10/2023 1049   CO2 27 12/22/2012 1720   GLUCOSE 124 (H) 02/10/2023 1049   GLUCOSE 87 12/22/2012 1720   BUN  10 02/10/2023 1049   BUN 10 12/22/2012 1720   CREATININE 0.65 02/10/2023 1049   CREATININE 0.52 (L) 12/22/2012 1720   CALCIUM 9.6 02/10/2023 1049   CALCIUM 9.1 12/22/2012 1720   GFRNONAA >60 02/10/2023 1049   GFRNONAA >60 12/22/2012 1720   GFRAA >60 04/02/2019 1337   GFRAA >60 12/22/2012 1720   CrCl cannot be calculated (Patient's most recent lab result is older than the maximum 21 days allowed.).  COAG Lab Results  Component Value Date   INR 1.1 02/09/2023    Radiology No results found.   Assessment/Plan There are no diagnoses linked to this encounter.   Levora Dredge, MD  08/22/2023 4:37 PM

## 2023-08-25 ENCOUNTER — Ambulatory Visit (INDEPENDENT_AMBULATORY_CARE_PROVIDER_SITE_OTHER): Payer: Medicaid Other | Admitting: Vascular Surgery

## 2023-08-25 ENCOUNTER — Encounter (INDEPENDENT_AMBULATORY_CARE_PROVIDER_SITE_OTHER): Payer: Self-pay

## 2023-08-25 ENCOUNTER — Encounter: Payer: Medicaid Other | Admitting: Occupational Therapy

## 2023-08-25 DIAGNOSIS — I890 Lymphedema, not elsewhere classified: Secondary | ICD-10-CM

## 2023-08-25 DIAGNOSIS — K219 Gastro-esophageal reflux disease without esophagitis: Secondary | ICD-10-CM

## 2023-08-25 DIAGNOSIS — I872 Venous insufficiency (chronic) (peripheral): Secondary | ICD-10-CM

## 2023-08-27 ENCOUNTER — Encounter: Payer: Medicaid Other | Admitting: Occupational Therapy

## 2023-08-28 ENCOUNTER — Encounter: Payer: Medicaid Other | Admitting: Occupational Therapy

## 2023-09-01 ENCOUNTER — Encounter: Payer: Medicaid Other | Admitting: Occupational Therapy

## 2023-09-03 ENCOUNTER — Encounter: Payer: Medicaid Other | Admitting: Occupational Therapy

## 2023-09-04 ENCOUNTER — Encounter: Payer: Medicaid Other | Admitting: Occupational Therapy

## 2023-09-08 ENCOUNTER — Encounter: Payer: Medicaid Other | Admitting: Occupational Therapy

## 2023-09-10 ENCOUNTER — Encounter: Payer: Medicaid Other | Admitting: Occupational Therapy

## 2023-09-11 ENCOUNTER — Encounter: Payer: Medicaid Other | Admitting: Occupational Therapy

## 2023-09-15 ENCOUNTER — Encounter: Payer: Medicaid Other | Admitting: Occupational Therapy

## 2023-09-17 ENCOUNTER — Encounter: Payer: Medicaid Other | Admitting: Occupational Therapy

## 2023-09-18 ENCOUNTER — Encounter: Payer: Medicaid Other | Admitting: Occupational Therapy

## 2023-09-22 ENCOUNTER — Encounter: Payer: Medicaid Other | Admitting: Occupational Therapy

## 2023-09-29 ENCOUNTER — Encounter: Payer: Medicaid Other | Admitting: Occupational Therapy

## 2023-10-06 ENCOUNTER — Encounter: Payer: Medicaid Other | Admitting: Occupational Therapy

## 2023-10-13 ENCOUNTER — Encounter: Payer: Medicaid Other | Admitting: Occupational Therapy

## 2023-10-20 ENCOUNTER — Encounter: Payer: Medicaid Other | Admitting: Occupational Therapy

## 2023-10-24 ENCOUNTER — Encounter (HOSPITAL_COMMUNITY): Payer: Self-pay

## 2023-10-24 ENCOUNTER — Emergency Department (HOSPITAL_COMMUNITY)
Admission: EM | Admit: 2023-10-24 | Discharge: 2023-10-25 | Disposition: A | Discharge status: Home / Self Care | Payer: Medicaid Other | Attending: Emergency Medicine | Admitting: Emergency Medicine

## 2023-10-24 ENCOUNTER — Other Ambulatory Visit: Payer: Self-pay

## 2023-10-24 DIAGNOSIS — K0381 Cracked tooth: Secondary | ICD-10-CM | POA: Diagnosis present

## 2023-10-24 DIAGNOSIS — S0993XA Unspecified injury of face, initial encounter: Secondary | ICD-10-CM

## 2023-10-24 NOTE — ED Triage Notes (Signed)
Pt was eating and bone was in food, pt cracked right bottom tooth. Requesting for tooth to be pulled. Denies fever, chills, body aches

## 2023-10-25 MED ORDER — NAPROXEN 500 MG PO TABS: 500.0000 mg | ORAL_TABLET | Freq: Two times a day (BID) | ORAL | 0 refills | Status: DC

## 2023-10-25 MED ORDER — NAPROXEN 500 MG PO TABS
500.0000 mg | ORAL_TABLET | Freq: Once | ORAL | Status: AC
  Administered 2023-10-25: 500 mg via ORAL
  Filled 2023-10-25: qty 1

## 2023-10-25 MED ORDER — PENICILLIN V POTASSIUM 500 MG PO TABS: 500.0000 mg | ORAL_TABLET | Freq: Four times a day (QID) | ORAL | 0 refills | Status: AC

## 2023-10-25 MED ORDER — LIDOCAINE VISCOUS HCL 2 % MT SOLN
15.0000 mL | Freq: Once | OROMUCOSAL | Status: AC
  Administered 2023-10-25: 15 mL via OROMUCOSAL
  Filled 2023-10-25: qty 15

## 2023-10-25 MED ORDER — PENICILLIN V POTASSIUM 500 MG PO TABS
500.0000 mg | ORAL_TABLET | Freq: Once | ORAL | Status: AC
  Administered 2023-10-25: 500 mg via ORAL
  Filled 2023-10-25: qty 1

## 2023-10-25 MED ORDER — LIDOCAINE VISCOUS HCL 2 % MT SOLN: 15.0000 mL | OROMUCOSAL | 0 refills | Status: DC | PRN

## 2023-10-25 NOTE — ED Notes (Addendum)
Called pharmacy for medication lidocaine 2% viscous per MAR. Not able to pull in pyxis

## 2023-10-25 NOTE — ED Notes (Signed)
 Patient d/c with home care instructions.

## 2023-10-25 NOTE — ED Provider Notes (Signed)
[place] EMERGENCY DEPARTMENT AT [facility] Provider Note   CSN: [id number] Arrival date & time: 10/24/23  2047     History  Chief Complaint  Patient presents with   Dental Injury    Denise Bryan is a 37 y.o. female.  The history is provided by the patient and medical records.  Dental Injury   37 year old female with hx of anxiety, GERD, endometriosis, presenting to the ED for broken tooth.  States she was eating [place] food tonight and bit into a piece of bone which cracked her tooth.  States teeth are already brittle due to prior radiation to her head/face area.  She did call her dentist, however cannot be seen until Thursday.  No fever/chills.  No meds PTA.  Home Medications Prior to Admission medications   Medication Sig Start Date End Date Taking? Authorizing Provider  lidocaine (XYLOCAINE) 2 % solution Use as directed 15 mLs in the mouth or throat as needed for mouth pain. 10/25/23  Yes Garlon Hatchet, PA-C  naproxen (NAPROSYN) 500 MG tablet Take 1 tablet (500 mg total) by mouth 2 (two) times daily. 10/25/23  Yes Garlon Hatchet, PA-C  penicillin v potassium (VEETID) 500 MG tablet Take 1 tablet (500 mg total) by mouth 4 (four) times daily for 10 days. 10/25/23 11/04/23 Yes Garlon Hatchet, PA-C  hydrOXYzine (ATARAX) 10 MG tablet Take 1 tablet (10 mg total) by mouth 3 (three) times daily as needed for anxiety. Patient not taking: Reported on 02/20/2023 02/11/23   Kathlen Mody, MD  ibuprofen (ADVIL) 800 MG tablet Take by mouth.    [provider]  ketoconazole (NIZORAL) 2 % shampoo Apply 1 Application topically 2 (two) times a week. 01/22/23   [provider]  oxyCODONE (ROXICODONE) 5 MG immediate release tablet Take 1 tablet (5 mg total) by mouth every 6 (six) hours as needed. 07/24/23 07/23/24  Evon Slack, PA-C  torsemide (DEMADEX) 20 MG tablet Take 1 tablet (20 mg total) by mouth daily. 02/11/23 02/11/24  Kathlen Mody, MD      Allergies     Eicosapentaenoic acid (epa) (fish), Fish-derived products, Iodine, and Tramadol    Review of Systems   Review of Systems  HENT:  Positive for dental problem.   All other systems reviewed and are negative.   Physical Exam Updated Vital Signs BP 120/69 (BP Location: Right Arm)   Pulse 88   Temp 98.8 F (37.1 C) (Oral)   Resp 17   Ht 5\' 4"  (1.626 m)   Wt 74.8 kg   LMP 08/18/2018 Comment: preg test negative  SpO2 99%   BMI 28.31 kg/m   Physical Exam Vitals and nursing note reviewed.  Constitutional:      Appearance: She is well-developed.  HENT:     Head: Normocephalic and atraumatic.     Mouth/Throat:     Comments: Teeth largely in fair/poor dentition, right lower 1st molar is cracked and broken w/some decay, surrounding gingiva seems a bit inflamed without bleeding or drainable fluid collection, handling secretions appropriately, no trismus, no facial or neck swelling, normal phonation without stridor Eyes:     Conjunctiva/sclera: Conjunctivae normal.     Pupils: Pupils are equal, round, and reactive to light.  Cardiovascular:     Rate and Rhythm: Normal rate and regular rhythm.     Heart sounds: Normal heart sounds.  Pulmonary:     Effort: Pulmonary effort is normal.     Breath sounds: Normal breath sounds.  Abdominal:     General: Bowel sounds are normal.     Palpations: Abdomen is soft.  Musculoskeletal:        General: Normal range of motion.     Cervical back: Normal range of motion.  Skin:    General: Skin is warm and dry.  Neurological:     Mental Status: She is alert and oriented to person, place, and time.     ED Results / Procedures / Treatments   Labs (all labs ordered are listed, but only abnormal results are displayed) Labs Reviewed - No data to display  EKG None  Radiology No results found.  Procedures Procedures    Medications Ordered in ED Medications  penicillin v potassium (VEETID) tablet 500 mg (500 mg Oral Given 10/25/23 0108)   naproxen (NAPROSYN) tablet 500 mg (500 mg Oral Given 10/25/23 0108)  lidocaine (XYLOCAINE) 2 % viscous mouth solution 15 mL (15 mLs Mouth/Throat Given 10/25/23 0109)    ED Course/ Medical Decision Making/ A&P                                 Medical Decision Making Risk Prescription drug management.   37 year old female presenting to the ED with right lower tooth broken while eating [place] this evening.  States her teeth are already brittle due to prior radiation therapy.  Right lower first molar is decayed appearing and severely broken.  There does appear to be some gingival inflammation without discrete abscess or fluid collection.  She has no facial or neck swelling, handling secretions well, no stridor.  Not clinically concerning for Ludwig's angina.  She already has dentist appointment scheduled next week for follow-up.  Return here for new concerns.  Final Clinical Impression(s) / ED Diagnoses Final diagnoses:  Dental injury, initial encounter    Rx / DC Orders ED Discharge Orders          Ordered    lidocaine (XYLOCAINE) 2 % solution  As needed        10/25/23 0116    penicillin v potassium (VEETID) 500 MG tablet  4 times daily        10/25/23 0116    naproxen (NAPROSYN) 500 MG tablet  2 times daily        10/25/23 0116              Garlon Hatchet, PA-C 10/25/23 Herold Harms    Zadie Rhine, MD 10/25/23 [phone]

## 2023-10-25 NOTE — Discharge Instructions (Signed)
Take the prescribed medication as directed. Follow-up with your dentist on Thursday as scheduled. Return to the ED for new or worsening symptoms. 

## 2023-10-27 ENCOUNTER — Encounter: Payer: Medicaid Other | Admitting: Occupational Therapy

## 2023-11-03 ENCOUNTER — Encounter: Payer: Medicaid Other | Admitting: Occupational Therapy

## 2024-01-09 IMAGING — CT CT ABD-PELV W/O CM
2 of 4 series · 16 of 46 positions shown, 18 images · non-contrast
Comparison: CT abdomen and pelvis dated December 23, 2012

CLINICAL DATA: Rectal bleeding, left lower quadrant pain.



[Series 2: axial st · axial · 0.80mm/px · z∈[-567,-97]mm · 13 of 104 slices shown, 15 images]
[im 5/104  soft-tissue]
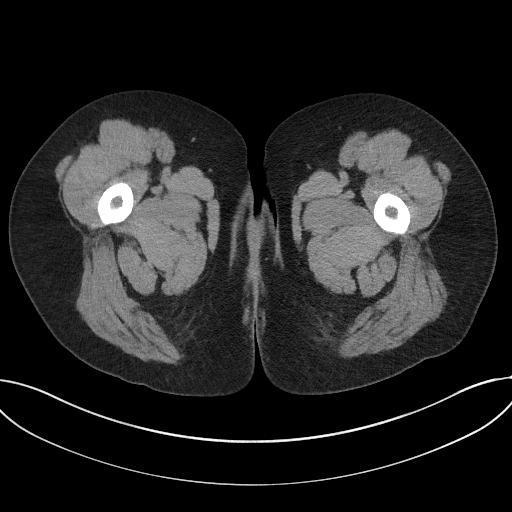
[im 5/104  bone]
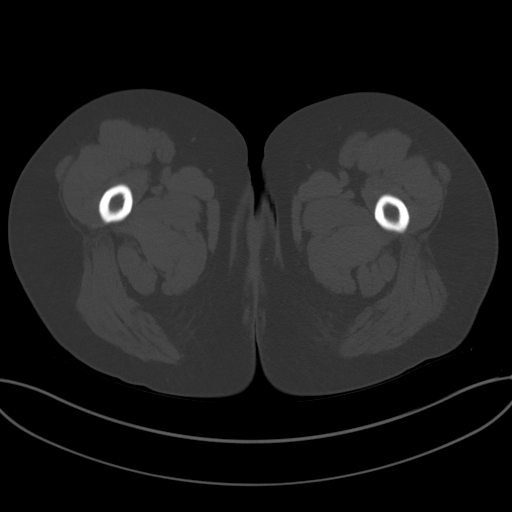
[im 13/104  soft-tissue]
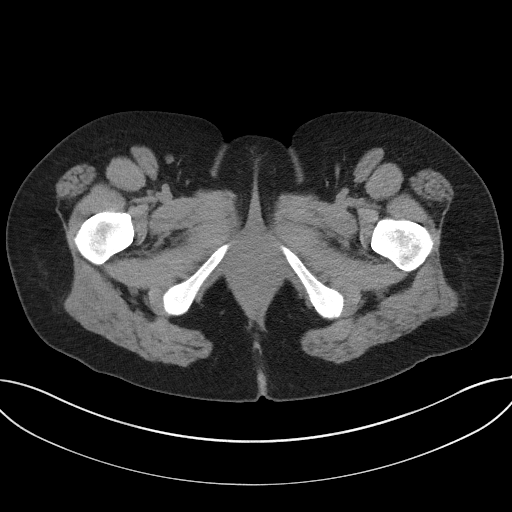
[im 22/104  soft-tissue]
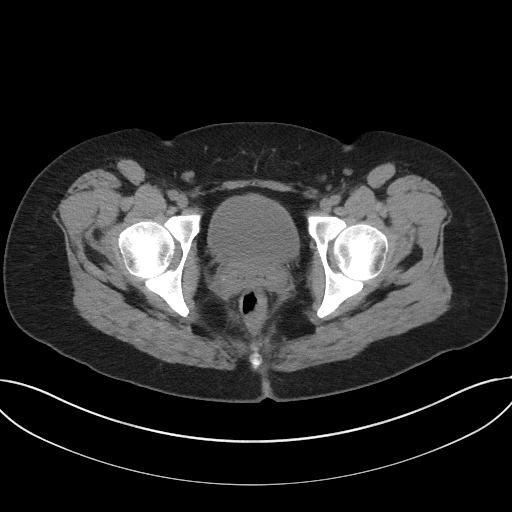
[im 31/104  soft-tissue]
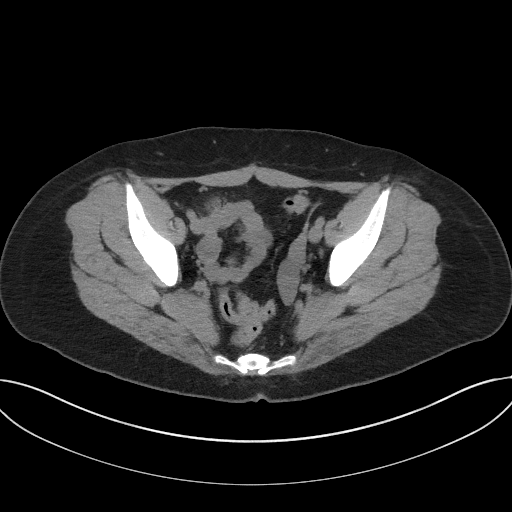
[im 35/104  soft-tissue]
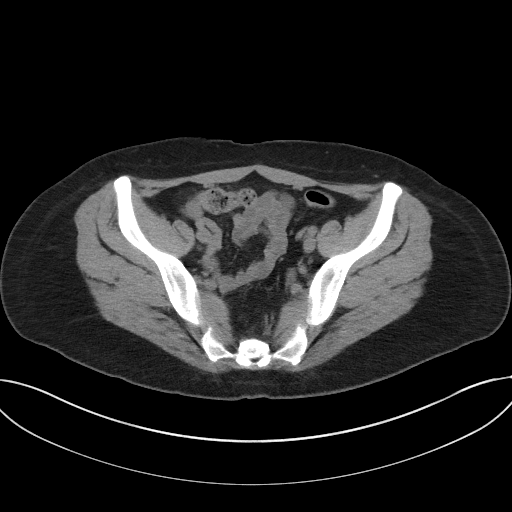
[im 43/104  soft-tissue]
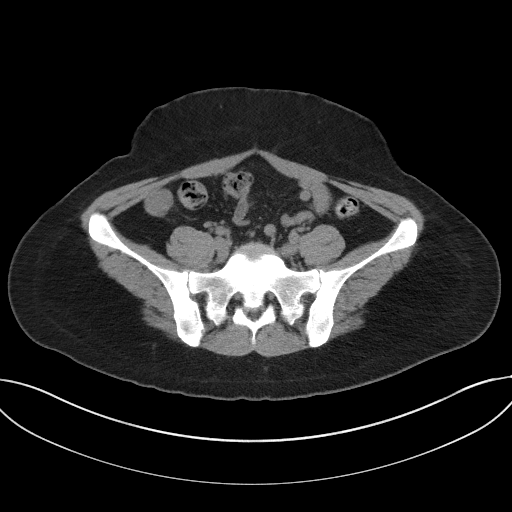
[im 52/104  soft-tissue]
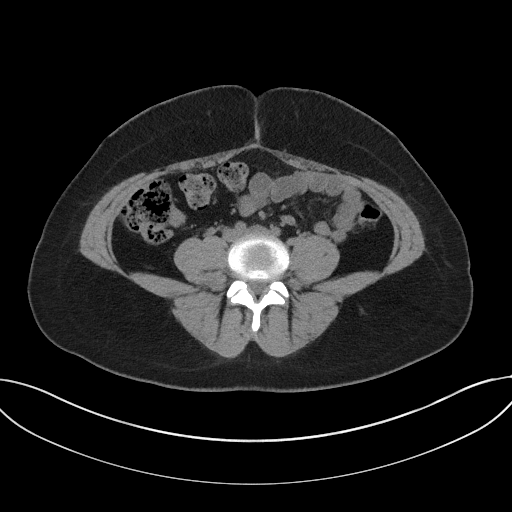
[im 61/104  soft-tissue]
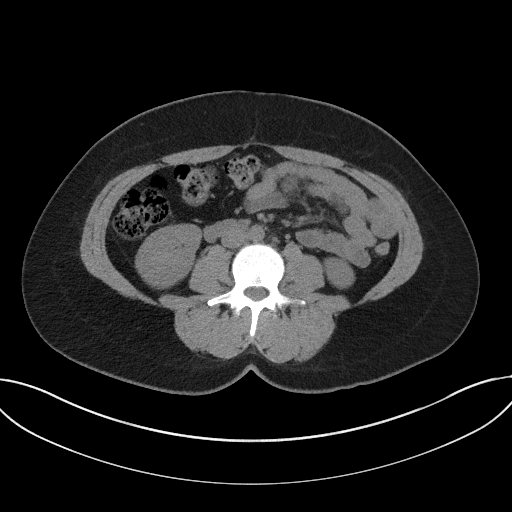
[im 69/104  soft-tissue]
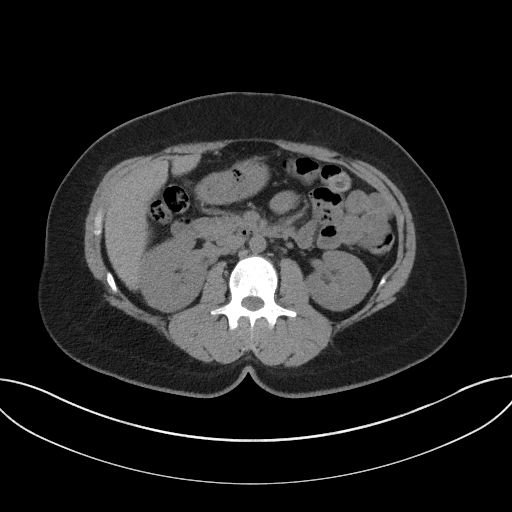
[im 69/104  bone]
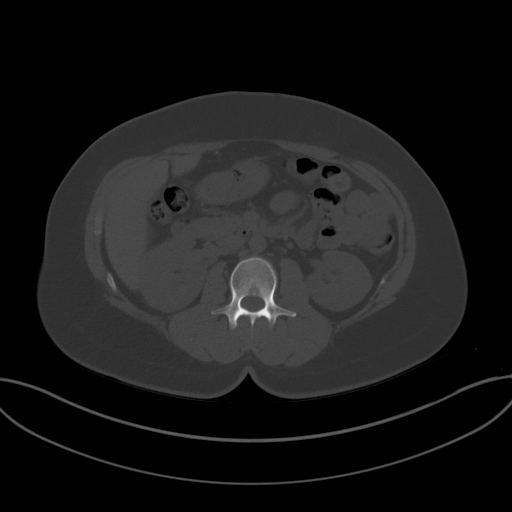
[im 73/104  soft-tissue]
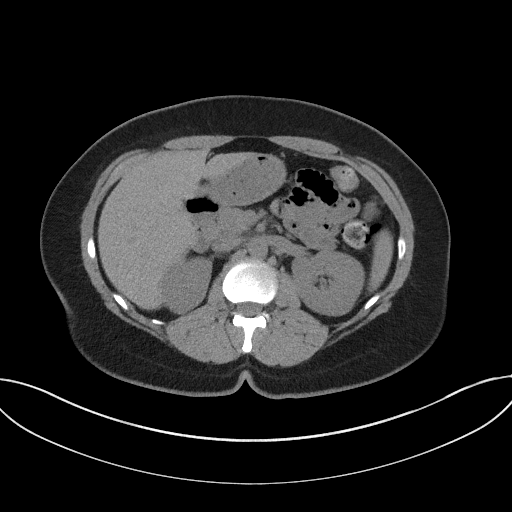
[im 82/104  soft-tissue]
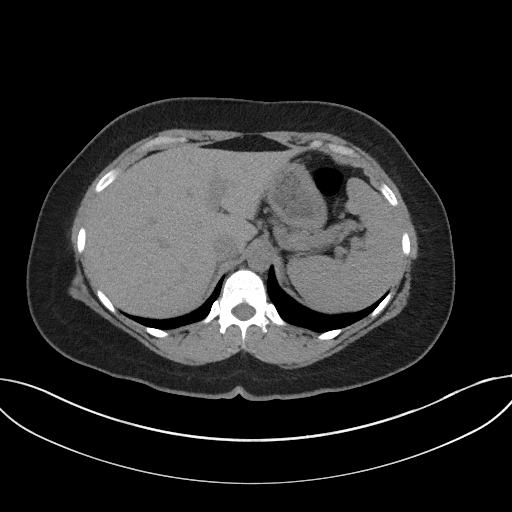
[im 91/104  soft-tissue]
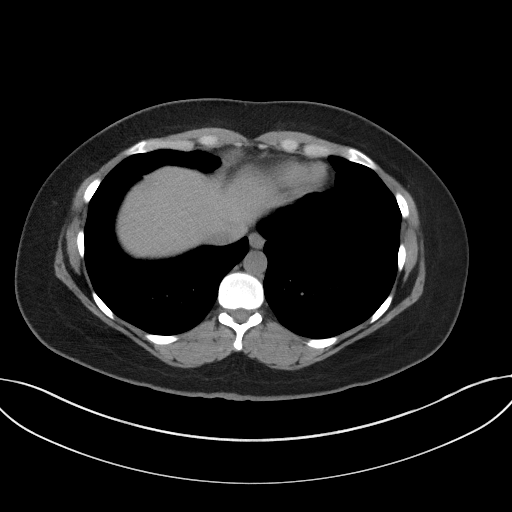
[im 99/104  soft-tissue]
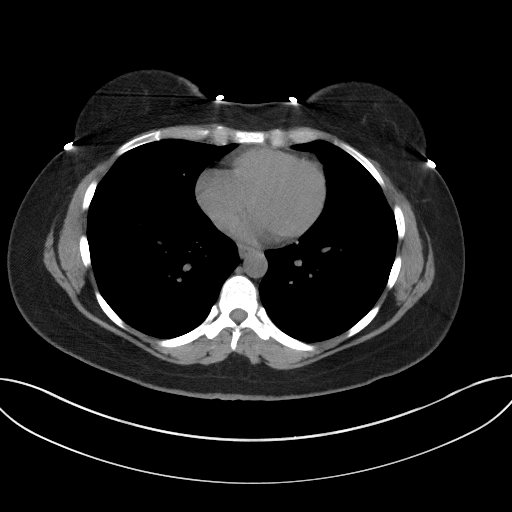

[Series 5: coronal st · coronal · 0.82mm/px · 3 of 95 slices shown]
[im 32/95  soft-tissue]
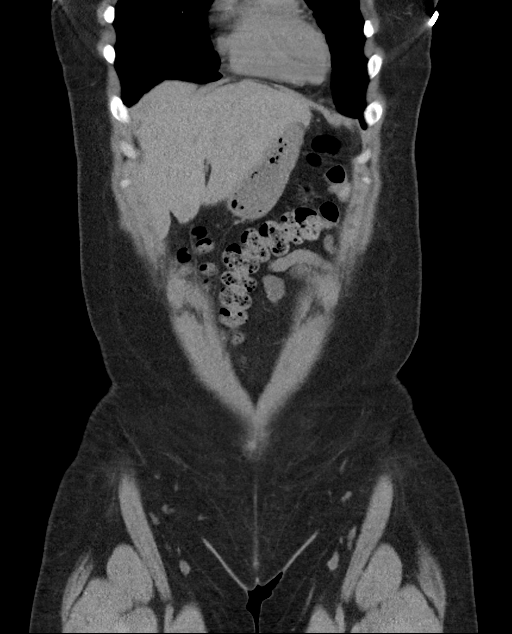
[im 42/95  soft-tissue]
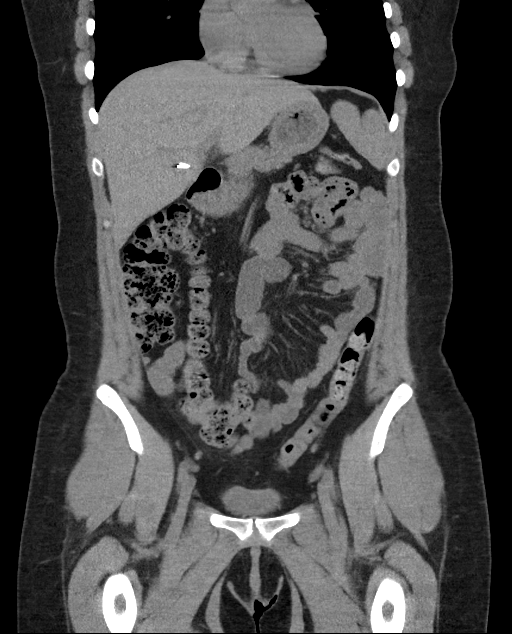
[im 53/95  soft-tissue]
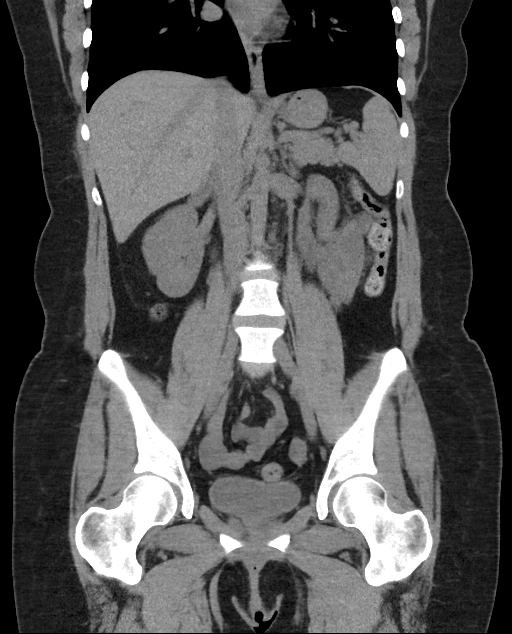

[16 of 46 positions shown; findings below may reference images not displayed]

FINDINGS: Lower chest: No acute abnormality.

Hepatobiliary: No focal liver abnormality is seen. Status post
cholecystectomy. No biliary dilatation.

Pancreas: Unremarkable. No pancreatic ductal dilatation or
surrounding inflammatory changes.

Spleen: Normal in size without focal abnormality.

Adrenals/Urinary Tract: Adrenal glands are unremarkable. Kidneys are
normal, without renal calculi, focal lesion, or hydronephrosis.
Bladder is unremarkable.

Stomach/Bowel: Stomach is within normal limits. Appendix appears
normal. No evidence of bowel wall thickening, distention, or
inflammatory changes.

Vascular/Lymphatic: No significant vascular findings are present. No
enlarged abdominal or pelvic lymph nodes.

Reproductive: Simple appearing left adnexal cyst measuring up to
cm, likely physiologic with no further follow-up imaging
recommended. No adnexal masses.

Other: No abdominal wall hernia or abnormality. No abdominopelvic
ascites.

Musculoskeletal: No acute or significant osseous findings.
IMPRESSION: No acute findings in the abdomen or pelvis.

## 2024-02-09 ENCOUNTER — Emergency Department

## 2024-02-09 ENCOUNTER — Other Ambulatory Visit: Payer: Self-pay

## 2024-02-09 DIAGNOSIS — Z79899 Other long term (current) drug therapy: Secondary | ICD-10-CM | POA: Insufficient documentation

## 2024-02-09 DIAGNOSIS — Z85841 Personal history of malignant neoplasm of brain: Secondary | ICD-10-CM | POA: Diagnosis not present

## 2024-02-09 DIAGNOSIS — R0789 Other chest pain: Principal | ICD-10-CM | POA: Insufficient documentation

## 2024-02-09 DIAGNOSIS — F419 Anxiety disorder, unspecified: Secondary | ICD-10-CM | POA: Diagnosis not present

## 2024-02-09 DIAGNOSIS — K219 Gastro-esophageal reflux disease without esophagitis: Secondary | ICD-10-CM | POA: Diagnosis not present

## 2024-02-09 LAB — BASIC METABOLIC PANEL
Anion gap: 13 (ref 5–15)
BUN: 12 mg/dL (ref 6–20)
CO2: 18 mmol/L — ABNORMAL LOW (ref 22–32)
Calcium: 9.2 mg/dL (ref 8.9–10.3)
Chloride: 104 mmol/L (ref 98–111)
Creatinine, Ser: 0.59 mg/dL (ref 0.44–1.00)
GFR, Estimated: >60 mL/min (ref >60)
Glucose, Bld: 118 mg/dL — ABNORMAL HIGH (ref 70–99)
Potassium: 4 mmol/L (ref 3.5–5.1)
Sodium: 135 mmol/L (ref 135–145)

## 2024-02-09 LAB — TROPONIN I (HIGH SENSITIVITY): Troponin I (High Sensitivity): <2 ng/L (ref <18)

## 2024-02-09 LAB — CBC
HCT: 40.5 % (ref 36.0–46.0)
Hemoglobin: 14.4 g/dL (ref 12.0–15.0)
MCH: 32.7 pg (ref 26.0–34.0)
MCHC: 35.6 g/dL (ref 30.0–36.0)
MCV: 91.8 fL (ref 80.0–100.0)
Platelets: 282 10*3/uL (ref 150–400)
RBC: 4.41 MIL/uL (ref 3.87–5.11)
RDW: 12.3 % (ref 11.5–15.5)
WBC: 6.6 10*3/uL (ref 4.0–10.5)
nRBC: 0 % (ref 0.0–0.2)

## 2024-02-09 NOTE — ED Triage Notes (Addendum)
 Pt arrived POV for CP x2 days reports "feels like there are air bubbles in my chest" also having SOB and nausea. Reports CP is upper chest and radiates down to epigastric pain. NAD noted, A&O x4. VSS. Reports hx of brain tumor, not receiving treatments at this time and polyarthralgia.

## 2024-02-09 NOTE — ED Provider Triage Note (Signed)
 Emergency Medicine Provider Triage Evaluation Note  Denise Bryan , a 38 y.o. female  was evaluated in triage.  Pt complains of chest pain that began two days ago, states it feels like there are air bubbles coming up her chest. Worse with activity. Radiates to between her shoulder blades.   Review of Systems  Positive: Chest pain, extremity swelling Negative:   Physical Exam  Ht 5\' 4"  (1.626 m)   Wt 83 kg   LMP 08/18/2018 Comment: preg test negative  BMI 31.41 kg/m  Gen:   Awake, no distress   Resp:  Normal effort  MSK:   Moves extremities without difficulty  Other:    Medical Decision Making  Medically screening exam initiated at 8:12 PM.  Appropriate orders placed.  William Hamburger was informed that the remainder of the evaluation will be completed by another provider, this initial triage assessment does not replace that evaluation, and the importance of remaining in the ED until their evaluation is complete.    Cameron Ali, PA-C 02/09/24 2016

## 2024-02-09 NOTE — ED Notes (Signed)
 Per Dr. Lenard Lance  and PA-C Lauren, no D-dimer study at this time

## 2024-02-10 ENCOUNTER — Encounter: Payer: Self-pay | Admitting: Family Medicine

## 2024-02-10 ENCOUNTER — Observation Stay

## 2024-02-10 ENCOUNTER — Observation Stay
Admission: EM | Admit: 2024-02-10 | Discharge: 2024-02-10 | Disposition: A | Discharge status: Home / Self Care | Attending: Internal Medicine | Admitting: Internal Medicine

## 2024-02-10 DIAGNOSIS — F419 Anxiety disorder, unspecified: Secondary | ICD-10-CM | POA: Diagnosis not present

## 2024-02-10 DIAGNOSIS — R079 Chest pain, unspecified: Principal | ICD-10-CM | POA: Diagnosis present

## 2024-02-10 DIAGNOSIS — Z85841 Personal history of malignant neoplasm of brain: Secondary | ICD-10-CM | POA: Diagnosis not present

## 2024-02-10 DIAGNOSIS — K219 Gastro-esophageal reflux disease without esophagitis: Secondary | ICD-10-CM

## 2024-02-10 LAB — BASIC METABOLIC PANEL
Anion gap: 10 (ref 5–15)
BUN: 12 mg/dL (ref 6–20)
CO2: 24 mmol/L (ref 22–32)
Calcium: 9.1 mg/dL (ref 8.9–10.3)
Chloride: 103 mmol/L (ref 98–111)
Creatinine, Ser: 0.64 mg/dL (ref 0.44–1.00)
GFR, Estimated: >60 mL/min (ref >60)
Glucose, Bld: 107 mg/dL — ABNORMAL HIGH (ref 70–99)
Potassium: 3.6 mmol/L (ref 3.5–5.1)
Sodium: 137 mmol/L (ref 135–145)

## 2024-02-10 LAB — NM MYOCAR MULTI W/SPECT W/WALL MOTION / EF
Base ST Depression (mm): 0 mm
Estimated workload: 1
Exercise duration (min): 1 min
LV dias vol: 49 mL (ref 46–106)
LV sys vol: 18 mL
MPHR: 185 {beats}/min
Nuc Stress EF: 63 %
Peak HR: 133 {beats}/min
Percent HR: 71 %
Rest HR: 77 {beats}/min
Rest Nuclear Isotope Dose: 10.6 mCi
SDS: 4
SRS: 3
SSS: 9
ST Depression (mm): 0 mm
Stress Nuclear Isotope Dose: 29.8 mCi
TID: 0.97

## 2024-02-10 LAB — HEPATIC FUNCTION PANEL
ALT: 31 U/L (ref 0–44)
AST: 21 U/L (ref 15–41)
Albumin: 4.2 g/dL (ref 3.5–5.0)
Alkaline Phosphatase: 77 U/L (ref 38–126)
Bilirubin, Direct: 0.1 mg/dL (ref 0.0–0.2)
Indirect Bilirubin: 0.4 mg/dL (ref 0.3–0.9)
Total Bilirubin: 0.5 mg/dL (ref 0.0–1.2)
Total Protein: 7.4 g/dL (ref 6.5–8.1)

## 2024-02-10 LAB — HCG, QUANTITATIVE, PREGNANCY: hCG, Beta Chain, Quant, S: <1 m[IU]/mL (ref <5)

## 2024-02-10 LAB — D-DIMER, QUANTITATIVE: D-Dimer, Quant: 0.28 ug{FEU}/mL (ref 0.00–0.50)

## 2024-02-10 LAB — HIV ANTIBODY (ROUTINE TESTING W REFLEX): HIV Screen 4th Generation wRfx: NONREACTIVE

## 2024-02-10 LAB — LIPASE, BLOOD: Lipase: 31 U/L (ref 11–51)

## 2024-02-10 LAB — TROPONIN I (HIGH SENSITIVITY): Troponin I (High Sensitivity): <2 ng/L (ref <18)

## 2024-02-10 MED ORDER — HYDROXYZINE HCL 10 MG PO TABS: 10.0000 mg | ORAL_TABLET | Freq: Three times a day (TID) | ORAL | Status: DC | PRN

## 2024-02-10 MED ORDER — KETOCONAZOLE 2 % EX SHAM
1.0000 | MEDICATED_SHAMPOO | CUTANEOUS | Status: DC
  Filled 2024-02-10: qty 120

## 2024-02-10 MED ORDER — OXYCODONE HCL 5 MG PO TABS: 5.0000 mg | ORAL_TABLET | Freq: Four times a day (QID) | ORAL | Status: DC | PRN

## 2024-02-10 MED ORDER — ASPIRIN 81 MG PO TBEC
81.0000 mg | DELAYED_RELEASE_TABLET | Freq: Every day | ORAL | Status: DC
  Administered 2024-02-10: 81 mg via ORAL
  Filled 2024-02-10: qty 1

## 2024-02-10 MED ORDER — DIPHENHYDRAMINE HCL 25 MG PO CAPS
25.0000 mg | ORAL_CAPSULE | Freq: Once | ORAL | Status: AC
  Administered 2024-02-10: 25 mg via ORAL
  Filled 2024-02-10: qty 1

## 2024-02-10 MED ORDER — ACETAMINOPHEN 325 MG PO TABS: 650.0000 mg | ORAL_TABLET | ORAL | Status: DC | PRN

## 2024-02-10 MED ORDER — TORSEMIDE 20 MG PO TABS
20.0000 mg | ORAL_TABLET | Freq: Every day | ORAL | Status: DC
  Administered 2024-02-10: 20 mg via ORAL
  Filled 2024-02-10: qty 1

## 2024-02-10 MED ORDER — ONDANSETRON HCL 4 MG/2ML IJ SOLN: 4.0000 mg | Freq: Four times a day (QID) | INTRAMUSCULAR | Status: DC | PRN

## 2024-02-10 MED ORDER — LIDOCAINE VISCOUS HCL 2 % MT SOLN: 15.0000 mL | OROMUCOSAL | Status: DC | PRN

## 2024-02-10 MED ORDER — ENOXAPARIN SODIUM 40 MG/0.4ML IJ SOSY
0.5000 mg/kg | PREFILLED_SYRINGE | INTRAMUSCULAR | Status: DC
  Administered 2024-02-10: 42.5 mg via SUBCUTANEOUS
  Filled 2024-02-10: qty 0.8

## 2024-02-10 MED ORDER — TECHNETIUM TC 99M TETROFOSMIN IV KIT
30.0000 | PACK | Freq: Once | INTRAVENOUS | Status: AC | PRN
  Administered 2024-02-10: 29.83 via INTRAVENOUS

## 2024-02-10 MED ORDER — FENTANYL CITRATE PF 50 MCG/ML IJ SOSY
25.0000 ug | PREFILLED_SYRINGE | INTRAMUSCULAR | Status: DC | PRN
  Administered 2024-02-10: 25 ug via INTRAVENOUS
  Filled 2024-02-10: qty 1

## 2024-02-10 MED ORDER — ONDANSETRON HCL 4 MG/2ML IJ SOLN
4.0000 mg | Freq: Once | INTRAMUSCULAR | Status: AC
  Administered 2024-02-10: 4 mg via INTRAVENOUS
  Filled 2024-02-10: qty 2

## 2024-02-10 MED ORDER — REGADENOSON 0.4 MG/5ML IV SOLN
0.4000 mg | Freq: Once | INTRAVENOUS | Status: AC
  Administered 2024-02-10: 0.4 mg via INTRAVENOUS

## 2024-02-10 MED ORDER — NITROGLYCERIN 0.4 MG SL SUBL: 0.4000 mg | SUBLINGUAL_TABLET | SUBLINGUAL | Status: DC | PRN

## 2024-02-10 MED ORDER — KETOROLAC TROMETHAMINE 15 MG/ML IJ SOLN
15.0000 mg | Freq: Once | INTRAMUSCULAR | Status: AC
  Administered 2024-02-10: 15 mg via INTRAVENOUS
  Filled 2024-02-10: qty 1

## 2024-02-10 MED ORDER — OXYCODONE HCL 5 MG PO TABS
5.0000 mg | ORAL_TABLET | Freq: Once | ORAL | Status: AC
  Administered 2024-02-10: 5 mg via ORAL
  Filled 2024-02-10: qty 1

## 2024-02-10 MED ORDER — ASPIRIN 81 MG PO CHEW
324.0000 mg | CHEWABLE_TABLET | Freq: Once | ORAL | Status: AC
  Administered 2024-02-10: 324 mg via ORAL
  Filled 2024-02-10: qty 4

## 2024-02-10 MED ORDER — FAMOTIDINE IN NACL 20-0.9 MG/50ML-% IV SOLN
20.0000 mg | Freq: Once | INTRAVENOUS | Status: AC
  Administered 2024-02-10: 20 mg via INTRAVENOUS
  Filled 2024-02-10: qty 50

## 2024-02-10 MED ORDER — SODIUM CHLORIDE 0.9 % IV SOLN: INTRAVENOUS | Status: DC

## 2024-02-10 MED ORDER — KETOROLAC TROMETHAMINE 30 MG/ML IJ SOLN
15.0000 mg | Freq: Once | INTRAMUSCULAR | Status: AC
  Administered 2024-02-10: 15 mg via INTRAVENOUS
  Filled 2024-02-10: qty 1

## 2024-02-10 MED ORDER — NITROGLYCERIN 2 % TD OINT: 0.5000 [in_us] | TOPICAL_OINTMENT | Freq: Once | TRANSDERMAL | Status: DC

## 2024-02-10 MED ORDER — TECHNETIUM TC 99M TETROFOSMIN IV KIT
10.0000 | PACK | Freq: Once | INTRAVENOUS | Status: AC | PRN
  Administered 2024-02-10: 10.61 via INTRAVENOUS

## 2024-02-10 NOTE — H&P (Signed)
 [place]   PATIENT NAME: Denise Bryan    MR#:  [id number]  DATE OF BIRTH:  03/20/1986  DATE OF ADMISSION:  02/10/2024  PRIMARY CARE PHYSICIAN: [facility]   Patient is coming from: Home  REQUESTING/REFERRING PHYSICIAN: Chiquita Loth, MD  CHIEF COMPLAINT:   Chief Complaint  Patient presents with   Chest Pain    HISTORY OF PRESENT ILLNESS:  Denise Bryan is a 38 y.o. female with medical history significant for anxiety, brain cancer status post radiotherapy, GERD, migraine headache and neuromuscular disorder, who presented to the emergency room with acute onset of chest pain which has been constant over the last couple of days with associated nausea without vomiting as well as dyspnea.  Her chest pain has been radiating to her epigastric area.  She denied any diaphoresis or palpitations.  No headache or dizziness or blurred vision.  No paresthesias or focal muscle weakness..  She has been following with rheumatology for neuralgia and lower extremity edema.  No fever or chills.  No dysuria, oliguria or hematuria or flank pain.  No cough or wheezing or hemoptysis.  No other bleeding diathesis.  ED Course: When she came to the ER, vital signs were within normal.  Labs revealed a CO2 of 18 otherwise unremarkable CMP.  High sensitive troponin I was less than 2 twice.  CBC was within normal.  D-dimer was negative at 0.28. EKG as reviewed by me : showed normal sinus rhythm 93 without acute changes, Imaging: 2 view chest x-ray showed no acute cardiopulmonary disease.  The patient was given 4 baby aspirin, an inch of Nitropaste, 20 milligrams of IV Pepcid, 4 mg of IV Zofran milligrams of and 50 mg of IV Toradol.  She will be admitted to an observation cardiac telemetry bed for further evaluation and management PAST MEDICAL HISTORY:   Past Medical History:  Diagnosis Date   Anxiety    Brain cancer (HCC)    Dyspnea    Endometriosis    Family history  of ovarian cancer    Gastroesophageal reflux disease 09/20/2008   no problems since gallbladder taken out!! 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HCC)    Neuromuscular disorder (HCC)    Spontaneous abortion    Unspecified blood type, rh negative     PAST SURGICAL HISTORY:   Past Surgical History:  Procedure Laterality Date   BRAIN TUMOR EXCISION     CHOLECYSTECTOMY  2007   CYSTOSCOPY N/A 09/01/2018   Procedure: CYSTOSCOPY;  Surgeon: Conard Novak, MD;  Location: [facility] ORS;  Service: Gynecology;  Laterality: N/A;   DIAGNOSTIC LAPAROSCOPY  04/26/2010   IUD EXPULSION/PELVIC PAIN   DILATION AND CURETTAGE OF UTERUS  08/10/10; 08/06/07   HYDATIIDIFORM MOLE/POC, POC   DIRECT LARYNGOSCOPY N/A 10/10/2020   Procedure: INJECTION LARYNGOPLASTY C VOICE GEL, LEFT ONE VOCAL FOLD;  Surgeon: Bud Face, MD;  Location: [facility] ORS;  Service: ENT;  Laterality: N/A;   gallstones  08/05/2006   HYSTEROSCOPY  04/26/2010   IUD EXPULSION /PELVIC PAIN   LAPAROSCOPIC HYSTERECTOMY N/A 09/01/2018   Procedure: HYSTERECTOMY TOTAL LAPAROSCOPIC BILATERAL SALPINGECTOMY;  Surgeon: Conard Novak, MD;  Location: [facility] ORS;  Service: Gynecology;  Laterality: N/A;   LAPAROSCOPY N/A 08/21/2017   Procedure: LAPAROSCOPY DIAGNOSTIC;  Surgeon:  Conard Novak, MD;  Location: [facility] ORS;  Service: Gynecology;  Laterality: N/A;   LAPAROSCOPY N/A 07/30/2018   Procedure: LAPAROSCOPY DIAGNOSTIC WITH BILATERAL SALPINGECTOMY;  Surgeon: Conard Novak, MD;  Location: [facility] ORS;  Service: Gynecology;  Laterality: N/A;   SPINAL FUSION  2021   TUBAL LIGATION  01/2012   SDJ    SOCIAL HISTORY:   Social History   Tobacco Use   Smoking status: Never   Smokeless tobacco: Never  Substance Use Topics   Alcohol use: No    FAMILY HISTORY:   Family History   Problem Relation Age of Onset   Diabetes Mother    Hypertension Mother    Cancer Mother 74       UTERINE   Colon cancer Mother    Cancer Sister 26       CX   Diabetes Maternal Uncle    Diabetes Maternal Grandmother    Hypertension Maternal Grandmother    Lung disease Maternal Grandfather    Cancer Maternal Grandfather        LUNG   Heart disease Paternal Grandmother    Hypertension Paternal Grandmother     DRUG ALLERGIES:   Allergies  Allergen Reactions   Eicosapentaenoic Acid (Epa) (Fish) Anaphylaxis   Fish-Derived Products Anaphylaxis   Iodine Anaphylaxis   Tramadol Anaphylaxis and Hives    REVIEW OF SYSTEMS:   ROS As per history of present illness. All pertinent systems were reviewed above. Constitutional, HEENT, cardiovascular, respiratory, GI, GU, musculoskeletal, neuro, psychiatric, endocrine, integumentary and hematologic systems were reviewed and are otherwise negative/unremarkable except for positive findings mentioned above in the HPI.   MEDICATIONS AT HOME:   Prior to Admission medications   Medication Sig Start Date End Date Taking? Authorizing Provider  hydrOXYzine (ATARAX) 10 MG tablet Take 1 tablet (10 mg total) by mouth 3 (three) times daily as needed for anxiety. Patient not taking: Reported on 02/20/2023 02/11/23   Kathlen Mody, MD  ibuprofen (ADVIL) 800 MG tablet Take by mouth.    [provider]  ketoconazole (NIZORAL) 2 % shampoo Apply 1 Application topically 2 (two) times a week. 01/22/23   [provider]  lidocaine (XYLOCAINE) 2 % solution Use as directed 15 mLs in the mouth or throat as needed for mouth pain. 10/25/23   Garlon Hatchet, PA-C  naproxen (NAPROSYN) 500 MG tablet Take 1 tablet (500 mg total) by mouth 2 (two) times daily. 10/25/23   Garlon Hatchet, PA-C  oxyCODONE (ROXICODONE) 5 MG immediate release tablet Take 1 tablet (5 mg total) by mouth every 6 (six) hours as needed. 07/24/23 07/23/24  Evon Slack, PA-C  torsemide  (DEMADEX) 20 MG tablet Take 1 tablet (20 mg total) by mouth daily. 02/11/23 02/11/24  Kathlen Mody, MD      VITAL SIGNS:  Blood pressure 101/69, pulse 70, temperature 98.2 F (36.8 C), temperature source Oral, resp. rate 11, height 5\' 4"  (1.626 m), weight 83 kg, last menstrual period 08/18/2018, SpO2 99%, unknown if currently breastfeeding.  PHYSICAL EXAMINATION:  Physical Exam  GENERAL:  38 y.o.-year-old patient lying in the bed with no acute distress.  EYES: Pupils equal, round, reactive to light and accommodation. No scleral icterus. Extraocular muscles intact.  HEENT: Head atraumatic, normocephalic. Oropharynx and nasopharynx clear.  NECK:  Supple, no jugular venous distention. No thyroid enlargement, no tenderness.  LUNGS: Normal breath sounds bilaterally, no wheezing, rales,rhonchi or crepitation. No use of accessory muscles of respiration.  CARDIOVASCULAR: Regular rate and rhythm,  S1, S2 normal. No murmurs, rubs, or gallops.  ABDOMEN: Soft, nondistended, nontender. Bowel sounds present. No organomegaly or mass.  EXTREMITIES: No pedal edema, cyanosis, or clubbing.  NEUROLOGIC: Cranial nerves II through XII are intact. Muscle strength 5/5 in all extremities. Sensation intact. Gait not checked.  PSYCHIATRIC: The patient is alert and oriented x 3.  Normal affect and good eye contact. SKIN: No obvious rash, lesion, or ulcer.   LABORATORY PANEL:   CBC Recent Labs  Lab 02/09/24 2035  WBC 6.6  HGB 14.4  HCT 40.5  PLT 282   ------------------------------------------------------------------------------------------------------------------  Chemistries  Recent Labs  Lab 02/09/24 2035 02/09/24 2317  NA 135  --   K 4.0  --   CL 104  --   CO2 18*  --   GLUCOSE 118*  --   BUN 12  --   CREATININE 0.59  --   CALCIUM 9.2  --   AST  --  21  ALT  --  31  ALKPHOS  --  77  BILITOT  --  0.5    ------------------------------------------------------------------------------------------------------------------  Cardiac Enzymes No results for input(s): "TROPONINI" in the last 168 hours. ------------------------------------------------------------------------------------------------------------------  RADIOLOGY:  DG Chest 2 View Result Date: 02/09/2024 CLINICAL DATA:  Chest pain for 2 days. Shortness of breath and nausea. Epigastric pain. EXAM: CHEST - 2 VIEW COMPARISON:  02/10/2023 FINDINGS: Normal heart size and pulmonary vascularity. No focal airspace disease or consolidation in the lungs. No blunting of costophrenic angles. No pneumothorax. Mediastinal contours appear intact. Postoperative changes in the cervical spine. Surgical clips in the right upper quadrant. IMPRESSION: No active cardiopulmonary disease. Electronically Signed   By: Burman Nieves M.D.   On: 02/09/2024 21:24      IMPRESSION AND PLAN:  Assessment and Plan: * Chest pain - The patient will be admitted to an observation cardiac telemetry bed. - Will follow serial troponins and EKGs. - The patient will be placed on aspirin as well as p.r.n. sublingual nitroglycerin and morphine sulfate for pain. - We will obtain a cardiology consult in a.m. for further cardiac risk stratification. - I notified Dr. Corky Sing about the patient.   GERD without esophagitis - We will place the patient on PPI therapy.  History of brain cancer - We will continue pain management.  Anxiety - We will continue as needed Atarax.   DVT prophylaxis: Lovenox.  Advanced Care Planning:  Code Status: full code.  Family Communication:  The plan of care was discussed in details with the patient (and family). I answered all questions. The patient agreed to proceed with the above mentioned plan. Further management will depend upon hospital course. Disposition Plan: Back to previous home environment Consults called: Cardiology All the  records are reviewed and case discussed with ED provider.  Status is: Observation  I certify that at the time of admission, it is my clinical judgment that the patient will require  hospital care extending less than 2 midnights.                            Dispo: The patient is from: Home              Anticipated d/c is to: Home              Patient currently is not medically stable to d/c.              Difficult to place patient: No  Christop Hippert A  Skylyn Slezak M.D on 02/10/2024 at 5:33 AM  [facility]   From 7 PM-7 AM, contact night-coverage [email or web address]  CC: Primary care physician; [facility]

## 2024-02-10 NOTE — Hospital Course (Addendum)
 Taken from H&P  Denise Bryan is a 38 y.o. female with medical history significant for anxiety, brain cancer status post radiotherapy, GERD, migraine headache and neuromuscular disorder, who presented to the emergency room with acute onset of chest pain which has been constant over the last couple of days with associated nausea without vomiting as well as dyspnea.  Her chest pain has been radiating to her epigastric area.   On presentation stable vital, labs with metabolic acidosis, CO2 of 18 otherwise mostly unremarkable.  Negative troponin x 2.  Negative D-dimer will EKG with NSR, no acute changes Chest x-ray negative for any acute abnormality.  3/25: Hemodynamically stable.  Patient underwent stress testing which was normal.  Patient was having nonspecific chest pain.  ACS ruled out. Patient with very nonspecific aches and pains and keeps saying that I am swollen everywhere but on exam there was no swelling.  She had pretty extensive rheumatologic workup done recently which was mostly negative except positive ANA which seems nonspecific.  She was keeps saying that I know my body and something is wrong and if you do not diagnose properly then be ready for a big lawsuit, my mom is a Clinical research associate.  Per patient she has seen all the specialist and nobody can figure out what is wrong with her.  Patient likely will get benefit from cognitive behavioral therapy.  She was given a dose of Toradol and repeat BMP with resolution of metabolic acidosis which was very nonspecific.  Patient should continue her home medications and need to have a follow-up with her outpatient providers for further evaluation.

## 2024-02-10 NOTE — ED Notes (Signed)
 After going over DC paper work pt comes to [employer] station asking about a red spot on throat. Looks as if something scratched pt in up/down motion, about the size of a quarter. Also couple of marks along throat. Pt stating feeling SOB when walking, otherwise ok. Dr Nelson Chimes at bedside to check out pt.

## 2024-02-10 NOTE — Discharge Instructions (Addendum)
Return to the ER for worsening symptoms, persistent vomiting, difficulty breathing or other concerns. °

## 2024-02-10 NOTE — ED Notes (Signed)
 Pt asked to sit for 10 min to see if this gets worse. Benadryl given, pt informed to let this RN know if anything gets worse

## 2024-02-10 NOTE — Assessment & Plan Note (Signed)
-   We will place the patient on PPI therapy.

## 2024-02-10 NOTE — Progress Notes (Signed)
 Anticoagulation monitoring(Lovenox):  38 yo female ordered Lovenox 40 mg Q24h    Filed Weights   02/09/24 2006  Weight: 83 kg (183 lb)   BMI 31.4    Lab Results  Component Value Date   CREATININE 0.59 02/09/2024   CREATININE 0.65 02/10/2023   CREATININE 0.56 02/09/2023   Estimated Creatinine Clearance: 102.3 mL/min (by C-G formula based on SCr of 0.59 mg/dL). Hemoglobin & Hematocrit     Component Value Date/Time   HGB 14.4 02/09/2024 2035   HGB 14.5 12/22/2012 1720   HCT 40.5 02/09/2024 2035   HCT 41.3 12/22/2012 1720     Per Protocol for Patient with estCrcl > 30 ml/min and BMI > 30, will transition to Lovenox 42.5 mg Q24h.

## 2024-02-10 NOTE — ED Notes (Signed)
Dietary called for pt lunch tray.

## 2024-02-10 NOTE — Assessment & Plan Note (Signed)
-   We will continue as needed Atarax.

## 2024-02-10 NOTE — Assessment & Plan Note (Addendum)
-   The patient will be admitted to an observation cardiac telemetry bed. - Will follow serial troponins and EKGs. - The patient will be placed on aspirin as well as p.r.n. sublingual nitroglycerin and morphine sulfate for pain. - We will obtain a cardiology consult in a.m. for further cardiac risk stratification. - I notified Dr. Corky Sing about the patient.

## 2024-02-10 NOTE — ED Notes (Signed)
 Per Dr Nelson Chimes, scratch marks were there on her first exam. Will give pt some Benadryl.

## 2024-02-10 NOTE — Discharge Summary (Signed)
 Physician Discharge Summary   Patient: Denise Bryan MRN: [id number] DOB: 1986/11/03  Admit date:     02/10/2024  Discharge date: 02/10/24  Discharge Physician: Arnetha Courser   PCP: [facility]   Recommendations at discharge:  Follow-up with primary care provider  Discharge Diagnoses: Principal Problem:   Chest pain Active Problems:   GERD without esophagitis   History of brain cancer   Anxiety   Hospital Course: Taken from H&P  [name] is a 38 y.o. female with medical history significant for anxiety, brain cancer status post radiotherapy, GERD, migraine headache and neuromuscular disorder, who presented to the emergency room with acute onset of chest pain which has been constant over the last couple of days with associated nausea without vomiting as well as dyspnea.  Her chest pain has been radiating to her epigastric area.   On presentation stable vital, labs with metabolic acidosis, CO2 of 18 otherwise mostly unremarkable.  Negative troponin x 2.  Negative D-dimer will EKG with NSR, no acute changes Chest x-ray negative for any acute abnormality.  3/25: Hemodynamically stable.  Patient underwent stress testing which was normal.  Patient was having nonspecific chest pain.  ACS ruled out. Patient with very nonspecific aches and pains and keeps saying that I am swollen everywhere but on exam there was no swelling.  She had pretty extensive rheumatologic workup done recently which was mostly negative except positive ANA which seems nonspecific.  She was keeps saying that I know my body and something is wrong and if you do not diagnose properly then be ready for a big lawsuit, my mom is a Clinical research associate.  Per patient she has seen all the specialist and nobody can figure out what is wrong with her.  Patient likely will get benefit from cognitive behavioral therapy.  She was given a dose of Toradol and repeat BMP with resolution of metabolic acidosis  which was very nonspecific.  Patient should continue her home medications and need to have a follow-up with her outpatient providers for further evaluation.  Assessment and Plan: * Chest pain Patient is very nonspecific chest pain.  Complaining of generalized aches and pain and keeps jumping from one area to the other.  Underwent nuclear medicine stress testing and it was negative for any ischemia.  Cardiology cleared her for discharge.  Troponin remain negative and EKG normal. ACS ruled out  GERD without esophagitis - We will place the patient on PPI therapy.  History of brain cancer - We will continue pain management.  Anxiety - We will continue as needed Atarax.   Consultants: Cardiology Procedures performed: Stress test Disposition: Home Diet recommendation:  Discharge Diet Orders (From admission, onward)     Start     Ordered   02/10/24 0000  Diet - low sodium heart healthy        02/10/24 1630           Regular diet DISCHARGE MEDICATION: Allergies as of 02/10/2024       Reactions   Eicosapentaenoic Acid (epa) (fish) Anaphylaxis   Fish-derived Products Anaphylaxis   Iodine Anaphylaxis   Tramadol Anaphylaxis, Hives        Medication List     STOP taking these medications    ibuprofen 800 MG tablet Commonly known as: ADVIL   ketoconazole 2 % shampoo Commonly known as: NIZORAL   lidocaine 2 % solution Commonly known as: XYLOCAINE   naproxen 500 MG tablet Commonly known as: NAPROSYN  TAKE these medications    hydrOXYzine 10 MG tablet Commonly known as: ATARAX Take 1 tablet (10 mg total) by mouth 3 (three) times daily as needed for anxiety.   oxyCODONE 5 MG immediate release tablet Commonly known as: Roxicodone Take 1 tablet (5 mg total) by mouth every 6 (six) hours as needed.   torsemide 20 MG tablet Commonly known as: DEMADEX Take 1 tablet (20 mg total) by mouth daily.        Follow-up Information     Paraschos, Alexander,  MD. Schedule an appointment as soon as possible for a visit in 3 days.   Specialty: Cardiology Contact information: [address] [facility] [place] [address] [phone]         [facility]. Schedule an appointment as soon as possible for a visit in 1 week(s).   Specialty: General Practice Contact information: [address] [place] [address] [phone]                Discharge Exam: Ceasar Mons Weights   02/09/24 2006  Weight: 83 kg   General.  Well-developed lady, in no acute distress. Pulmonary.  Lungs clear bilaterally, normal respiratory effort. CV.  Regular rate and rhythm, no JVD, rub or murmur. Abdomen.  Soft, nontender, nondistended, BS positive. CNS.  Alert and oriented .  No focal neurologic deficit. Extremities.  No edema, no cyanosis, pulses intact and symmetrical. Psychiatry.  Judgment and insight appears normal.   Condition at discharge: stable  The results of significant diagnostics from this hospitalization (including imaging, microbiology, ancillary and laboratory) are listed below for reference.   Imaging Studies: NM Myocar Multi W/Spect W/Wall Motion / EF Result Date: 02/10/2024   The study is normal. The study is low risk.   No ST deviation was noted.   LV perfusion is normal.   Left ventricular function is normal. End diastolic cavity size is normal.   DG Chest 2 View Result Date: 02/09/2024 CLINICAL DATA:  Chest pain for 2 days. Shortness of breath and nausea. Epigastric pain. EXAM: CHEST - 2 VIEW COMPARISON:  02/10/2023 FINDINGS: Normal heart size and pulmonary vascularity. No focal airspace disease or consolidation in the lungs. No blunting of costophrenic angles. No pneumothorax. Mediastinal contours appear intact. Postoperative changes in the cervical spine. Surgical clips in the right upper quadrant. IMPRESSION: No active cardiopulmonary disease. Electronically Signed    By: Burman Nieves M.D.   On: 02/09/2024 21:24    Microbiology: Results for orders placed or performed during the hospital encounter of 10/09/20  SARS CORONAVIRUS 2 (TAT 6-24 HRS) Nasopharyngeal Nasopharyngeal Swab     Status: None   Collection Time: 10/09/20  9:20 AM   Specimen: Nasopharyngeal Swab  Result Value Ref Range Status   SARS Coronavirus 2 NEGATIVE NEGATIVE Final    Comment: (NOTE) SARS-CoV-2 target nucleic acids are NOT DETECTED.  The SARS-CoV-2 RNA is generally detectable in upper and lower respiratory specimens during the acute phase of infection. Negative results do not preclude SARS-CoV-2 infection, do not rule out co-infections with other pathogens, and should not be used as the sole basis for treatment or other patient management decisions. Negative results must be combined with clinical observations, patient history, and epidemiological information. The expected result is Negative.  Fact Sheet for Patients: HairSlick.no  Fact Sheet for Healthcare Providers: quierodirigir.com  This test is not yet approved or cleared by the [place] FDA and  has been authorized for detection and/or diagnosis  of SARS-CoV-2 by FDA under an Emergency Use Authorization (EUA). This EUA will remain  in effect (meaning this test can be used) for the duration of the COVID-19 declaration under Se ction 564(b)(1) of the Act, 21 U.S.C. section 360bbb-3(b)(1), unless the authorization is terminated or revoked sooner.  Performed at [facility], [address] [place], [place] [address]     Labs: CBC: Recent Labs  Lab 02/09/24 2035  WBC 6.6  HGB 14.4  HCT 40.5  MCV 91.8  PLT 282   Basic Metabolic Panel: Recent Labs  Lab 02/09/24 2035 02/10/24 1527  NA 135 137  K 4.0 3.6  CL 104 103  CO2 18* 24  GLUCOSE 118* 107*  BUN 12 12  CREATININE 0.59 0.64  CALCIUM 9.2 9.1   Liver Function Tests: Recent Labs   Lab 02/09/24 2317  AST 21  ALT 31  ALKPHOS 77  BILITOT 0.5  PROT 7.4  ALBUMIN 4.2   CBG: No results for input(s): "GLUCAP" in the last 168 hours.  Discharge time spent: greater than 30 minutes.  This record has been created using Conservation officer, historic buildings. Errors have been sought and corrected,but may not always be located. Such creation errors do not reflect on the standard of care.   Signed: Arnetha Courser, MD [facility] 02/10/2024

## 2024-02-10 NOTE — Consult Note (Addendum)
 [facility] Cardiology  CARDIOLOGY CONSULT NOTE  Patient ID: Denise Bryan MRN: [id number] DOB/AGE: [birth year] 38 y.o.  Admit date: 02/10/2024 Referring Physician Dr. Arville Care Reason for Consultation chest pain  HPI: 38 year old female on whom our service is consulted for chest pain.  Patient details that in last 2 days she has left-sided chest discomfort which she describes as tightness/sharp pain.  No relation with activity.  Pain was initially coming and going but in last day it has been persistently there.  Has mild exertional dyspnea.  No palpitation, dizziness or syncope.  EKG showed sinus rhythm without significant ischemic changes.  Troponin x 2 negative.  D-dimer negative.  Do not have any clear acid reflux symptoms.  Echo last year was normal.  Currently patient details her discomfort is little better but still persistently there.  Family history of MI in grandmother.  Not a smoker.  Review of systems complete and found to be negative unless listed above     Past Medical History:  Diagnosis Date   Anxiety    Brain cancer (HCC)    Dyspnea    Endometriosis    Family history of ovarian cancer    Gastroesophageal reflux disease 09/20/2008   no problems since gallbladder taken out!! 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HCC)    Neuromuscular disorder (HCC)    Spontaneous abortion    Unspecified blood type, rh negative     Past Surgical History:  Procedure Laterality Date   BRAIN TUMOR EXCISION     CHOLECYSTECTOMY  2007   CYSTOSCOPY N/A 09/01/2018   Procedure: CYSTOSCOPY;  Surgeon: Conard Novak, MD;  Location: [facility] OR[facility];  Service: Gynecology;  Laterality: N/A;   DIAGNOSTIC LAPAROSCOPY  04/26/2010   IUD EXPULSION/PELVIC PAIN   DILATION AND CURETTAGE OF UTERUS  08/10/10; 08/06/07   HYDATIIDIFORM MOLE/POC, POC    DIRECT LARYNGOSCOPY N/A 10/10/2020   Procedure: INJECTION LARYNGOPLASTY C VOICE GEL, LEFT ONE VOCAL FOLD;  Surgeon: Bud Face, MD;  Location: [facility] ORS;  Service: ENT;  Laterality: N/A;   gallstones  08/05/2006   HYSTEROSCOPY  04/26/2010   IUD EXPULSION /PELVIC PAIN   LAPAROSCOPIC HYSTERECTOMY N/A 09/01/2018   Procedure: HYSTERECTOMY TOTAL LAPAROSCOPIC BILATERAL SALPINGECTOMY;  Surgeon: Conard Novak, MD;  Location: [facility] OR[facility];  Service: Gynecology;  Laterality: N/A;   LAPAROSCOPY N/A 08/21/2017   Procedure: LAPAROSCOPY DIAGNOSTIC;  Surgeon: Conard Novak, MD;  Location: [facility] OR[facility];  Service: Gynecology;  Laterality: N/A;   LAPAROSCOPY N/A 07/30/2018   Procedure: LAPAROSCOPY DIAGNOSTIC WITH BILATERAL SALPINGECTOMY;  Surgeon: Conard Novak, MD;  Location: [facility] OR[facility];  Service: Gynecology;  Laterality: N/A;   SPINAL FUSION  2021   TUBAL LIGATION  01/2012   SDJ    (Not in a hospital admission)  Social History   Socioeconomic History   Marital status: Single    Spouse name: Not on file   Number of children: 3   Years of education: 12   Highest education level: Not on file  Occupational History   Occupation: hostess    Comment: works at [employer]  Tobacco Use   Smoking status: Never   Smokeless tobacco: Never  Vaping Use   Vaping status: Never Used  Substance and Sexual Activity   Alcohol use: No   Drug use: No   Sexual  activity: Not Currently    Birth control/protection: Surgical    Comment: Hysterectomy  Other Topics Concern   Not on file  Social History Narrative   Not on file   Social Drivers of Health   Financial Resource Strain: High Risk (03/02/2023)   Received from [facility], [facility]   Overall Financial Resource Strain (CARDIA)    Difficulty of Paying Living Expenses: Very hard  Food Insecurity: Food Insecurity Present (03/02/2023)   [facility], [facility]    Hunger Vital Sign    Worried About Running Out of Food in the Last Year: Often true    Ran Out of Food in the Last Year: Often true  Transportation Needs: Unmet Transportation Needs (03/02/2023)   Received from [facility], [facility]   [facility] - Transportation    In the past 12 months, has lack of transportation kept you from medical appointments or from getting medications?: Yes    Lack of Transportation (Non-Medical): No  Physical Activity: Not on file  Stress: Not on file  Social Connections: Not on file  Intimate Partner Violence: Not At Risk (02/09/2023)   Humiliation, Afraid, Rape, and Kick questionnaire    Fear of Current or Ex-Partner: No    Emotionally Abused: No    Physically Abused: No    Sexually Abused: No    Family History  Problem Relation Age of Onset   Diabetes Mother    Hypertension Mother    Cancer Mother 51       UTERINE   Colon cancer Mother    Cancer Sister 20       CX   Diabetes Maternal Uncle    Diabetes Maternal Grandmother    Hypertension Maternal Grandmother    Lung disease Maternal Grandfather    Cancer Maternal Grandfather        LUNG   Heart disease Paternal Grandmother    Hypertension Paternal Grandmother       Review of systems complete and found to be negative unless listed above      PHYSICAL EXAM  General: Well developed, well nourished, in no acute distress HEENT:  Normocephalic and atramatic Neck:  No JVD.  Lungs: Clear bilaterally to auscultation and percussion. Heart: HRRR . Normal S1 and S2 without gallops or murmurs.  Not clearly reproducible on palpation. Abdomen: Bowel sounds are positive, abdomen soft and non-tender  Msk:  Back normal, normal gait. Normal strength and tone for age. Extremities: No clubbing, cyanosis or edema.   Neuro: Alert and oriented X 3. Psych:  Good affect, responds appropriately  Labs:   Lab Results  Component Value Date   WBC 6.6 02/09/2024   HGB 14.4  02/09/2024   HCT 40.5 02/09/2024   MCV 91.8 02/09/2024   PLT 282 02/09/2024    Recent Labs  Lab 02/09/24 2035 02/09/24 2317  NA 135  --   K 4.0  --   CL 104  --   CO2 18*  --   BUN 12  --   CREATININE 0.59  --   CALCIUM 9.2  --   PROT  --  7.4  BILITOT  --  0.5  ALKPHOS  --  77  ALT  --  31  AST  --  21  GLUCOSE 118*  --    No results found for: "CKTOTAL", "CKMB", "CKMBINDEX", "TROPONINI" No results found for: "CHOL" No results found for: "HDL" No  results found for: "LDLCALC" No results found for: "TRIG" No results found for: "CHOLHDL" No results found for: "LDLDIRECT"    Radiology: DG Chest 2 View Result Date: 02/09/2024 CLINICAL DATA:  Chest pain for 2 days. Shortness of breath and nausea. Epigastric pain. EXAM: CHEST - 2 VIEW COMPARISON:  02/10/2023 FINDINGS: Normal heart size and pulmonary vascularity. No focal airspace disease or consolidation in the lungs. No blunting of costophrenic angles. No pneumothorax. Mediastinal contours appear intact. Postoperative changes in the cervical spine. Surgical clips in the right upper quadrant. IMPRESSION: No active cardiopulmonary disease. Electronically Signed   By: Burman Nieves M.D.   On: 02/09/2024 21:24    ASSESSMENT AND PLAN:  Atypical chest discomfort Family history of CAD History of brain cancer  Evaluation so far negative with EKG and troponin.  D-dimer normal. Will proceed with nuclear stress test for further evaluation, to look for any significant ischemia. Other possible etiology of her symptoms can include GERD, although not typical Continue current medical therapy  Signed: Kathryne Gin MD 02/10/2024, 10:46 AM

## 2024-02-10 NOTE — ED Notes (Signed)
 Pt feeling the same, will DC at this time. Pt advised if anything gets worse to return to ER.

## 2024-02-10 NOTE — Assessment & Plan Note (Signed)
-   We will continue pain management.

## 2024-02-10 NOTE — ED Provider Notes (Signed)
 [facility] Provider Note    Event Date/Time   First MD Initiated Contact with Patient 02/10/24 0304     (approximate)   History   Chest Pain   HPI  Denise Bryan is a 38 y.o. female who presents to the ED from home with a chief complaint of constant central chest pain x 2 days, associated with nausea and shortness of breath.  Patient reports upper chest pain radiating towards her epigastrium.  Denies associated diaphoresis, palpitations, vomiting or dizziness.  History of brain cancer status post radiation.  Currently being worked up by rheumatology for polyarthralgia and extremity swelling.  History of similar chest admitted last year, cleared by cardiology.     Past Medical History   Past Medical History:  Diagnosis Date   Anxiety    Brain cancer (HCC)    Dyspnea    Endometriosis    Family history of ovarian cancer    Gastroesophageal reflux disease 09/20/2008   no problems since gallbladder taken out!! 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   Dyspnea 02/08/2023   Status post laparoscopic hysterectomy 09/01/2018   Urinary tract infection 08/21/2017   Endometriosis 07/02/2017   Pelvic pain 06/23/2017   Dyspareunia, female 06/23/2017   Menorrhagia with irregular cycle 06/23/2017   Genital herpes simplex 01/02/2015   Neck sprain 04/02/2012   Acne vulgaris 06/14/2009   Headache  06/14/2009   Gastroesophageal reflux disease 09/20/2008     Past Surgical History   Past Surgical History:  Procedure Laterality Date   BRAIN TUMOR EXCISION     CHOLECYSTECTOMY  2007   CYSTOSCOPY N/A 09/01/2018   Procedure: CYSTOSCOPY;  Surgeon: Conard Novak, MD;  Location: [facility];  Service: Gynecology;  Laterality: N/A;   DIAGNOSTIC LAPAROSCOPY  04/26/2010   IUD EXPULSION/PELVIC PAIN   DILATION AND CURETTAGE OF UTERUS  08/10/10; 08/06/07   HYDATIIDIFORM MOLE/POC, POC   DIRECT LARYNGOSCOPY N/A 10/10/2020   Procedure: INJECTION LARYNGOPLASTY C VOICE GEL, LEFT ONE VOCAL FOLD;  Surgeon: Bud Face, MD;  Location: [facility];  Service: ENT;  Laterality: N/A;   gallstones  08/05/2006   HYSTEROSCOPY  04/26/2010   IUD EXPULSION /PELVIC PAIN   LAPAROSCOPIC HYSTERECTOMY N/A 09/01/2018   Procedure: HYSTERECTOMY TOTAL LAPAROSCOPIC BILATERAL SALPINGECTOMY;  Surgeon: Conard Novak, MD;  Location: [facility];  Service: Gynecology;  Laterality: N/A;   LAPAROSCOPY N/A 08/21/2017   Procedure: LAPAROSCOPY DIAGNOSTIC;  Surgeon: Conard Novak, MD;  Location: [facility];  Service: Gynecology;  Laterality: N/A;   LAPAROSCOPY N/A 07/30/2018   Procedure: LAPAROSCOPY DIAGNOSTIC WITH BILATERAL SALPINGECTOMY;  Surgeon: Conard Novak, MD;  Location: [facility];  Service: Gynecology;  Laterality: N/A;   SPINAL FUSION  2021   TUBAL LIGATION  01/2012   SDJ     Home Medications   Prior to Admission medications   Medication  Sig Start Date End Date Taking? Authorizing Provider  hydrOXYzine (ATARAX) 10 MG tablet Take 1 tablet (10 mg total) by mouth 3 (three) times daily as needed for anxiety. Patient not taking: Reported on 02/20/2023 02/11/23   Kathlen Mody, MD  ibuprofen (ADVIL) 800 MG tablet Take by mouth.    [provider]  ketoconazole (NIZORAL) 2 % shampoo Apply 1 Application topically 2 (two) times a week. 01/22/23   [provider]  lidocaine (XYLOCAINE) 2 % solution  Use as directed 15 mLs in the mouth or throat as needed for mouth pain. 10/25/23   Garlon Hatchet, PA-C  naproxen (NAPROSYN) 500 MG tablet Take 1 tablet (500 mg total) by mouth 2 (two) times daily. 10/25/23   Garlon Hatchet, PA-C  oxyCODONE (ROXICODONE) 5 MG immediate release tablet Take 1 tablet (5 mg total) by mouth every 6 (six) hours as needed. 07/24/23 07/23/24  Evon Slack, PA-C  torsemide (DEMADEX) 20 MG tablet Take 1 tablet (20 mg total) by mouth daily. 02/11/23 02/11/24  Kathlen Mody, MD     Allergies  Eicosapentaenoic acid (epa) (fish), Fish-derived products, Iodine, and Tramadol   Family History   Family History  Problem Relation Age of Onset   Diabetes Mother    Hypertension Mother    Cancer Mother 85       UTERINE   Colon cancer Mother    Cancer Sister 5       CX   Diabetes Maternal Uncle    Diabetes Maternal Grandmother    Hypertension Maternal Grandmother    Lung disease Maternal Grandfather    Cancer Maternal Grandfather        LUNG   Heart disease Paternal Grandmother    Hypertension Paternal Grandmother      Physical Exam  Triage Vital Signs: ED Triage Vitals  Encounter Vitals Group     BP 02/09/24 2012 127/76     Systolic BP Percentile --      Diastolic BP Percentile --      Pulse Rate 02/09/24 2012 82     Resp 02/09/24 2012 19     Temp 02/09/24 2012 98.9 F (37.2 C)     Temp Source 02/09/24 2012 Oral     SpO2 02/09/24 2012 96 %     Weight 02/09/24 2006 183 lb (83 kg)     Height 02/09/24 2006 5\' 4"  (1.626 m)     Head Circumference --      Peak Flow --      Pain Score 02/09/24 2006 9     Pain Loc --      Pain Education --      Exclude from Growth Chart --     Updated Vital Signs: BP 101/69   Pulse 70   Temp 98.2 F (36.8 C) (Oral)   Resp 11   Ht 5\' 4"  (1.626 m)   Wt 83 kg   LMP 08/18/2018 Comment: preg test negative  SpO2 99%   BMI 31.41 kg/m    General: Awake, no distress.  CV:  RRR.  Good peripheral perfusion.  Resp:  Normal  effort.  CTAB.  Anterior chest tender to palpation. Abd:  Mild epigastric tenderness to palpation without rebound or guarding.  No distention.  Other:  No pedal edema.  Bilateral calves are supple and nontender.   ED Results / Procedures / Treatments  Labs (all labs ordered are listed, but only abnormal results are displayed) Labs Reviewed  BASIC METABOLIC PANEL -  Abnormal; Notable for the following components:      Result Value   CO2 18 (*)    Glucose, Bld 118 (*)    All other components within normal limits  CBC  HEPATIC FUNCTION PANEL  LIPASE, BLOOD  D-DIMER, QUANTITATIVE  HIV ANTIBODY (ROUTINE TESTING W REFLEX)  TROPONIN I (HIGH SENSITIVITY)  TROPONIN I (HIGH SENSITIVITY)     EKG  ED ECG REPORT I, Favor Hackler J, the attending physician, personally viewed and interpreted this ECG.   Date: 02/10/2024  EKG Time: 2009  Rate: 93  Rhythm: normal sinus rhythm  Axis: Normal  Intervals:none  ST&T Change: Nonspecific    RADIOLOGY I have independently visualized and interpreted patient's imaging studies as well as noted the radiology interpretation:  Chest x-ray: No acute cardiopulmonary process  Official radiology report(s): DG Chest 2 View Result Date: 02/09/2024 CLINICAL DATA:  Chest pain for 2 days. Shortness of breath and nausea. Epigastric pain. EXAM: CHEST - 2 VIEW COMPARISON:  02/10/2023 FINDINGS: Normal heart size and pulmonary vascularity. No focal airspace disease or consolidation in the lungs. No blunting of costophrenic angles. No pneumothorax. Mediastinal contours appear intact. Postoperative changes in the cervical spine. Surgical clips in the right upper quadrant. IMPRESSION: No active cardiopulmonary disease. Electronically Signed   By: Burman Nieves M.D.   On: 02/09/2024 21:24     PROCEDURES:  Critical Care performed: No  .1-3 Lead EKG Interpretation  Performed by: Irean Hong, MD Authorized by: Irean Hong, MD     Interpretation: normal      ECG rate:  85   ECG rate assessment: normal     Rhythm: sinus rhythm     Ectopy: none     Conduction: normal   Comments:     Patient placed on cardiac monitor to evaluate for arrhythmias    MEDICATIONS ORDERED IN ED: Medications  nitroGLYCERIN (NITROGLYN) 2 % ointment 0.5 inch (0.5 inches Topical Not Given 02/10/24 0455)  oxyCODONE (Oxy IR/ROXICODONE) immediate release tablet 5 mg (has no administration in time range)  torsemide (DEMADEX) tablet 20 mg (has no administration in time range)  hydrOXYzine (ATARAX) tablet 10 mg (has no administration in time range)  lidocaine (XYLOCAINE) 2 % viscous mouth solution 15 mL (has no administration in time range)  ketoconazole (NIZORAL) 2 % shampoo 1 Application (has no administration in time range)  acetaminophen (TYLENOL) tablet 650 mg (has no administration in time range)  ondansetron (ZOFRAN) injection 4 mg (has no administration in time range)  enoxaparin (LOVENOX) injection 40 mg (has no administration in time range)  fentaNYL (SUBLIMAZE) injection 25 mcg (has no administration in time range)  nitroGLYCERIN (NITROSTAT) SL tablet 0.4 mg (has no administration in time range)  aspirin EC tablet 81 mg (has no administration in time range)  0.9 %  sodium chloride infusion (has no administration in time range)  ketorolac (TORADOL) 30 MG/ML injection 15 mg (15 mg Intravenous Given 02/10/24 0325)  ondansetron (ZOFRAN) injection 4 mg (4 mg Intravenous Given 02/10/24 0326)  oxyCODONE (Oxy IR/ROXICODONE) immediate release tablet 5 mg (5 mg Oral Given 02/10/24 0318)  famotidine (PEPCID) IVPB 20 mg premix (0 mg Intravenous Stopped 02/10/24 0431)  aspirin chewable tablet 324 mg (324 mg Oral Given 02/10/24 0455)     IMPRESSION / MDM / ASSESSMENT AND PLAN / ED COURSE  I reviewed the triage vital signs and the nursing notes.  38 year old female presenting with chest pain. Differential diagnosis includes, but is not limited to,  ACS, aortic dissection, pulmonary embolism, cardiac tamponade, pneumothorax, pneumonia, pericarditis, myocarditis, GI-related causes including esophagitis/gastritis, and musculoskeletal chest wall pain.   I personally reviewed patient's records and note her hospitalization for leg swelling and DOE from 02/08/2023.  I have also noted her recent rheumatology visit for evaluation of polyarthralgia from 01/15/2024.  Patient's presentation is most consistent with acute complicated illness / injury requiring diagnostic workup.  The patient is on the cardiac monitor to evaluate for evidence of arrhythmia and/or significant heart rate changes.  Laboratory results unremarkable including 2 sets of negative troponins.  Have added hepatic panel and lipase both of which are negative.  Chest x-ray is clear.  Will obtain D-dimer, administer IV ketorolac, oxycodone, Zofran, Pepcid and reassess.  Clinical Course as of 02/10/24 0519  Tue Feb 10, 2024  0518 D-dimer negative.  Patient was uncomfortable with discharge home, states she wants answers for the root cause of her chest pain.  Hospitalist services was consulted and graciously admitted patient to the hospital for chest pain obs. [JS]    Clinical Course User Index [JS] Irean Hong, MD     FINAL CLINICAL IMPRESSION(S) / ED DIAGNOSES   Final diagnoses:  Chest pain, unspecified type     Rx / DC Orders   ED Discharge Orders     None        Note:  This document was prepared using Dragon voice recognition software and may include unintentional dictation errors.   Irean Hong, MD 02/10/24 [phone]

## 2024-03-16 ENCOUNTER — Ambulatory Visit (INDEPENDENT_AMBULATORY_CARE_PROVIDER_SITE_OTHER): Admitting: Nurse Practitioner

## 2024-03-16 VITALS — BP 118/81 | HR 73 | Resp 17 | Ht 64.0 in | Wt 198.0 lb

## 2024-03-16 DIAGNOSIS — I890 Lymphedema, not elsewhere classified: Secondary | ICD-10-CM | POA: Diagnosis not present

## 2024-03-17 ENCOUNTER — Encounter (INDEPENDENT_AMBULATORY_CARE_PROVIDER_SITE_OTHER): Payer: Self-pay | Admitting: Nurse Practitioner

## 2024-03-17 NOTE — Progress Notes (Signed)
 Subjective:    Patient ID: Denise Bryan, female    DOB: 11-26-1985, 38 y.o.   MRN: [id number] Chief Complaint  Patient presents with   Venous Insufficiency    The patient is a 38 year old female who returns today for her lymphedema.  Since her last visit she was referred to the lymphedema clinic but never received an appointment.  In addition she also was supposed to get a lymphedema pump but she notes that she was only given a trial pump and has not had a pump to utilize.  She continues to have lower extremity edema ultra significant and painful for her.  In addition she has swelling in her hands.  There is always swelling in her hands but does not approach any higher.  She notes that she was supposed to have compression socks a little but she has not yet received those.    Review of Systems  Cardiovascular:  Positive for leg swelling.  All other systems reviewed and are negative.      Objective:   Physical Exam Vitals reviewed.  HENT:     Head: Normocephalic.  Cardiovascular:     Rate and Rhythm: Normal rate.  Pulmonary:     Effort: Pulmonary effort is normal.  Musculoskeletal:     Right lower leg: Edema present.     Left lower leg: Edema present.  Skin:    General: Skin is warm and dry.  Neurological:     Mental Status: She is alert and oriented to person, place, and time.  Psychiatric:        Mood and Affect: Mood normal.        Behavior: Behavior normal.        Thought Content: Thought content normal.        Judgment: Judgment normal.     BP 118/81 (BP Location: Left Arm, Patient Position: Sitting, Cuff Size: Normal)   Pulse 73   Resp 17   Ht 5\' 4"  (1.626 m)   Wt 198 lb (89.8 kg)   LMP 08/18/2018 Comment: preg test negative  BMI 33.99 kg/m   Past Medical History:  Diagnosis Date   Anxiety    Brain cancer (HCC)    Dyspnea    Endometriosis    Family history of ovarian cancer    Gastroesophageal reflux disease 09/20/2008   no problems since  gallbladder taken out!! 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HCC)    Neuromuscular disorder (HCC)    Spontaneous abortion    Unspecified blood type, rh negative     Social History   Socioeconomic History   Marital status: Single    Spouse name: Not on file   Number of children: 3   Years of education: 12   Highest education level: Not on file  Occupational History   Occupation: hostess    Comment: works at [employer]  Tobacco Use   Smoking status: Never   Smokeless tobacco: Never  Vaping Use   Vaping status: Never Used  Substance and Sexual Activity   Alcohol use: No   Drug use: No   Sexual activity: Not Currently    Birth control/protection: Surgical    Comment: Hysterectomy  Other Topics Concern   Not on file  Social History Narrative   Not on file  Social Drivers of Health   Financial Resource Strain: Medium Risk (02/11/2024)   Received from [facility]   Overall Financial Resource Strain (CARDIA)    Difficulty of Paying Living Expenses: Somewhat hard  Food Insecurity: Food Insecurity Present (02/11/2024)   Received from [facility]   Hunger Vital Sign    Worried About Running Out of Food in the Last Year: Often true    Ran Out of Food in the Last Year: Often true  Transportation Needs: Unmet Transportation Needs (02/11/2024)   Received from [facility]    In the past 12 months, has lack of transportation kept you from medical appointments or from getting medications?: Yes    Lack of Transportation (Non-Medical): Yes  Physical Activity: Not on file  Stress: Not on file  Social Connections: Unknown (02/10/2024)   Social Connection and Isolation Panel [NHANES]    Frequency of Communication with Friends and Family: More than  three times a week    Frequency of Social Gatherings with Friends and Family: More than three times a week    Attends Religious Services: 1 to 4 times per year    Active Member of [employer] or Organizations: No    Attends Banker Meetings: Never    Marital Status: Patient declined  Catering manager Violence: Not At Risk (02/10/2024)   Humiliation, Afraid, Rape, and Kick questionnaire    Fear of Current or Ex-Partner: No    Emotionally Abused: No    Physically Abused: No    Sexually Abused: No    Past Surgical History:  Procedure Laterality Date   BRAIN TUMOR EXCISION     CHOLECYSTECTOMY  2007   CYSTOSCOPY N/A 09/01/2018   Procedure: CYSTOSCOPY;  Surgeon: Kris Pester, MD;  Location: [facility];  Service: Gynecology;  Laterality: N/A;   DIAGNOSTIC LAPAROSCOPY  04/26/2010   IUD EXPULSION/PELVIC PAIN   DILATION AND CURETTAGE OF UTERUS  08/10/10; 08/06/07   HYDATIIDIFORM MOLE/POC, POC   DIRECT LARYNGOSCOPY N/A 10/10/2020   Procedure: INJECTION LARYNGOPLASTY C VOICE GEL, LEFT ONE VOCAL FOLD;  Surgeon: Rogers Clayman, MD;  Location: [facility];  Service: ENT;  Laterality: N/A;   gallstones  08/05/2006   HYSTEROSCOPY  04/26/2010   IUD EXPULSION /PELVIC PAIN   LAPAROSCOPIC HYSTERECTOMY N/A 09/01/2018   Procedure: HYSTERECTOMY TOTAL LAPAROSCOPIC BILATERAL SALPINGECTOMY;  Surgeon: Kris Pester, MD;  Location: [facility];  Service: Gynecology;  Laterality: N/A;   LAPAROSCOPY N/A 08/21/2017   Procedure: LAPAROSCOPY DIAGNOSTIC;  Surgeon: Kris Pester, MD;  Location: [facility];  Service: Gynecology;  Laterality: N/A;   LAPAROSCOPY N/A 07/30/2018   Procedure: LAPAROSCOPY DIAGNOSTIC WITH BILATERAL SALPINGECTOMY;  Surgeon: Kris Pester, MD;  Location: [facility];  Service: Gynecology;  Laterality: N/A;   SPINAL FUSION  2021   TUBAL LIGATION  01/2012   SDJ    Family History  Problem Relation Age of Onset   Diabetes Mother    Hypertension Mother    Cancer Mother 54        UTERINE   Colon cancer Mother    Cancer Sister 51       CX   Diabetes Maternal Uncle    Diabetes Maternal Grandmother    Hypertension Maternal Grandmother    Lung disease Maternal Grandfather    Cancer Maternal Grandfather        LUNG   Heart disease Paternal Grandmother    Hypertension Paternal Grandmother  Allergies  Allergen Reactions   Eicosapentaenoic Acid (Epa) (Fish) Anaphylaxis   Fish-Derived Products Anaphylaxis   Iodine Anaphylaxis   Tramadol Anaphylaxis and Hives       Latest Ref Rng & Units 02/09/2024    8:35 PM 02/09/2023    6:41 AM 02/08/2023   11:57 PM  CBC  WBC 4.0 - 10.5 K/uL 6.6  5.1  7.0   Hemoglobin 12.0 - 15.0 g/dL 56.2  13.0  86.5   Hematocrit 36.0 - 46.0 % 40.5  35.8  38.0   Platelets 150 - 400 K/uL 282  252  264       CMP     Component Value Date/Time   NA 137 02/10/2024 1527   NA 137 12/22/2012 1720   K 3.6 02/10/2024 1527   K 4.1 12/22/2012 1720   CL 103 02/10/2024 1527   CL 105 12/22/2012 1720   CO2 24 02/10/2024 1527   CO2 27 12/22/2012 1720   GLUCOSE 107 (H) 02/10/2024 1527   GLUCOSE 87 12/22/2012 1720   BUN 12 02/10/2024 1527   BUN 10 12/22/2012 1720   CREATININE 0.64 02/10/2024 1527   CREATININE 0.52 (L) 12/22/2012 1720   CALCIUM  9.1 02/10/2024 1527   CALCIUM  9.1 12/22/2012 1720   PROT 7.4 02/09/2024 2317   PROT 8.0 12/22/2012 1720   ALBUMIN  4.2 02/09/2024 2317   ALBUMIN  4.5 12/22/2012 1720   AST 21 02/09/2024 2317   AST 24 12/22/2012 1720   ALT 31 02/09/2024 2317   ALT 30 12/22/2012 1720   ALKPHOS 77 02/09/2024 2317   ALKPHOS 105 12/22/2012 1720   BILITOT 0.5 02/09/2024 2317   BILITOT 0.7 12/22/2012 1720   GFRNONAA >60 02/10/2024 1527   GFRNONAA >60 12/22/2012 1720     No results found.     Assessment & Plan:   1. Lymphedema (Primary) The patient has had some difficulties with beginning conservative therapy.  There are some confusion regarding compression socks and those being delivered to her which  unfortunately is not a service that we offer.  I have given her a prescription today so that she can try to obtain them either via [employer] or [employer].  In addition she should continue with elevation of her lower extremities.  She should also attempt to walk at least 3 to 4 days/week to help with her swelling as well.  Will refer the patient to lymphedema clinic for treatment.  In addition we will reach out to lymphedema pump able to see what the issue may have been.  She will return in 6 months to evaluate progress with edema   Current Outpatient Medications on File Prior to Visit  Medication Sig Dispense Refill   naloxone (NARCAN) nasal spray 4 mg/0.1 mL Place 4 mg into the nose.     oxyCODONE  (ROXICODONE ) 5 MG immediate release tablet Take 1 tablet (5 mg total) by mouth every 6 (six) hours as needed. 15 tablet 0   WEGOVY 0.25 MG/0.5ML SOAJ      hydrOXYzine  (ATARAX ) 10 MG tablet Take 1 tablet (10 mg total) by mouth 3 (three) times daily as needed for anxiety. (Patient not taking: Reported on 02/20/2023) 30 tablet 0   torsemide  (DEMADEX ) 20 MG tablet Take 1 tablet (20 mg total) by mouth daily. (Patient not taking: Reported on 02/10/2024) 30 tablet 11   No current facility-administered medications on file prior to visit.    There are no Patient Instructions on file for this visit. No follow-ups on file.  Janaysha Depaulo E Ginny Loomer, NP

## 2024-04-06 ENCOUNTER — Encounter (INDEPENDENT_AMBULATORY_CARE_PROVIDER_SITE_OTHER): Payer: Self-pay

## 2024-05-24 ENCOUNTER — Emergency Department
Admission: EM | Admit: 2024-05-24 | Discharge: 2024-05-24 | Disposition: A | Discharge status: Home / Self Care | Attending: Emergency Medicine | Admitting: Emergency Medicine

## 2024-05-24 ENCOUNTER — Other Ambulatory Visit: Payer: Self-pay

## 2024-05-24 ENCOUNTER — Emergency Department

## 2024-05-24 DIAGNOSIS — R202 Paresthesia of skin: Secondary | ICD-10-CM | POA: Diagnosis not present

## 2024-05-24 DIAGNOSIS — R200 Anesthesia of skin: Secondary | ICD-10-CM | POA: Diagnosis not present

## 2024-05-24 DIAGNOSIS — M25522 Pain in left elbow: Secondary | ICD-10-CM | POA: Insufficient documentation

## 2024-05-24 MED ORDER — KETOROLAC TROMETHAMINE 30 MG/ML IJ SOLN
30.0000 mg | Freq: Once | INTRAMUSCULAR | Status: AC
  Administered 2024-05-24: 30 mg via INTRAMUSCULAR
  Filled 2024-05-24: qty 1

## 2024-05-24 NOTE — ED Provider Notes (Signed)
 [facility] Provider Note    Event Date/Time   First MD Initiated Contact with Patient 05/24/24 2211     (approximate)   History   Chief Complaint Elbow Pain   HPI  Denise Bryan is a 38 y.o. female with past medical history of endometriosis and GERD who presents to the ED complaining of elbow pain.  Patient reports that she has had 1 week of increasing pain around her left elbow.  She does not recall any traumatic injury, states she is right-handed and does not use her left arm much at work.  She has noticed a small amount of swelling around her antecubital fossa, denies any redness to this area or any fevers.  She does report some numbness and tingling in her left forearm and hand, has not noticed any weakness.  She was seen 1 week ago for the symptoms at [facility], was prescribed a course of steroids without improvement.     Physical Exam   Triage Vital Signs: ED Triage Vitals  Encounter Vitals Group     BP 05/24/24 2101 120/85     Girls Systolic BP Percentile --      Girls Diastolic BP Percentile --      Boys Systolic BP Percentile --      Boys Diastolic BP Percentile --      Pulse Rate 05/24/24 2101 74     Resp 05/24/24 2101 15     Temp 05/24/24 2101 98 F (36.7 C)     Temp src --      SpO2 05/24/24 2101 97 %     Weight --      Height 05/24/24 2059 5' 4 (1.626 m)     Head Circumference --      Peak Flow --      Pain Score 05/24/24 2059 10     Pain Loc --      Pain Education --      Exclude from Growth Chart --     Most recent vital signs: Vitals:   05/24/24 2101  BP: 120/85  Pulse: 74  Resp: 15  Temp: 98 F (36.7 C)  SpO2: 97%    Constitutional: Alert and oriented. Eyes: Conjunctivae are normal. Head: Atraumatic. Nose: No congestion/rhinnorhea. Mouth/Throat: Mucous membranes are moist.  Cardiovascular: Normal rate, regular rhythm. Grossly normal heart sounds.  2+ radial pulses bilaterally. Respiratory: Normal  respiratory effort.  No retractions. Lungs CTAB. Gastrointestinal: Soft and nontender. No distention. Musculoskeletal: No lower extremity tenderness nor edema.  Diffuse tenderness to palpation of left elbow with no obvious deformity, no significant edema or erythema noted.  Range of motion intact. Neurologic:  Normal speech and language. No gross focal neurologic deficits are appreciated.    ED Results / Procedures / Treatments   Labs (all labs ordered are listed, but only abnormal results are displayed) Labs Reviewed - No data to display   RADIOLOGY Left elbow x-ray reviewed and interpreted by me with no fracture or dislocation.  PROCEDURES:  Critical Care performed: No  Procedures   MEDICATIONS ORDERED IN ED: Medications  ketorolac  (TORADOL ) 30 MG/ML injection 30 mg (30 mg Intramuscular Given 05/24/24 2311)     IMPRESSION / MDM / ASSESSMENT AND PLAN / ED COURSE  I reviewed the triage vital signs and the nursing notes.                              38  y.o. female with past medical history of endometriosis and GERD who presents to the ED complaining of 1 week of increasing left elbow pain.  Patient's presentation is most consistent with acute complicated illness / injury requiring diagnostic workup.  Differential diagnosis includes, but is not limited to, fracture, dislocation, arthritis.  Patient nontoxic-appearing and in no acute distress, vital signs are unremarkable.  X-ray imaging of the left elbow is unremarkable, no findings concerning for infectious process.  She is neurovascularly intact distally, no apparent emergent pathology at this time.  Patient given a dose of IM Toradol  and referral to orthopedics, was counseled to continue NSAIDs and to return to the ED for new or worsening symptoms.  Patient agrees with plan.      FINAL CLINICAL IMPRESSION(S) / ED DIAGNOSES   Final diagnoses:  Left elbow pain     Rx / DC Orders   ED Discharge Orders     None         Note:  This document was prepared using Dragon voice recognition software and may include unintentional dictation errors.   Willo Dunnings, MD 05/24/24 [phone]

## 2024-05-24 NOTE — ED Triage Notes (Signed)
 Pt to ED via POV c/o left elbow pain x2-3 wks. No known injury. Pt went to [facility] about a week and a half ago and was prescribed medicine. Unknown what medicine was

## 2024-06-16 ENCOUNTER — Emergency Department

## 2024-06-16 ENCOUNTER — Other Ambulatory Visit: Payer: Self-pay

## 2024-06-16 DIAGNOSIS — S9001XA Contusion of right ankle, initial encounter: Secondary | ICD-10-CM | POA: Insufficient documentation

## 2024-06-16 DIAGNOSIS — M79604 Pain in right leg: Secondary | ICD-10-CM | POA: Diagnosis not present

## 2024-06-16 DIAGNOSIS — S99911A Unspecified injury of right ankle, initial encounter: Secondary | ICD-10-CM | POA: Diagnosis present

## 2024-06-16 DIAGNOSIS — X58XXXA Exposure to other specified factors, initial encounter: Secondary | ICD-10-CM | POA: Diagnosis not present

## 2024-06-16 DIAGNOSIS — Z85841 Personal history of malignant neoplasm of brain: Secondary | ICD-10-CM | POA: Diagnosis not present

## 2024-06-16 NOTE — ED Triage Notes (Signed)
 Pt reports after the water park today she began having right ankle pain, some swelling noted. Denies known injury.

## 2024-06-17 ENCOUNTER — Emergency Department

## 2024-06-17 ENCOUNTER — Emergency Department
Admission: EM | Admit: 2024-06-17 | Discharge: 2024-06-17 | Disposition: A | Discharge status: Home / Self Care | Attending: Emergency Medicine | Admitting: Emergency Medicine

## 2024-06-17 DIAGNOSIS — M25571 Pain in right ankle and joints of right foot: Secondary | ICD-10-CM

## 2024-06-17 DIAGNOSIS — M79661 Pain in right lower leg: Secondary | ICD-10-CM

## 2024-06-17 MED ORDER — LIDOCAINE 5 % EX PTCH
1.0000 | MEDICATED_PATCH | Freq: Once | CUTANEOUS | Status: DC
  Administered 2024-06-17: 1 via TRANSDERMAL
  Filled 2024-06-17: qty 1

## 2024-06-17 MED ORDER — IBUPROFEN 800 MG PO TABS: 800.0000 mg | ORAL_TABLET | Freq: Three times a day (TID) | ORAL | 0 refills | Status: AC | PRN

## 2024-06-17 MED ORDER — KETOROLAC TROMETHAMINE 30 MG/ML IJ SOLN
60.0000 mg | Freq: Once | INTRAMUSCULAR | Status: AC
  Administered 2024-06-17: 60 mg via INTRAMUSCULAR
  Filled 2024-06-17: qty 2

## 2024-06-17 NOTE — Discharge Instructions (Addendum)
 Please continue your chronic oxycodone  as prescribed and follow-up with orthopedics if symptoms not improving with rest, elevation, ice, anti-inflammatories, compression with an Ace wrap.  Also recommend follow-up with your pain management specialist if you feel like your oxycodone  is not controlling your pain.  Your x-ray showed no fracture or dislocation.  Your ultrasound showed no blood clot.

## 2024-06-17 NOTE — ED Provider Notes (Signed)
 [facility] Provider Note    Event Date/Time   First MD Initiated Contact with Patient 06/17/24 0216     (approximate)   History   Ankle Pain   HPI  Denise Bryan is a 38 y.o. 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  She denies any injury but has noticed swelling and bruising to the lateral ankle and has some calf tenderness.  No chest pain or shortness of breath currently.  No history of PE or DVT.  States it is painful to ambulate.   History provided by patient.    Past Medical History:  Diagnosis Date   Anxiety    Brain cancer (HCC)    Dyspnea    Endometriosis    Family history of ovarian cancer    Gastroesophageal reflux disease 09/20/2008   no problems since gallbladder taken out!! 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HCC)    Neuromuscular disorder (HCC)    Spontaneous abortion    Unspecified blood type, rh negative     Past Surgical History:  Procedure Laterality Date   BRAIN TUMOR EXCISION     CHOLECYSTECTOMY  2007   CYSTOSCOPY N/A 09/01/2018   Procedure: CYSTOSCOPY;  Surgeon: Leonce Garnette BIRCH, MD;  Location: [facility];  Service: Gynecology;  Laterality: N/A;   DIAGNOSTIC LAPAROSCOPY  04/26/2010   IUD EXPULSION/PELVIC PAIN   DILATION AND CURETTAGE OF UTERUS  08/10/10; 08/06/07   HYDATIIDIFORM MOLE/POC, POC   DIRECT LARYNGOSCOPY N/A 10/10/2020   Procedure: INJECTION LARYNGOPLASTY C VOICE GEL, LEFT ONE VOCAL FOLD;  Surgeon: Milissa Hamming, MD;  Location: [facility];  Service: ENT;  Laterality: N/A;   gallstones  08/05/2006   HYSTEROSCOPY  04/26/2010   IUD EXPULSION /PELVIC PAIN   LAPAROSCOPIC HYSTERECTOMY N/A 09/01/2018    Procedure: HYSTERECTOMY TOTAL LAPAROSCOPIC BILATERAL SALPINGECTOMY;  Surgeon: Leonce Garnette BIRCH, MD;  Location: [facility];  Service: Gynecology;  Laterality: N/A;   LAPAROSCOPY N/A 08/21/2017   Procedure: LAPAROSCOPY DIAGNOSTIC;  Surgeon: Leonce Garnette BIRCH, MD;  Location: [facility];  Service: Gynecology;  Laterality: N/A;   LAPAROSCOPY N/A 07/30/2018   Procedure: LAPAROSCOPY DIAGNOSTIC WITH BILATERAL SALPINGECTOMY;  Surgeon: Leonce Garnette BIRCH, MD;  Location: [facility];  Service: Gynecology;  Laterality: N/A;   SPINAL FUSION  2021   TUBAL LIGATION  01/2012   SDJ    MEDICATIONS:  Prior to Admission medications   Medication Sig Start Date End Date Taking? Authorizing Provider  hydrOXYzine  (ATARAX ) 10 MG tablet Take 1 tablet (10 mg total) by mouth 3 (three) times daily as needed for anxiety. Patient not taking: Reported on 02/20/2023 02/11/23   Akula, Vijaya, MD  naloxone [facility]) nasal spray 4 mg/0.1 mL Place 4 mg into the nose. 12/02/23   [provider]  oxyCODONE  (ROXICODONE ) 5 MG immediate release tablet Take 1 tablet (5 mg total) by mouth every 6 (six) hours as needed. 07/24/23 07/23/24  Charlene Debby BROCKS, PA-C  torsemide  (DEMADEX ) 20 MG tablet Take 1 tablet (20 mg total) by mouth daily. Patient not taking: Reported on 02/10/2024 02/11/23 02/11/24  Cherlyn Labella, MD  WEGOVY 0.25 MG/0.5ML [facility]  03/15/24   [provider]  Physical Exam   Triage Vital Signs: ED Triage Vitals  Encounter Vitals Group     BP 06/16/24 2157 (!) 133/91     Girls Systolic BP Percentile --      Girls Diastolic BP Percentile --      Boys Systolic BP Percentile --      Boys Diastolic BP Percentile --      Pulse Rate 06/16/24 2157 100     Resp 06/16/24 2157 18     Temp 06/16/24 2157 98.1 F (36.7 C)     Temp src --      SpO2 06/16/24 2157 98 %     Weight 06/16/24 2201 180 lb (81.6 kg)     Height 06/16/24 2201 5' 4 (1.626 m)     Head Circumference --      Peak Flow --      Pain Score 06/16/24  2201 7     Pain Loc --      Pain Education --      Exclude from Growth Chart --     Most recent vital signs: Vitals:   06/16/24 2157 06/17/24 0542  BP: (!) 133/91 124/85  Pulse: 100 96  Resp: 18 18  Temp: 98.1 F (36.7 C) 98 F (36.7 C)  SpO2: 98% 99%     CONSTITUTIONAL: Alert and responds appropriately to questions. Well-appearing; well-nourished HEAD: Normocephalic, atraumatic EYES: Conjunctivae clear, pupils appear equal ENT: normal nose; moist mucous membranes NECK: Normal range of motion CARD: Regular rate and rhythm, normal heart sounds RESP: Normal chest excursion without splinting or tachypnea; no hypoxia or respiratory distress, speaking full sentences, lungs clear to auscultation ABD/GI: non-distended EXT: Patient has mild soft tissue swelling and bruising noted to the lateral right ankle.  No bony deformity or joint effusion.  2+ right DP pulse.  She does have some right calf tenderness but no swelling, asymmetry compared to the left.  Normal capillary refill, sensation.  No tenderness at the proximal fibular head. SKIN: Normal color for age and race, no rashes on exposed skin NEURO: Moves all extremities equally, normal speech, no facial asymmetry noted PSYCH: The patient's mood and manner are appropriate. Grooming and personal hygiene are appropriate.  ED Results / Procedures / Treatments   LABS: (all labs ordered are listed, but only abnormal results are displayed) Labs Reviewed - No data to display   EKG:    RADIOLOGY: My personal review and interpretation of imaging: X-ray shows no fracture.  Ultrasound shows no DVT.  I have personally reviewed all radiology reports. US  Venous Img Lower Unilateral Right Result Date: 06/17/2024 CLINICAL DATA:  Right calf pain EXAM: RIGHT LOWER EXTREMITY VENOUS DOPPLER ULTRASOUND TECHNIQUE: Gray-scale sonography with compression, as well as color and duplex ultrasound, were performed to evaluate the deep venous system(s)  from the level of the common femoral vein through the popliteal and proximal calf veins. COMPARISON:  None Available. FINDINGS: VENOUS Normal compressibility of the common femoral, superficial femoral, and popliteal veins, as well as the visualized calf veins. Visualized portions of profunda femoral vein and great saphenous vein unremarkable. No filling defects to suggest DVT on grayscale or color Doppler imaging. Doppler waveforms show normal direction of venous flow, normal respiratory plasticity and response to augmentation. Limited views of the contralateral common femoral vein are unremarkable. OTHER None. Limitations: none IMPRESSION: Negative. Electronically Signed   By: Dorethia Molt M.D.   On: 06/17/2024 04:27   DG Ankle Complete Right Result Date: 06/16/2024 CLINICAL DATA:  Injury. Right ankle pain and swelling after injury of the water park today. EXAM: RIGHT ANKLE - COMPLETE 3+ VIEW COMPARISON:  None Available. FINDINGS: Old ununited ossicle inferior to the lateral malleolus. No evidence of acute fracture or dislocation of the right ankle. No focal bone lesion or bone destruction. Joint spaces are normal. Soft tissues are unremarkable. IMPRESSION: No acute bony abnormalities. Electronically Signed   By: Elsie Gravely M.D.   On: 06/16/2024 22:29     PROCEDURES:  Critical Care performed: No     Procedures    IMPRESSION / MDM / ASSESSMENT AND PLAN / ED COURSE  I reviewed the triage vital signs and the nursing notes.   Patient here with complaints of ankle pain, swelling and bruising.     DIFFERENTIAL DIAGNOSIS (includes but not limited to):   Ankle sprain, less likely fracture, DVT, no signs of arterial obstruction, septic arthritis, cellulitis, compartment syndrome, gout  Patient's presentation is most consistent with acute complicated illness / injury requiring diagnostic workup.  PLAN: X-ray of the right ankle obtained from triage and reviewed/interpreted by myself and  the radiologist and show no acute abnormality.  Will obtain venous Doppler given she does have calf tenderness on exam.  Patient drove herself to the emergency department with her 5 children.  Will give IM Toradol  here as she states she does not have anyone to drive her home.   MEDICATIONS GIVEN IN ED: Medications  lidocaine  (LIDODERM ) 5 % 1 patch (1 patch Transdermal Patch Applied 06/17/24 0541)  ketorolac  (TORADOL ) 30 MG/ML injection 60 mg (60 mg Intramuscular Given 06/17/24 0244)     ED COURSE: Venous Doppler reviewed and interpreted by myself and the radiologist and shows no DVT.  Patient still complaining of significant pain.  She still does not have anyone to drive her home.  Discussed with her that I cannot provide her with narcotic pain medication in the ED if she is driving due to [facility] health policy.  She tells nursing staff that she takes pain medication chronically and on review of her controlled substance reporting system data she does get oxycodone  prescriptions regularly.  Have recommended she follow-up with her pain management specialist if she feels that her oxycodone  is not controlling her pain.  Will place a Lidoderm  patch to this area to see if this helps with discomfort.  She is requesting that we put her ankle in a splint which I do not feel is clinically indicated.  Will place her in an Aircast.  Provided with crutches to help her with pain control but she can weight-bear as tolerated.  Recommended NSAIDs, ice, rest, elevation.  Will provide with work note.  Will give orthopedic follow-up if symptoms are not improving in 1 to 2 weeks with conservative management.   At this time, I do not feel there is any life-threatening condition present. I reviewed all nursing notes, vitals, pertinent previous records.  All lab and urine results, EKGs, imaging ordered have been independently reviewed and interpreted by myself.  I reviewed all available radiology reports from any imaging ordered  this visit.  Based on my assessment, I feel the patient is safe to be discharged home without further emergent workup and can continue workup as an outpatient as needed. Discussed all findings, treatment plan as well as usual and customary return precautions.  They verbalize understanding and are comfortable with this plan.  Outpatient follow-up has been provided as needed.  All questions have been answered.    CONSULTS:  none   OUTSIDE RECORDS REVIEWED: Reviewed last oncology note on 06/15/2024 and rheumatology note on 01/15/2024.     FINAL CLINICAL IMPRESSION(S) / ED DIAGNOSES   Final diagnoses:  Acute right ankle pain  Right calf pain     Rx / DC Orders   ED Discharge Orders          Ordered    Crutches  Status:  Canceled        06/17/24 0436    ibuprofen  (ADVIL ) 800 MG tablet  Every 8 hours PRN        06/17/24 0441             Note:  This document was prepared using Dragon voice recognition software and may include unintentional dictation errors.   Syanne Looney, Josette SAILOR, DO 06/17/24 [phone]

## 2024-09-15 ENCOUNTER — Ambulatory Visit (INDEPENDENT_AMBULATORY_CARE_PROVIDER_SITE_OTHER): Admitting: Nurse Practitioner
